# Patient Record
Sex: Male | Born: 1945
Health system: Southern US, Community
[De-identification: ages and names within clinical notes are randomized; demographics above are authoritative.]

## PROBLEM LIST (undated history)

## (undated) DIAGNOSIS — I10 Essential (primary) hypertension: Secondary | ICD-10-CM

## (undated) DIAGNOSIS — F329 Major depressive disorder, single episode, unspecified: Secondary | ICD-10-CM

## (undated) DIAGNOSIS — J449 Chronic obstructive pulmonary disease, unspecified: Secondary | ICD-10-CM

## (undated) DIAGNOSIS — R0600 Dyspnea, unspecified: Secondary | ICD-10-CM

## (undated) DIAGNOSIS — K219 Gastro-esophageal reflux disease without esophagitis: Secondary | ICD-10-CM

## (undated) DIAGNOSIS — F32A Depression, unspecified: Secondary | ICD-10-CM

## (undated) HISTORY — PX: EYE SURGERY: SHX253

## (undated) HISTORY — PX: BACK SURGERY: SHX140

## (undated) HISTORY — PX: SHOULDER ARTHROSCOPY: SHX128

---

## 2007-08-17 ENCOUNTER — Emergency Department (HOSPITAL_COMMUNITY): Admission: EM | Admit: 2007-08-17 | Discharge: 2007-08-18 | Payer: Self-pay | Admitting: Emergency Medicine

## 2007-08-20 ENCOUNTER — Encounter: Admission: RE | Admit: 2007-08-20 | Discharge: 2007-08-20 | Payer: Self-pay | Admitting: Orthopedic Surgery

## 2010-12-09 ENCOUNTER — Other Ambulatory Visit: Payer: Self-pay | Admitting: Internal Medicine

## 2010-12-09 DIAGNOSIS — K219 Gastro-esophageal reflux disease without esophagitis: Secondary | ICD-10-CM

## 2010-12-22 ENCOUNTER — Ambulatory Visit
Admission: RE | Admit: 2010-12-22 | Discharge: 2010-12-22 | Disposition: A | Discharge status: Home / Self Care | Payer: 59 | Source: Ambulatory Visit | Attending: Internal Medicine | Admitting: Internal Medicine

## 2010-12-22 DIAGNOSIS — K219 Gastro-esophageal reflux disease without esophagitis: Secondary | ICD-10-CM

## 2011-02-06 ENCOUNTER — Ambulatory Visit
Admission: RE | Admit: 2011-02-06 | Discharge: 2011-02-06 | Disposition: A | Discharge status: Home / Self Care | Payer: 59 | Source: Ambulatory Visit | Attending: Internal Medicine | Admitting: Internal Medicine

## 2011-02-06 ENCOUNTER — Other Ambulatory Visit: Payer: Self-pay | Admitting: Internal Medicine

## 2011-02-06 DIAGNOSIS — R109 Unspecified abdominal pain: Secondary | ICD-10-CM

## 2012-05-13 ENCOUNTER — Other Ambulatory Visit: Payer: Self-pay | Admitting: Gastroenterology

## 2012-06-06 ENCOUNTER — Encounter (HOSPITAL_COMMUNITY): Payer: Self-pay | Admitting: *Deleted

## 2012-06-07 NOTE — Progress Notes (Signed)
cmet dr Windy Fast polite Deboraha Sprang 05-30-2012 on chart

## 2012-06-11 ENCOUNTER — Encounter (HOSPITAL_COMMUNITY): Payer: Self-pay | Admitting: Pharmacy Technician

## 2012-06-25 ENCOUNTER — Ambulatory Visit (HOSPITAL_COMMUNITY)
Admission: RE | Admit: 2012-06-25 | Discharge: 2012-06-25 | Disposition: A | Discharge status: Home / Self Care | Payer: Medicare HMO | Source: Ambulatory Visit | Attending: Gastroenterology | Admitting: Gastroenterology

## 2012-06-25 ENCOUNTER — Encounter (HOSPITAL_COMMUNITY): Payer: Self-pay | Admitting: Anesthesiology

## 2012-06-25 ENCOUNTER — Ambulatory Visit (HOSPITAL_COMMUNITY): Payer: Medicare HMO | Admitting: Anesthesiology

## 2012-06-25 ENCOUNTER — Encounter (HOSPITAL_COMMUNITY)
Admission: RE | Disposition: A | Discharge status: Home / Self Care | Payer: Self-pay | Source: Ambulatory Visit | Attending: Gastroenterology

## 2012-06-25 ENCOUNTER — Encounter (HOSPITAL_COMMUNITY): Payer: Self-pay | Admitting: *Deleted

## 2012-06-25 DIAGNOSIS — J4489 Other specified chronic obstructive pulmonary disease: Secondary | ICD-10-CM | POA: Insufficient documentation

## 2012-06-25 DIAGNOSIS — K219 Gastro-esophageal reflux disease without esophagitis: Secondary | ICD-10-CM | POA: Insufficient documentation

## 2012-06-25 DIAGNOSIS — F329 Major depressive disorder, single episode, unspecified: Secondary | ICD-10-CM | POA: Insufficient documentation

## 2012-06-25 DIAGNOSIS — D126 Benign neoplasm of colon, unspecified: Secondary | ICD-10-CM | POA: Insufficient documentation

## 2012-06-25 DIAGNOSIS — I10 Essential (primary) hypertension: Secondary | ICD-10-CM | POA: Insufficient documentation

## 2012-06-25 DIAGNOSIS — G8929 Other chronic pain: Secondary | ICD-10-CM | POA: Insufficient documentation

## 2012-06-25 DIAGNOSIS — R109 Unspecified abdominal pain: Secondary | ICD-10-CM | POA: Insufficient documentation

## 2012-06-25 DIAGNOSIS — F3289 Other specified depressive episodes: Secondary | ICD-10-CM | POA: Insufficient documentation

## 2012-06-25 DIAGNOSIS — J449 Chronic obstructive pulmonary disease, unspecified: Secondary | ICD-10-CM | POA: Insufficient documentation

## 2012-06-25 DIAGNOSIS — F17200 Nicotine dependence, unspecified, uncomplicated: Secondary | ICD-10-CM | POA: Insufficient documentation

## 2012-06-25 DIAGNOSIS — R12 Heartburn: Secondary | ICD-10-CM | POA: Insufficient documentation

## 2012-06-25 HISTORY — DX: Gastro-esophageal reflux disease without esophagitis: K21.9

## 2012-06-25 HISTORY — PX: ESOPHAGOGASTRODUODENOSCOPY (EGD) WITH PROPOFOL: SHX5813

## 2012-06-25 HISTORY — DX: Essential (primary) hypertension: I10

## 2012-06-25 HISTORY — PX: COLONOSCOPY WITH PROPOFOL: SHX5780

## 2012-06-25 HISTORY — DX: Chronic obstructive pulmonary disease, unspecified: J44.9

## 2012-06-25 HISTORY — DX: Depression, unspecified: F32.A

## 2012-06-25 HISTORY — DX: Major depressive disorder, single episode, unspecified: F32.9

## 2012-06-25 SURGERY — COLONOSCOPY WITH PROPOFOL
Anesthesia: Monitor Anesthesia Care

## 2012-06-25 MED ORDER — GLYCOPYRROLATE 0.2 MG/ML IJ SOLN
INTRAMUSCULAR | Status: DC | PRN
  Administered 2012-06-25: 0.2 mg via INTRAVENOUS

## 2012-06-25 MED ORDER — BUTAMBEN-TETRACAINE-BENZOCAINE 2-2-14 % EX AERO
INHALATION_SPRAY | CUTANEOUS | Status: DC | PRN
  Administered 2012-06-25: 1 via TOPICAL

## 2012-06-25 MED ORDER — KETAMINE HCL 10 MG/ML IJ SOLN
INTRAMUSCULAR | Status: DC | PRN
  Administered 2012-06-25: 20 mg via INTRAVENOUS

## 2012-06-25 MED ORDER — PROPOFOL INFUSION 10 MG/ML OPTIME
INTRAVENOUS | Status: DC | PRN
  Administered 2012-06-25: 140 ug/kg/min via INTRAVENOUS

## 2012-06-25 MED ORDER — LACTATED RINGERS IV SOLN
INTRAVENOUS | Status: DC | PRN
  Administered 2012-06-25 (×2): via INTRAVENOUS

## 2012-06-25 MED ORDER — SODIUM CHLORIDE 0.9 % IV SOLN: INTRAVENOUS | Status: DC

## 2012-06-25 MED ORDER — LACTATED RINGERS IV SOLN
INTRAVENOUS | Status: DC
  Administered 2012-06-25: 1000 mL via INTRAVENOUS

## 2012-06-25 MED ORDER — PROPOFOL 10 MG/ML IV EMUL
INTRAVENOUS | Status: DC | PRN
  Administered 2012-06-25: 40 mg via INTRAVENOUS

## 2012-06-25 SURGICAL SUPPLY — 24 items

## 2012-06-25 NOTE — Op Note (Signed)
Procedure: Diagnostic esophagogastroduodenoscopy to evaluate chronic abdominal pain and heartburn  Endoscopist: Danise Edge  Premedication: Propofol administered by anesthesia  Procedure: The patient was placed in the left lateral decubitus position. The Pentax gastroscope was passed through the posterior hypopharynx into the proximal esophagus without difficulty. The hypopharynx, larynx, and vocal cords appeared normal.  Esophagoscopy: The proximal, mid, and lower segments of the esophageal mucosa appear normal. The squamocolumnar junction is noted at approximately 43 cm from the incisor teeth. There is no endoscopic evidence for the presence of erosive esophagitis or Barrett's esophagus.  Gastroscopy: Retroflex view of the gastric cardia and fundus was normal. The gastric body, antrum, and pylorus appeared normal. Random gastric biopsies were performed to look for H. pylori gastritis.  Duodenoscopy: The duodenal bulb and descending duodenum appeared normal.  Assessment: Normal esophagogastroduodenoscopy. Gastric biopsies to look for H. pylori gastritis pending.  Procedure: Diagnostic colonoscopy  Endoscopist: Danise Edge  Premedication: Propofol administered by anesthesia  Procedure: Anal inspection and digital rectal exam were normal. The Pentax pediatric colonoscope was introduced into the rectum and advanced to the cecum. A normal-appearing ileocecal valve and appendiceal orifice were identified. Colonic preparation for the exam today was good.  Rectum. Normal. Retroflex view of the distal rectum normal.  Sigmoid colon. From the mid sigmoid colon, a 7 mm sessile polyp was removed with the hot snare and submitted for pathologic interpretation.  Descending colon. Normal.  Splenic flexure. Normal.  Transverse colon. Normal.  Hepatic flexure. Normal.  Ascending colon. From the proximal ascending colon, a 7 mm sessile polyp was removed in piecemeal fashion with the cold  snare and submitted for pathologic interpretation.  Cecum and ileocecal valve. Normal.  Assessment:  #1. From the proximal ascending colon, a 7 mm sessile polyp was removed with the cold snare  #2. From the mid sigmoid colon, a 7 mm sessile polyp was removed with the hot snare   Recommendations: Await pathology report.

## 2012-06-25 NOTE — Transfer of Care (Signed)
Immediate Anesthesia Transfer of Care Note  Patient: Lee Leblanc  Procedure(s) Performed: Procedure(s): COLONOSCOPY WITH PROPOFOL (N/A) ESOPHAGOGASTRODUODENOSCOPY (EGD) WITH PROPOFOL (N/A)  Patient Location: PACU  Anesthesia Type:MAC  Level of Consciousness: awake, alert  and oriented  Airway & Oxygen Therapy: Patient Spontanous Breathing and Patient connected to nasal cannula oxygen  Post-op Assessment: Report given to PACU RN and Post -op Vital signs reviewed and stable  Post vital signs: Reviewed and stable  Complications: No apparent anesthesia complications

## 2012-06-25 NOTE — Preoperative (Signed)
Beta Blockers   Reason not to administer Beta Blockers:Not Applicable 

## 2012-06-25 NOTE — Anesthesia Preprocedure Evaluation (Addendum)
Anesthesia Evaluation  Patient identified by MRN, date of birth, ID band Patient awake    Reviewed: Allergy & Precautions, H&P , NPO status , Patient's Chart, lab work & pertinent test results  Airway Mallampati: II TM Distance: >3 FB Neck ROM: Full    Dental  (+) Edentulous Upper and Missing   Pulmonary COPDCurrent Smoker,  breath sounds clear to auscultation  Pulmonary exam normal       Cardiovascular Exercise Tolerance: Good hypertension, Pt. on medications Rhythm:Regular Rate:Normal     Neuro/Psych PSYCHIATRIC DISORDERS Depression Chronic back pain. Uses ms contin.    GI/Hepatic Neg liver ROS, GERD-  ,Chronic abdominal pain, heartburn, constipation.   Endo/Other  negative endocrine ROS  Renal/GU negative Renal ROS  negative genitourinary   Musculoskeletal negative musculoskeletal ROS (+)   Abdominal   Peds negative pediatric ROS (+)  Hematology negative hematology ROS (+)   Anesthesia Other Findings   Reproductive/Obstetrics negative OB ROS                          Anesthesia Physical Anesthesia Plan  ASA: II  Anesthesia Plan: MAC   Post-op Pain Management:    Induction: Intravenous  Airway Management Planned:   Additional Equipment:   Intra-op Plan:   Post-operative Plan:   Informed Consent: I have reviewed the patients History and Physical, chart, labs and discussed the procedure including the risks, benefits and alternatives for the proposed anesthesia with the patient or authorized representative who has indicated his/her understanding and acceptance.   Dental advisory given  Plan Discussed with: CRNA  Anesthesia Plan Comments:         Anesthesia Quick Evaluation

## 2012-06-25 NOTE — Anesthesia Postprocedure Evaluation (Signed)
  Anesthesia Post-op Note  Patient: Lee Leblanc  Procedure(s) Performed: Procedure(s) (LRB): COLONOSCOPY WITH PROPOFOL (N/A) ESOPHAGOGASTRODUODENOSCOPY (EGD) WITH PROPOFOL (N/A)  Patient Location: PACU  Anesthesia Type: MAC  Level of Consciousness: awake and alert   Airway and Oxygen Therapy: Patient Spontanous Breathing  Post-op Pain: mild  Post-op Assessment: Post-op Vital signs reviewed, Patient's Cardiovascular Status Stable, Respiratory Function Stable, Patent Airway and No signs of Nausea or vomiting  Last Vitals:  Filed Vitals:   06/25/12 1226  BP: 157/80  Pulse:   Temp:   Resp: 17    Post-op Vital Signs: stable   Complications: No apparent anesthesia complications

## 2012-06-25 NOTE — H&P (Signed)
  Problems: Chronic abdominal pain, heartburn, constipation  History: The patient is a 67 year old male born [birth year]. The patient has been experiencing postprandial periumbilical and upper abdominal discomfort associated with heartburn, constipation, and water brash. He denies gastrointestinal bleeding, dysphagia, odynophagia, or vomiting. He denies weight loss.  In October 2003, the patient underwent a normal esophagogastroduodenoscopy and screening colonoscopy. In 2006, he underwent an incomplete colonoscopy; I do not have a colonoscopy report. In 2006, he underwent a normal single contrast barium enema which did show left colonic diverticulosis. In November 2012, the patient underwent a normal barium esophagram. In December 2012, CT scan of the abdomen and pelvis showed bilateral pleural calcifications consistent with asbestos exposure, a right inguinal hernia, and right kidney cyst.  The patient is monitored on a yearly basis by his pulmonary specialist for his pleural effusions.  The patient is scheduled to undergo a diagnostic esophagogastroduodenoscopy with performance of gastric biopsies to rule out H. pyloric gastritis and also undergo a diagnostic/screening colonoscopy.  Chronic medications: Albuterol. Aspirin. Advair. Citalopram. Losartan. Hydrochlorothiazide.  MS Contin.  Past medical and surgical history: Hypertension. Chronic pain. Benign prostatic hypertrophy. Depression. Chronic obstructive pulmonary disease associated with tobacco use. Chronic insomnia. Chronic back pain following multiple back surgeries.  Medication allergies: None  Habits: The patient continues to smoke cigarettes and consumes alcohol in moderation.  Exam: The patient is alert and lying comfortably on the endoscopy stretcher. Lungs are clear to auscultation. Cardiac exam reveals a regular rhythm. Abdomen is soft, flat, and nontender to palpation in all quadrants.  Plan: proceed with diagnostic  esophagogastroduodenoscopy with performance of gastric biopsies and diagnostic colonoscopy to evaluate chronic abdominal pain, heartburn, and constipation.

## 2012-06-26 ENCOUNTER — Encounter (HOSPITAL_COMMUNITY): Payer: Self-pay | Admitting: Gastroenterology

## 2013-06-13 ENCOUNTER — Other Ambulatory Visit (HOSPITAL_COMMUNITY): Payer: Self-pay

## 2013-06-13 DIAGNOSIS — J441 Chronic obstructive pulmonary disease with (acute) exacerbation: Secondary | ICD-10-CM

## 2013-06-20 ENCOUNTER — Ambulatory Visit (HOSPITAL_COMMUNITY)
Admission: RE | Admit: 2013-06-20 | Discharge: 2013-06-20 | Disposition: A | Discharge status: Home / Self Care | Payer: Medicare HMO | Source: Ambulatory Visit | Attending: Internal Medicine | Admitting: Internal Medicine

## 2013-06-20 DIAGNOSIS — J4489 Other specified chronic obstructive pulmonary disease: Secondary | ICD-10-CM | POA: Insufficient documentation

## 2013-06-20 DIAGNOSIS — J449 Chronic obstructive pulmonary disease, unspecified: Secondary | ICD-10-CM | POA: Insufficient documentation

## 2013-06-20 MED ORDER — ALBUTEROL SULFATE (2.5 MG/3ML) 0.083% IN NEBU
2.5000 mg | INHALATION_SOLUTION | Freq: Once | RESPIRATORY_TRACT | Status: AC
  Administered 2013-06-20: 2.5 mg via RESPIRATORY_TRACT

## 2013-06-23 ENCOUNTER — Other Ambulatory Visit: Payer: Self-pay | Admitting: Internal Medicine

## 2013-06-23 ENCOUNTER — Ambulatory Visit
Admission: RE | Admit: 2013-06-23 | Discharge: 2013-06-23 | Disposition: A | Discharge status: Home / Self Care | Payer: Commercial Managed Care - HMO | Source: Ambulatory Visit | Attending: Internal Medicine | Admitting: Internal Medicine

## 2013-06-23 DIAGNOSIS — R0602 Shortness of breath: Secondary | ICD-10-CM

## 2013-06-24 ENCOUNTER — Other Ambulatory Visit: Payer: Self-pay | Admitting: Internal Medicine

## 2013-06-24 DIAGNOSIS — J61 Pneumoconiosis due to asbestos and other mineral fibers: Secondary | ICD-10-CM

## 2013-06-24 DIAGNOSIS — R9389 Abnormal findings on diagnostic imaging of other specified body structures: Secondary | ICD-10-CM

## 2013-06-26 LAB — PULMONARY FUNCTION TEST
DL/VA % pred: 71 %
DL/VA: 3.41 ml/min/mmHg/L
DLCO COR % PRED: 67 %
DLCO COR: 24.67 ml/min/mmHg
DLCO unc % pred: 67 %
DLCO unc: 24.67 ml/min/mmHg
FEF 25-75 POST: 3.13 L/s
FEF 25-75 PRE: 2.41 L/s
FEF2575-%CHANGE-POST: 29 %
FEF2575-%PRED-POST: 109 %
FEF2575-%PRED-PRE: 84 %
FEV1-%Change-Post: 5 %
FEV1-%PRED-PRE: 99 %
FEV1-%Pred-Post: 105 %
FEV1-POST: 3.48 L
FEV1-PRE: 3.29 L
FEV1FVC-%CHANGE-POST: 6 %
FEV1FVC-%PRED-PRE: 96 %
FEV6-%CHANGE-POST: 0 %
FEV6-%PRED-POST: 104 %
FEV6-%Pred-Pre: 105 %
FEV6-PRE: 4.37 L
FEV6-Post: 4.35 L
FEV6FVC-%Change-Post: 1 %
FEV6FVC-%Pred-Post: 103 %
FEV6FVC-%Pred-Pre: 102 %
FVC-%Change-Post: -1 %
FVC-%PRED-POST: 101 %
FVC-%Pred-Pre: 102 %
FVC-POST: 4.38 L
FVC-Pre: 4.43 L
POST FEV1/FVC RATIO: 79 %
POST FEV6/FVC RATIO: 100 %
PRE FEV1/FVC RATIO: 74 %
Pre FEV6/FVC Ratio: 99 %
RV % pred: 112 %
RV: 2.89 L
TLC % PRED: 98 %
TLC: 7.51 L

## 2013-06-27 ENCOUNTER — Ambulatory Visit
Admission: RE | Admit: 2013-06-27 | Discharge: 2013-06-27 | Disposition: A | Discharge status: Home / Self Care | Payer: Commercial Managed Care - HMO | Source: Ambulatory Visit | Attending: Internal Medicine | Admitting: Internal Medicine

## 2013-06-27 ENCOUNTER — Encounter (INDEPENDENT_AMBULATORY_CARE_PROVIDER_SITE_OTHER): Payer: Self-pay

## 2013-06-27 DIAGNOSIS — R9389 Abnormal findings on diagnostic imaging of other specified body structures: Secondary | ICD-10-CM

## 2013-06-27 DIAGNOSIS — J61 Pneumoconiosis due to asbestos and other mineral fibers: Secondary | ICD-10-CM

## 2013-07-02 ENCOUNTER — Other Ambulatory Visit: Payer: Self-pay | Admitting: Internal Medicine

## 2013-07-02 DIAGNOSIS — J61 Pneumoconiosis due to asbestos and other mineral fibers: Secondary | ICD-10-CM

## 2013-07-02 DIAGNOSIS — J438 Other emphysema: Secondary | ICD-10-CM

## 2013-07-02 DIAGNOSIS — R911 Solitary pulmonary nodule: Secondary | ICD-10-CM

## 2014-01-05 ENCOUNTER — Ambulatory Visit
Admission: RE | Admit: 2014-01-05 | Discharge: 2014-01-05 | Disposition: A | Discharge status: Home / Self Care | Payer: Commercial Managed Care - HMO | Source: Ambulatory Visit | Attending: Internal Medicine | Admitting: Internal Medicine

## 2014-01-05 DIAGNOSIS — J61 Pneumoconiosis due to asbestos and other mineral fibers: Secondary | ICD-10-CM

## 2014-01-05 DIAGNOSIS — J438 Other emphysema: Secondary | ICD-10-CM

## 2014-01-05 DIAGNOSIS — R911 Solitary pulmonary nodule: Secondary | ICD-10-CM

## 2014-01-13 ENCOUNTER — Ambulatory Visit (INDEPENDENT_AMBULATORY_CARE_PROVIDER_SITE_OTHER): Payer: Commercial Managed Care - HMO | Admitting: Internal Medicine

## 2014-01-13 ENCOUNTER — Encounter: Payer: Self-pay | Admitting: Internal Medicine

## 2014-01-13 ENCOUNTER — Other Ambulatory Visit (INDEPENDENT_AMBULATORY_CARE_PROVIDER_SITE_OTHER): Payer: Commercial Managed Care - HMO

## 2014-01-13 VITALS — BP 154/90 | HR 64 | Ht 74.0 in | Wt 219.0 lb

## 2014-01-13 DIAGNOSIS — R0600 Dyspnea, unspecified: Secondary | ICD-10-CM

## 2014-01-13 DIAGNOSIS — R911 Solitary pulmonary nodule: Secondary | ICD-10-CM

## 2014-01-13 DIAGNOSIS — J929 Pleural plaque without asbestos: Secondary | ICD-10-CM

## 2014-01-13 LAB — BRAIN NATRIURETIC PEPTIDE: PRO B NATRI PEPTIDE: 53 pg/mL (ref 0.0–100.0)

## 2014-01-13 LAB — TSH: TSH: 1.07 u[IU]/mL (ref 0.35–4.50)

## 2014-01-13 NOTE — Patient Instructions (Signed)
Pace yourself better so you don't get out of breath when you walk  Please remember to go to the lab   department downstairs for your tests - we will call you with the results when they are available.  I will be in touch with Dr Delfina Redwood re further testing but you do not have copd and ok to stop the symbicort as you don't think it helped your breathing

## 2014-01-13 NOTE — Progress Notes (Signed)
Subjective:    Patient ID: Lee Leblanc, male    DOB: 10-09-1945   MRN: [id number]  HPI  54  yobm  Variably "stopped smoking" 2013 @ wt 180  with onset doe worse since quit smoking assoc with 40 lb of wt gain   so referred by Dr Delfina Redwood to pulmonary clinic 01/13/2014  With doucmented nl pfts 06/20/13 but prior asbestos exp with pleural plaques noted on CTs since 02/06/11   01/13/2014 1st [facility] office visit/ Amandeep Hogston   Chief Complaint  Patient presents with  . Pulmonary Consult    Referred by Dr. Delfina Redwood. Pt c/o DOE for the past 2-3 yrs "worse lately".  He states that he gets winded walking approx 15 ft at a fast pace.     not really much better on symbicort- main sob occurs after goes to bathroom nightly then tries to lie back down, does not occur in am if goes to bathroom and stays up and only reproducible again if really gets in a hurry/ assoc with sign wt gain since onset of sob which was indolent and minimally progressive   No obvious other patterns in day to day or daytime variabilty or assoc  cough or cp or chest tightness, subjective wheeze overt sinus or hb symptoms. No unusual exp hx or h/o childhood pna/ asthma or knowledge of premature birth.  Sleeping ok without nocturnal  or early am exacerbation  of respiratory  c/o's or need for noct saba. Also denies any obvious fluctuation of symptoms with weather or environmental changes or other aggravating or alleviating factors except as outlined above   Current Medications, Allergies, Complete Past Medical History, Past Surgical History, Family History, and Social History were reviewed in [employer] record.           Review of Systems  Constitutional: Positive for appetite change. Negative for fever, chills, activity change and unexpected weight change.  HENT: Positive for congestion, dental problem and trouble swallowing. Negative for postnasal drip, rhinorrhea, sneezing, sore throat and voice change.     Eyes: Negative for visual disturbance.  Respiratory: Positive for shortness of breath. Negative for cough and choking.   Cardiovascular: Negative for chest pain and leg swelling.  Gastrointestinal: Negative for nausea, vomiting and abdominal pain.  Genitourinary: Negative for difficulty urinating.       Indigestion  Musculoskeletal: Positive for arthralgias.  Skin: Positive for rash.  Psychiatric/Behavioral: Negative for behavioral problems and confusion.       Objective:   Physical Exam  amb bm nad   Wt Readings from Last 3 Encounters:  01/13/14 219 lb (99.338 kg)  06/25/12 192 lb (87.091 kg)    Vital signs reviewed   HEENT:  top dentures/ nl turbinates, and orophanx. Nl external ear canals without cough reflex   NECK :  without JVD/Nodes/TM/ nl carotid upstrokes bilaterally   LUNGS: no acc muscle use, clear to A and P bilaterally without cough on insp or exp maneuvers   CV:  RRR  no s3 or murmur or increase in P2, no edema   ABD:  soft and nontender with nl excursion in the supine position. No bruits or organomegaly, bowel sounds nl  MS:  warm without deformities, calf tenderness, cyanosis or clubbing  SKIN: warm and dry without lesions    NEURO:  alert, approp, no deficits     ct chest s contrast  01/05/14 There is underlying centrilobular type emphysema. There is bullous disease in both apices. There are  multiple pleural calcifications as well as calcification along each hemidiaphragm consistent with underlying emphysematous change, also noted previously. Plus 6 mm SPN   01/08/14 labs from Dr Polite's office HC03 =32 Hct 44.6 WBC 6.4 with 7% eos    Lab Results  Component Value Date   TSH 1.07 01/13/2014     Lab Results  Component Value Date   PROBNP 53.0 01/13/2014             Assessment & Plan:

## 2014-01-14 ENCOUNTER — Telehealth: Payer: Self-pay | Admitting: Internal Medicine

## 2014-01-14 NOTE — Telephone Encounter (Signed)
Notes Recorded by Tanda Rockers, MD on 01/13/2014 at 5:04 PM Call patient : Study is unremarkable, no change in recs ---------------------------------------------------------  Made pt aware of TSH results.  Nothing further needed at this time.

## 2014-01-14 NOTE — Progress Notes (Signed)
Quick Note:  LMTCB ______ 

## 2014-01-14 NOTE — Progress Notes (Signed)
Quick Note:  Pt aware of results. ______ 

## 2014-01-15 ENCOUNTER — Telehealth: Payer: Self-pay | Admitting: *Deleted

## 2014-01-15 DIAGNOSIS — R911 Solitary pulmonary nodule: Secondary | ICD-10-CM | POA: Insufficient documentation

## 2014-01-15 DIAGNOSIS — J929 Pleural plaque without asbestos: Secondary | ICD-10-CM | POA: Insufficient documentation

## 2014-01-15 NOTE — Telephone Encounter (Signed)
-----   Message from Tanda Rockers, MD sent at 01/15/2014  8:35 AM EST ----- After chart review rec add pepcid trial 20 mg at bedtime No mint/menthol/ chocolate/oil containing vitamins and eat smaller meal at supper earlier in day to see if helps noct problems and return to Dr Delfina Redwood p holidays to regroup  (I sent him additoinal recs to consider if above doesn't help)

## 2014-01-15 NOTE — Assessment & Plan Note (Addendum)
-   PFT's 06/20/13 no airflow obst, no better on symbicort > d/c 01/12/14  -  01/13/2014  Walked RA x 3 laps @ 185 ft each stopped due to min sob/ no desat/ nl to fast pace   Symptoms are markedly disproportionate to objective findings and not clear this is a lung problem but pt does appear to have difficult airway management issues.  DDX of  difficult airways management all start with A and  include Adherence, Ace Inhibitors, Acid Reflux, Active Sinus Disease, Alpha 1 Antitripsin deficiency, Anxiety masquerading as Airways dz,  ABPA,  allergy(esp in young), Aspiration (esp in elderly), Adverse effects of DPI,  Active smokers, plus two Bs  = Bronchiectasis and Beta blocker use..and one C= CHF   ? Anxiety > can't explain why he has nightly "orthopnea" only after walks to [place] and back when pnd does not wake him up and bnp is nl but anxiety could certainly explain it as could effect of obesity on his diaphragm (note the high bicarb on bmet is suggestive of incipient ohs)> consider sleep study to sort out  ? Acid reflux noct > rec try pepcid 20 mg at hs   ? Active smoking > he denies it now but variably reports smoking > reinforced abstinence   No evidence at all of asthma or copd so ok to stop symbicort   Pulmonary f/u is prn

## 2014-01-15 NOTE — Assessment & Plan Note (Signed)
He has no asbestosis but is at risk and note needs f/u CT chest in one year anyway so that's all i would rec for now  Stopping smoking permanently is critical in this setting

## 2014-01-15 NOTE — Telephone Encounter (Signed)
Spoke with the pt and notified of recs per MW  He verbalized understanding and denied any questions

## 2014-01-15 NOTE — Assessment & Plan Note (Signed)
See CT chest Jun 27 2013 vs 01/05/14 = 6 mm sup segment RLL . rec f/u 01/06/15 per Fleischner Society recommendations for incidental pulmonary nodules   Placed in tickle

## 2014-05-25 ENCOUNTER — Emergency Department (HOSPITAL_COMMUNITY): Payer: Commercial Managed Care - HMO

## 2014-05-25 ENCOUNTER — Encounter (HOSPITAL_COMMUNITY): Payer: Self-pay | Admitting: Emergency Medicine

## 2014-05-25 DIAGNOSIS — J439 Emphysema, unspecified: Secondary | ICD-10-CM | POA: Diagnosis not present

## 2014-05-25 DIAGNOSIS — Z87891 Personal history of nicotine dependence: Secondary | ICD-10-CM | POA: Insufficient documentation

## 2014-05-25 DIAGNOSIS — R911 Solitary pulmonary nodule: Secondary | ICD-10-CM | POA: Diagnosis not present

## 2014-05-25 DIAGNOSIS — Z77090 Contact with and (suspected) exposure to asbestos: Secondary | ICD-10-CM | POA: Diagnosis not present

## 2014-05-25 DIAGNOSIS — I10 Essential (primary) hypertension: Secondary | ICD-10-CM | POA: Diagnosis not present

## 2014-05-25 DIAGNOSIS — E876 Hypokalemia: Secondary | ICD-10-CM | POA: Diagnosis not present

## 2014-05-25 DIAGNOSIS — Z806 Family history of leukemia: Secondary | ICD-10-CM | POA: Diagnosis not present

## 2014-05-25 DIAGNOSIS — N289 Disorder of kidney and ureter, unspecified: Secondary | ICD-10-CM | POA: Diagnosis not present

## 2014-05-25 DIAGNOSIS — Z7982 Long term (current) use of aspirin: Secondary | ICD-10-CM | POA: Insufficient documentation

## 2014-05-25 DIAGNOSIS — I309 Acute pericarditis, unspecified: Secondary | ICD-10-CM | POA: Diagnosis not present

## 2014-05-25 DIAGNOSIS — K219 Gastro-esophageal reflux disease without esophagitis: Secondary | ICD-10-CM | POA: Insufficient documentation

## 2014-05-25 DIAGNOSIS — R0602 Shortness of breath: Secondary | ICD-10-CM | POA: Diagnosis not present

## 2014-05-25 DIAGNOSIS — Z8042 Family history of malignant neoplasm of prostate: Secondary | ICD-10-CM | POA: Insufficient documentation

## 2014-05-25 DIAGNOSIS — F329 Major depressive disorder, single episode, unspecified: Secondary | ICD-10-CM | POA: Diagnosis not present

## 2014-05-25 DIAGNOSIS — R079 Chest pain, unspecified: Secondary | ICD-10-CM | POA: Diagnosis not present

## 2014-05-25 DIAGNOSIS — J449 Chronic obstructive pulmonary disease, unspecified: Secondary | ICD-10-CM | POA: Diagnosis not present

## 2014-05-25 DIAGNOSIS — R0789 Other chest pain: Secondary | ICD-10-CM | POA: Diagnosis not present

## 2014-05-25 LAB — BASIC METABOLIC PANEL
Anion gap: 9 (ref 5–15)
BUN: 13 mg/dL (ref 6–23)
CALCIUM: 9.1 mg/dL (ref 8.4–10.5)
CO2: 23 mmol/L (ref 19–32)
Chloride: 107 mmol/L (ref 96–112)
Creatinine, Ser: 1.31 mg/dL (ref 0.50–1.35)
GFR calc Af Amer: 63 mL/min — ABNORMAL LOW (ref >90)
GFR calc non Af Amer: 54 mL/min — ABNORMAL LOW (ref >90)
Glucose, Bld: 87 mg/dL (ref 70–99)
Potassium: 3.4 mmol/L — ABNORMAL LOW (ref 3.5–5.1)
SODIUM: 139 mmol/L (ref 135–145)

## 2014-05-25 LAB — CBC
HCT: 39.6 % (ref 39.0–52.0)
Hemoglobin: 13.8 g/dL (ref 13.0–17.0)
MCH: 30.7 pg (ref 26.0–34.0)
MCHC: 34.8 g/dL (ref 30.0–36.0)
MCV: 88 fL (ref 78.0–100.0)
Platelets: 299 10*3/uL (ref 150–400)
RBC: 4.5 MIL/uL (ref 4.22–5.81)
RDW: 14.4 % (ref 11.5–15.5)
WBC: 6.2 10*3/uL (ref 4.0–10.5)

## 2014-05-25 LAB — BRAIN NATRIURETIC PEPTIDE: B Natriuretic Peptide: 27.1 pg/mL (ref 0.0–100.0)

## 2014-05-25 LAB — I-STAT TROPONIN, ED: TROPONIN I, POC: 0 ng/mL (ref 0.00–0.08)

## 2014-05-25 NOTE — ED Notes (Signed)
Pt. reports intermittent left chest pain with SOB onset this afternoon , received  NTG 1 sl prior to arrival with slight relief. Denies nausea or diaphoresis .

## 2014-05-26 ENCOUNTER — Observation Stay (HOSPITAL_COMMUNITY): Payer: Commercial Managed Care - HMO

## 2014-05-26 ENCOUNTER — Observation Stay (HOSPITAL_COMMUNITY)
Admission: EM | Admit: 2014-05-26 | Discharge: 2014-05-27 | Disposition: A | Discharge status: Home / Self Care | Payer: Commercial Managed Care - HMO | Attending: Internal Medicine | Admitting: Internal Medicine

## 2014-05-26 DIAGNOSIS — R911 Solitary pulmonary nodule: Secondary | ICD-10-CM | POA: Diagnosis not present

## 2014-05-26 DIAGNOSIS — J929 Pleural plaque without asbestos: Secondary | ICD-10-CM | POA: Diagnosis not present

## 2014-05-26 DIAGNOSIS — I10 Essential (primary) hypertension: Secondary | ICD-10-CM | POA: Diagnosis not present

## 2014-05-26 DIAGNOSIS — E876 Hypokalemia: Secondary | ICD-10-CM | POA: Diagnosis not present

## 2014-05-26 DIAGNOSIS — N289 Disorder of kidney and ureter, unspecified: Secondary | ICD-10-CM | POA: Diagnosis not present

## 2014-05-26 DIAGNOSIS — R0609 Other forms of dyspnea: Secondary | ICD-10-CM | POA: Diagnosis not present

## 2014-05-26 DIAGNOSIS — K219 Gastro-esophageal reflux disease without esophagitis: Secondary | ICD-10-CM | POA: Diagnosis not present

## 2014-05-26 DIAGNOSIS — J439 Emphysema, unspecified: Secondary | ICD-10-CM | POA: Diagnosis present

## 2014-05-26 DIAGNOSIS — R079 Chest pain, unspecified: Secondary | ICD-10-CM | POA: Diagnosis present

## 2014-05-26 DIAGNOSIS — R0600 Dyspnea, unspecified: Secondary | ICD-10-CM | POA: Diagnosis present

## 2014-05-26 DIAGNOSIS — I309 Acute pericarditis, unspecified: Secondary | ICD-10-CM | POA: Diagnosis not present

## 2014-05-26 DIAGNOSIS — F329 Major depressive disorder, single episode, unspecified: Secondary | ICD-10-CM | POA: Diagnosis not present

## 2014-05-26 DIAGNOSIS — Z7982 Long term (current) use of aspirin: Secondary | ICD-10-CM | POA: Diagnosis not present

## 2014-05-26 DIAGNOSIS — R071 Chest pain on breathing: Secondary | ICD-10-CM | POA: Diagnosis not present

## 2014-05-26 DIAGNOSIS — J432 Centrilobular emphysema: Secondary | ICD-10-CM | POA: Diagnosis not present

## 2014-05-26 LAB — TROPONIN I

## 2014-05-26 MED ORDER — HYDROCHLOROTHIAZIDE 25 MG PO TABS
25.0000 mg | ORAL_TABLET | Freq: Every day | ORAL | Status: DC
  Administered 2014-05-26 – 2014-05-27 (×2): 25 mg via ORAL
  Filled 2014-05-26 (×2): qty 1

## 2014-05-26 MED ORDER — ONDANSETRON HCL 4 MG/2ML IJ SOLN: 4.0000 mg | Freq: Four times a day (QID) | INTRAMUSCULAR | Status: DC | PRN

## 2014-05-26 MED ORDER — MORPHINE SULFATE ER 30 MG PO TBCR
30.0000 mg | EXTENDED_RELEASE_TABLET | Freq: Two times a day (BID) | ORAL | Status: DC
  Administered 2014-05-26 – 2014-05-27 (×3): 30 mg via ORAL
  Filled 2014-05-26 (×3): qty 1

## 2014-05-26 MED ORDER — HEPARIN SODIUM (PORCINE) 5000 UNIT/ML IJ SOLN
5000.0000 [IU] | Freq: Three times a day (TID) | INTRAMUSCULAR | Status: DC
  Administered 2014-05-26 – 2014-05-27 (×4): 5000 [IU] via SUBCUTANEOUS
  Filled 2014-05-26 (×3): qty 1

## 2014-05-26 MED ORDER — LOSARTAN POTASSIUM-HCTZ 100-25 MG PO TABS: 1.0000 | ORAL_TABLET | Freq: Every day | ORAL | Status: DC

## 2014-05-26 MED ORDER — LORATADINE 10 MG PO TABS
10.0000 mg | ORAL_TABLET | Freq: Every day | ORAL | Status: DC
  Administered 2014-05-26 – 2014-05-27 (×2): 10 mg via ORAL
  Filled 2014-05-26 (×2): qty 1

## 2014-05-26 MED ORDER — ACETAMINOPHEN 325 MG PO TABS: 650.0000 mg | ORAL_TABLET | ORAL | Status: DC | PRN

## 2014-05-26 MED ORDER — ASPIRIN EC 81 MG PO TBEC
81.0000 mg | DELAYED_RELEASE_TABLET | Freq: Every day | ORAL | Status: DC
  Administered 2014-05-26 – 2014-05-27 (×2): 81 mg via ORAL
  Filled 2014-05-26 (×2): qty 1

## 2014-05-26 MED ORDER — ASPIRIN 81 MG PO CHEW
324.0000 mg | CHEWABLE_TABLET | Freq: Once | ORAL | Status: AC
  Administered 2014-05-26: 324 mg via ORAL
  Filled 2014-05-26: qty 4

## 2014-05-26 MED ORDER — DOCUSATE SODIUM 100 MG PO CAPS
100.0000 mg | ORAL_CAPSULE | Freq: Once | ORAL | Status: AC
  Administered 2014-05-26: 100 mg via ORAL
  Filled 2014-05-26: qty 1

## 2014-05-26 MED ORDER — REGADENOSON 0.4 MG/5ML IV SOLN
0.4000 mg | Freq: Once | INTRAVENOUS | Status: DC
  Filled 2014-05-26: qty 5

## 2014-05-26 MED ORDER — BUDESONIDE-FORMOTEROL FUMARATE 160-4.5 MCG/ACT IN AERO
2.0000 | INHALATION_SPRAY | Freq: Two times a day (BID) | RESPIRATORY_TRACT | Status: DC
  Administered 2014-05-26 – 2014-05-27 (×2): 2 via RESPIRATORY_TRACT
  Filled 2014-05-26 (×2): qty 6

## 2014-05-26 MED ORDER — CITALOPRAM HYDROBROMIDE 40 MG PO TABS
40.0000 mg | ORAL_TABLET | Freq: Every day | ORAL | Status: DC
  Administered 2014-05-26 – 2014-05-27 (×2): 40 mg via ORAL
  Filled 2014-05-26 (×2): qty 1

## 2014-05-26 MED ORDER — GADOBENATE DIMEGLUMINE 529 MG/ML IV SOLN
35.0000 mL | Freq: Once | INTRAVENOUS | Status: AC | PRN
  Administered 2014-05-26: 35 mL via INTRAVENOUS

## 2014-05-26 MED ORDER — LOSARTAN POTASSIUM 50 MG PO TABS
100.0000 mg | ORAL_TABLET | Freq: Every day | ORAL | Status: DC
  Administered 2014-05-26 – 2014-05-27 (×2): 100 mg via ORAL
  Filled 2014-05-26 (×2): qty 2

## 2014-05-26 NOTE — H&P (Signed)
[facility] History and Physical  Lee Leblanc IOX:[id number] DOB: 17-Jan-1946 DOA: 05/26/2014  Referring physician: EDP PCP: Lee Hams, MD   Chief Complaint: Chest pain   HPI: Lee Leblanc is a 70 y.o. male who presents to the ED with c/o sudden onset, intermittent, and ongoing L sided chest pain that onset 2pm yesterday afternoon.  Pain is sharp, episodes last a few min at a time.  Episodes seem to be triggered by taking a deep breath but not with all deep breaths.  Nothing makes symptoms better.  Seen by PCP today, given 1 NTG, and sent to ED.  No h/o chest pain.  Does have history of pulmonary nodule in November of last year as well as longstanding pleural plaques from asbestos exposure in the past.  Patient has a 50 py history of smoking as well.  Review of Systems: Systems reviewed.  As above, otherwise negative  Past Medical History  Diagnosis Date  . Hypertension   . Depression   . GERD (gastroesophageal reflux disease)   . COPD (chronic obstructive pulmonary disease)     has not used albuterol inhaler for last 2 months   Past Surgical History  Procedure Laterality Date  . Back surgery    lasdt 1990's    lower x 7  . Colonoscopy with propofol N/A 06/25/2012    Procedure: COLONOSCOPY WITH PROPOFOL;  Surgeon: Lee Fair, MD;  Location: [facility];  Service: Endoscopy;  Laterality: N/A;  . Esophagogastroduodenoscopy (egd) with propofol N/A 06/25/2012    Procedure: ESOPHAGOGASTRODUODENOSCOPY (EGD) WITH PROPOFOL;  Surgeon: Lee Fair, MD;  Location: [facility];  Service: Endoscopy;  Laterality: N/A;   Social History:  reports that he quit smoking about 3 years ago. His smoking use included Cigarettes. He has a 25 pack-year smoking history. He has never used smokeless tobacco. He reports that he drinks alcohol. He reports that he does not use illicit drugs.  No Known Allergies  Family History  Problem Relation Age of Onset  . Allergies Daughter   .  Allergies Son   . Leukemia Sister   . Prostate cancer Father      Prior to Admission medications   Medication Sig Start Date End Date Taking? Authorizing Provider  aspirin 81 MG tablet Take 81 mg by mouth daily.   Yes Historical Provider, MD  budesonide-formoterol (SYMBICORT) 160-4.5 MCG/ACT inhaler Inhale 2 puffs into the lungs 2 (two) times daily.   Yes Historical Provider, MD  cetirizine (ZYRTEC) 10 MG tablet Take 10 mg by mouth daily.   Yes Historical Provider, MD  citalopram (CELEXA) 40 MG tablet Take 40 mg by mouth daily.   Yes Historical Provider, MD  losartan-hydrochlorothiazide (HYZAAR) 100-25 MG per tablet Take 1 tablet by mouth daily.   Yes Historical Provider, MD  morphine (MS CONTIN) 30 MG 12 hr tablet Take 30 mg by mouth 2 (two) times daily.   Yes Historical Provider, MD   Physical Exam: Filed Vitals:   05/26/14 0145  BP: 143/78  Pulse: 57  Temp:   Resp: 8    BP 143/78 mmHg  Pulse 57  Temp(Src) 98.1 F (36.7 C) (Oral)  Resp 8  Ht '6\' 1"'$  (1.854 m)  Wt 97.07 kg (214 lb)  BMI 28.24 kg/m2  SpO2 99%  General Appearance:    Alert, oriented, no distress, appears stated age  Head:    Normocephalic, atraumatic  Eyes:    PERRL, EOMI, sclera non-icteric        Nose:  Nares without drainage or epistaxis. Mucosa, turbinates normal  Throat:   Moist mucous membranes. Oropharynx without erythema or exudate.  Neck:   Supple. No carotid bruits.  No thyromegaly.  No lymphadenopathy.   Back:     No CVA tenderness, no spinal tenderness  Lungs:     Clear to auscultation bilaterally, without wheezes, rhonchi or rales  Chest wall:    No tenderness to palpitation  Heart:    Regular rate and rhythm without murmurs, gallops, rubs  Abdomen:     Soft, non-tender, nondistended, normal bowel sounds, no organomegaly  Genitalia:    deferred  Rectal:    deferred  Extremities:   No clubbing, cyanosis or edema.  Pulses:   2+ and symmetric all extremities  Skin:   Skin color, texture,  turgor normal, no rashes or lesions  Lymph nodes:   Cervical, supraclavicular, and axillary nodes normal  Neurologic:   CNII-XII intact. Normal strength, sensation and reflexes      throughout    Labs on Admission:  Basic Metabolic Panel:  Recent Labs Lab 05/25/14 2214  NA 139  K 3.4*  CL 107  CO2 23  GLUCOSE 87  BUN 13  CREATININE 1.31  CALCIUM 9.1   Liver Function Tests: No results for input(s): AST, ALT, ALKPHOS, BILITOT, PROT, ALBUMIN in the last 168 hours. No results for input(s): LIPASE, AMYLASE in the last 168 hours. No results for input(s): AMMONIA in the last 168 hours. CBC:  Recent Labs Lab 05/25/14 2214  WBC 6.2  HGB 13.8  HCT 39.6  MCV 88.0  PLT 299   Cardiac Enzymes: No results for input(s): CKTOTAL, CKMB, CKMBINDEX, TROPONINI in the last 168 hours.  BNP (last 3 results)  Recent Labs  01/13/14 1226  PROBNP 53.0   CBG: No results for input(s): GLUCAP in the last 168 hours.  Radiological Exams on Admission: Dg Chest 2 View  05/25/2014   CLINICAL DATA:  Hypertension, recurring left-sided chest pain. Abnormal EKG.  EXAM: CHEST  2 VIEW  COMPARISON:  01/05/2014 CT, 06/23/2013 radiograph  FINDINGS: Mild aortic tortuosity. Normal heart size. Mediastinal contours within normal range. Calcifications along the hemidiaphragms, in keeping with sequelae of prior asbestos exposure. Apical bulla. No confluent airspace opacity, pleural effusion, or pneumothorax. Mild multilevel degenerative change.  IMPRESSION: COPD and sequelae of prior asbestos exposure. No radiographic evidence of active cardiopulmonary disease.   Electronically Signed   By: Lee Leblanc M.D.   On: 05/25/2014 23:52    EKG: Independently reviewed.  Assessment/Plan Principal Problem:   Chest pain Active Problems:   Solitary pulmonary nodule   Pleural plaque without asbestosis   1. Chest pain - 1. Given pleuritic nature of chest pain, and patients very elevated risk of lung cancer due  to combined smoking and asbestos exposure, will order CT chest non-contrast. 2. Chest pain obs protocol 3. Tele monitor 4. HEART score = 4 5. Serial trops 6. NPO after midnight.    Code Status: Full Code  Family Communication: Wife at bedside Disposition Plan: Admit to obs   Time spent: 70 min  Elga Santy M. [facility] Pager [phone]  If 7AM-7PM, please contact the day team taking care of the patient [email or web address] Password [email or web address] 05/26/2014, 2:06 AM

## 2014-05-26 NOTE — Progress Notes (Signed)
Patient seen and evaluated earlier this a.m. by my associate. Cardiology currently on board and plans are for further evaluation with stress MRI.  Physical exam Gen.: Patient in no acute distress alert and awake CV: S1 and S2 present, no rubs Musculoskeletal: No reproducible chest discomfort on palpation  Will plan on reassessing patient next am.  Velvet Bathe

## 2014-05-26 NOTE — Care Management Note (Unsigned)
    Page 1 of 1   05/26/2014     2:03:21 PM CARE MANAGEMENT NOTE 05/26/2014  Patient:  Lee Leblanc, Lee Leblanc   Account Number:  [id number]  Date Initiated:  05/26/2014  Documentation initiated by:  Zygmunt Mcglinn  Subjective/Objective Assessment:   Pt adm on 05/25/14 with chest pain.  PTA, pt independent, lives with spouse.     Action/Plan:   Will follow for dc needs as pt progresses.   Anticipated DC Date:  05/27/2014   Anticipated DC Plan:  [place]  CM consult      Choice offered to / List presented to:             Status of service:  In process, will continue to follow Medicare Important Message given?   (If response is "NO", the following Medicare IM given date fields will be blank) Date Medicare IM given:   Medicare IM given by:   Date Additional Medicare IM given:   Additional Medicare IM given by:    Discharge Disposition:    Per UR Regulation:  Reviewed for med. necessity/level of care/duration of stay  If discussed at [place] of Stay Meetings, dates discussed:    Comments:

## 2014-05-26 NOTE — Progress Notes (Signed)
Pt has not had BM since 4/15, pt states he wants a stool softener, does not want laxative. Dr. Hal Hope paged, new order for Colace '100mg'$  once.

## 2014-05-26 NOTE — Progress Notes (Signed)
UR completed 

## 2014-05-26 NOTE — Progress Notes (Signed)
I cosign all documentation and medication administration by Tina Griffiths, Student RN. Ronnette Hila, RN

## 2014-05-26 NOTE — Consult Note (Signed)
CARDIOLOGY CONSULT NOTE   Patient ID: Daaiel Starlin MRN: [id number], DOB/AGE: 1945/04/21   Admit date: 05/26/2014 Date of Consult: 05/26/2014  Primary Physician: Kandice Hams, MD Primary Cardiologist: none  Reason for consult:  Chest pain  Problem List  Past Medical History  Diagnosis Date  . Hypertension   . Depression   . GERD (gastroesophageal reflux disease)   . COPD (chronic obstructive pulmonary disease)     has not used albuterol inhaler for last 2 months    Past Surgical History  Procedure Laterality Date  . Back surgery    lasdt 1990's    lower x 7  . Colonoscopy with propofol N/A 06/25/2012    Procedure: COLONOSCOPY WITH PROPOFOL;  Surgeon: Garlan Fair, MD;  Location: [facility];  Service: Endoscopy;  Laterality: N/A;  . Esophagogastroduodenoscopy (egd) with propofol N/A 06/25/2012    Procedure: ESOPHAGOGASTRODUODENOSCOPY (EGD) WITH PROPOFOL;  Surgeon: Garlan Fair, MD;  Location: [facility];  Service: Endoscopy;  Laterality: N/A;     Allergies  No Known Allergies  HPI   Naveed Humphres is a 69 y.o. male who presents to the ED with c/o sudden onset, intermittent, and ongoing left sided chest pain that onset 2pm yesterday afternoon. Pain is sharp, episodes last a few minutes at a time. Episodes seem to be triggered by taking a deep breath but not with all deep breaths. Nothing makes symptoms better. Seen by PCP today, given 1 NTG, and sent to ED. No h/o chest pain. Does have history of pulmonary nodule in November of last year as well as longstanding pleural plaques from asbestos exposure in the past. The patient states that he has been experiencing dyspnea on minimal exertion, he has a 50 py history of smoking as well. He was seen by Dr Melvyn Novas in 01/2014 that stated that he has no evidence of COPD or asthma.  His father had heart disease, but he doesn't know any more details, his brother has a pacemaker.   Inpatient Medications  . aspirin EC   81 mg Oral Daily  . budesonide-formoterol  2 puff Inhalation BID  . citalopram  40 mg Oral Daily  . heparin  5,000 Units Subcutaneous 3 times per day  . losartan  100 mg Oral Daily   And  . hydrochlorothiazide  25 mg Oral Daily  . loratadine  10 mg Oral Daily  . morphine  30 mg Oral BID    Family History Family History  Problem Relation Age of Onset  . Allergies Daughter   . Allergies Son   . Leukemia Sister   . Prostate cancer Father      Social History History   Social History  . Marital Status: Married    Spouse Name: N/A  . Number of Children: N/A  . Years of Education: N/A   Occupational History  . Retired    Social History Main Topics  . Smoking status: Former Smoker -- 0.50 packs/day for 50 years    Types: Cigarettes    Quit date: 02/07/2011  . Smokeless tobacco: Never Used  . Alcohol Use: 0.0 oz/week    0 Standard drinks or equivalent per week     Comment: occasional  . Drug Use: No  . Sexual Activity: Not on file   Other Topics Concern  . Not on file   Social History Narrative     Review of Systems  General:  No chills, fever, night sweats or weight changes.  Cardiovascular:  No chest pain,  dyspnea on exertion, edema, orthopnea, palpitations, paroxysmal nocturnal dyspnea. Dermatological: No rash, lesions/masses Respiratory: No cough, dyspnea Urologic: No hematuria, dysuria Abdominal:   No nausea, vomiting, diarrhea, bright red blood per rectum, melena, or hematemesis Neurologic:  No visual changes, wkns, changes in mental status. All other systems reviewed and are otherwise negative except as noted above.  Physical Exam  Blood pressure 131/73, pulse 58, temperature 97.6 F (36.4 C), temperature source Oral, resp. rate 18, height '6\' 1"'$  (1.854 m), weight 209 lb 6.4 oz (94.983 kg), SpO2 97 %.  General: Pleasant, NAD Psych: Normal affect. Neuro: Alert and oriented X 3. Moves all extremities spontaneously. HEENT: Normal  Neck: Supple without  bruits or JVD. Lungs:  Resp regular and unlabored, CTA. Heart: RRR no s3, s4, or murmurs. Abdomen: Soft, non-tender, non-distended, BS + x 4.  Extremities: No clubbing, cyanosis or edema. DP/PT/Radials 2+ and equal bilaterally.  Labs   Recent Labs  05/26/14 0422 05/26/14 0627  TROPONINI <0.03 <0.03   Lab Results  Component Value Date   WBC 6.2 05/25/2014   HGB 13.8 05/25/2014   HCT 39.6 05/25/2014   MCV 88.0 05/25/2014   PLT 299 05/25/2014    Recent Labs Lab 05/25/14 2214  NA 139  K 3.4*  CL 107  CO2 23  BUN 13  CREATININE 1.31  CALCIUM 9.1  GLUCOSE 87   No results found for: CHOL, HDL, LDLCALC, TRIG No results found for: DDIMER Invalid input(s): POCBNP  Radiology/Studies  Dg Chest 2 View  05/25/2014   CLINICAL DATA:  Hypertension, recurring left-sided chest pain. Abnormal EKG.   IMPRESSION: COPD and sequelae of prior asbestos exposure. No radiographic evidence of active cardiopulmonary disease.     Ct Chest Wo Contrast  05/26/2014   CLINICAL DATA:  Left-sided chest pain.  Smoker.  IMPRESSION: No acute intrathoracic process. Centrilobular emphysema and sequelae of prior asbestos exposure.  7 mm nodule within the right lower lobe is similar to prior. 4 mm lingular opacity, not appreciated on prior. Recommend 3- 6 month chest CT follow-up.   Electronically Signed   By: Carlos Levering M.D.   On: 05/26/2014 06:40   Echocardiogram - none  ECG: Sinus bradycardia, otherwise normal      ASSESSMENT AND PLAN  69 year old male   1. Atypical chest pain - negative troponin and normal ECG, symptoms more consistent with pericarditis, but also has DOE, normal PFTs 6 months ago. I would proceed with a stress MRI that would be able to evaluate for ischemia, prior scar, heart chamber quantification and also evaluate for pericarditis.  There is no rub of physical exam.   2. HTN - controlled on current regimen  3. Lipids - unknown, we will check   Signed, Dorothy Spark, MD, [facility] 05/26/2014, 9:56 AM

## 2014-05-26 NOTE — ED Notes (Signed)
Attempted report 

## 2014-05-26 NOTE — ED Provider Notes (Signed)
CSN: [id number]     Arrival date & time 05/25/14  2123 History   None    This chart was scribed for Lee Rice, MD by Forrestine Him, ED Scribe. This patient was seen in room A01C/A01C and the patient's care was started 1:24 AM.   Chief Complaint  Patient presents with  . Chest Pain   The history is provided by the patient. No language interpreter was used.    HPI Comments: Lee Leblanc is a 69 y.o. male with a PMHx of HTN, GERD, and COPD who presents to the Emergency Department complaining of sudden onset, intermittent, ongoing L sided chest pain onset 2:00 PM yesterday afternoon 4/18. Pain described as sharp. Episodes last for a few minutes at a time. Discomfort is exacerbated with deep breathing without any alleviating factors. Pt was seen by his PCP at [facility] earlier today for this complain. He was given 1 dose of Nitro with resolution of pain and was sent over to department. [name] also reports ongoing shortness and breath which has been intermittent prior to episode of chest pain this evening. No previous history of chest pain. No history of heart issues. Pt admits to cardiac stress testing several years ago. PSHx includes back surgery in 1990's. He is a former smoker of 50 years smoking 0.5 pack a day and recently quit 2 years ago. No known allergies to medications.  Pt is followed by Kandice Hams, MD- PCP  Past Medical History  Diagnosis Date  . Hypertension   . Depression   . GERD (gastroesophageal reflux disease)   . COPD (chronic obstructive pulmonary disease)     has not used albuterol inhaler for last 2 months   Past Surgical History  Procedure Laterality Date  . Back surgery    lasdt 1990's    lower x 7  . Colonoscopy with propofol N/A 06/25/2012    Procedure: COLONOSCOPY WITH PROPOFOL;  Surgeon: Garlan Fair, MD;  Location: [facility];  Service: Endoscopy;  Laterality: N/A;  . Esophagogastroduodenoscopy (egd) with propofol N/A 06/25/2012     Procedure: ESOPHAGOGASTRODUODENOSCOPY (EGD) WITH PROPOFOL;  Surgeon: Garlan Fair, MD;  Location: [facility];  Service: Endoscopy;  Laterality: N/A;   Family History  Problem Relation Age of Onset  . Allergies Daughter   . Allergies Son   . Leukemia Sister   . Prostate cancer Father    History  Substance Use Topics  . Smoking status: Former Smoker -- 0.50 packs/day for 50 years    Types: Cigarettes    Quit date: 02/07/2011  . Smokeless tobacco: Never Used  . Alcohol Use: 0.0 oz/week    0 Standard drinks or equivalent per week     Comment: occasional    Review of Systems  Constitutional: Negative for fever and chills.  Respiratory: Positive for shortness of breath. Negative for cough and wheezing.   Cardiovascular: Positive for chest pain. Negative for leg swelling.  Gastrointestinal: Negative for vomiting, abdominal pain and diarrhea.  Musculoskeletal: Negative for myalgias, back pain, neck pain and neck stiffness.  Skin: Negative for rash and wound.  Neurological: Negative for dizziness, syncope, weakness, light-headedness and numbness.  Psychiatric/Behavioral: Negative for confusion.  All other systems reviewed and are negative.     Allergies  Review of patient's allergies indicates no known allergies.  Home Medications   Prior to Admission medications   Medication Sig Start Date End Date Taking? Authorizing Provider  aspirin 81 MG tablet Take 81 mg by mouth daily.  Yes Historical Provider, MD  budesonide-formoterol (SYMBICORT) 160-4.5 MCG/ACT inhaler Inhale 2 puffs into the lungs 2 (two) times daily.   Yes Historical Provider, MD  cetirizine (ZYRTEC) 10 MG tablet Take 10 mg by mouth daily.   Yes Historical Provider, MD  citalopram (CELEXA) 40 MG tablet Take 40 mg by mouth daily.   Yes Historical Provider, MD  losartan-hydrochlorothiazide (HYZAAR) 100-25 MG per tablet Take 1 tablet by mouth daily.   Yes Historical Provider, MD  morphine (MS CONTIN) 30 MG 12 hr  tablet Take 30 mg by mouth 2 (two) times daily.   Yes Historical Provider, MD   Triage Vitals: BP 127/91 mmHg  Pulse 61  Temp(Src) 98.1 F (36.7 C) (Oral)  Resp 14  Ht '6\' 1"'$  (1.854 m)  Wt 214 lb (97.07 kg)  BMI 28.24 kg/m2  SpO2 99%   Physical Exam  Constitutional: He is oriented to person, place, and time. He appears well-developed and well-nourished. No distress.  HENT:  Head: Normocephalic and atraumatic.  Mouth/Throat: Oropharynx is clear and moist.  Eyes: EOM are normal. Pupils are equal, round, and reactive to light.  Neck: Normal range of motion. Neck supple.  Cardiovascular: Normal rate and regular rhythm.   Pulmonary/Chest: Effort normal and breath sounds normal. No respiratory distress. He has no wheezes. He has no rales. He exhibits no tenderness.  Abdominal: Soft. Bowel sounds are normal. He exhibits no distension and no mass. There is no tenderness. There is no rebound and no guarding.  Musculoskeletal: Normal range of motion. He exhibits no edema or tenderness.  No calf swelling or tenderness. Distal pulses intact  Neurological: He is alert and oriented to person, place, and time.  Skin: Skin is warm and dry. No rash noted. No erythema.  Psychiatric: He has a normal mood and affect. His behavior is normal.  Nursing note and vitals reviewed.   ED Course  Procedures (including critical care time)  DIAGNOSTIC STUDIES: Oxygen Saturation is 99% on RA, Normal by my interpretation.    COORDINATION OF CARE: 12:51 AM-Discussed treatment plan with pt at bedside and pt agreed to plan.     Labs Review Labs Reviewed  BASIC METABOLIC PANEL - Abnormal; Notable for the following:    Potassium 3.4 (*)    GFR calc non Af Amer 54 (*)    GFR calc Af Amer 63 (*)    All other components within normal limits  CBC  BRAIN NATRIURETIC PEPTIDE  I-STAT TROPOININ, ED    Imaging Review Dg Chest 2 View  05/25/2014   CLINICAL DATA:  Hypertension, recurring left-sided chest pain.  Abnormal EKG.  EXAM: CHEST  2 VIEW  COMPARISON:  01/05/2014 CT, 06/23/2013 radiograph  FINDINGS: Mild aortic tortuosity. Normal heart size. Mediastinal contours within normal range. Calcifications along the hemidiaphragms, in keeping with sequelae of prior asbestos exposure. Apical bulla. No confluent airspace opacity, pleural effusion, or pneumothorax. Mild multilevel degenerative change.  IMPRESSION: COPD and sequelae of prior asbestos exposure. No radiographic evidence of active cardiopulmonary disease.   Electronically Signed   By: Carlos Levering M.D.   On: 05/25/2014 23:52     EKG Interpretation None     ED ECG REPORT   Date: 05/26/2014  Rate: 62  Rhythm: normal sinus rhythm  QRS Axis: normal  Intervals: normal  ST/T Wave abnormalities: nonspecific T wave changes  Conduction Disutrbances:none  Narrative Interpretation:   Old EKG Reviewed: none available  I have personally reviewed the EKG tracing and agree with the computerized  printout as noted.  MDM   Final diagnoses:  Chest pain      I personally performed the services described in this documentation, which was scribed in my presence. The recorded information has been reviewed and is accurate.  Patient presents with left-sided chest pain starting this afternoon while doing yardwork. Currently resolved. Given 325 aspirin in the emergency department. Discuss with [facility] hospitalists which will admit  Lee Rice, MD 05/26/14 [phone]

## 2014-05-26 NOTE — Progress Notes (Signed)
Pt arrived to floor in NAD, no pain at this time. VSS. A/Ox4 up ad lib in room. Pt oriented to room and floor.

## 2014-05-27 ENCOUNTER — Other Ambulatory Visit: Payer: Self-pay | Admitting: Cardiology

## 2014-05-27 DIAGNOSIS — I10 Essential (primary) hypertension: Secondary | ICD-10-CM | POA: Diagnosis not present

## 2014-05-27 DIAGNOSIS — I309 Acute pericarditis, unspecified: Secondary | ICD-10-CM | POA: Diagnosis not present

## 2014-05-27 DIAGNOSIS — J439 Emphysema, unspecified: Secondary | ICD-10-CM | POA: Diagnosis present

## 2014-05-27 DIAGNOSIS — N289 Disorder of kidney and ureter, unspecified: Secondary | ICD-10-CM

## 2014-05-27 DIAGNOSIS — R0789 Other chest pain: Secondary | ICD-10-CM

## 2014-05-27 DIAGNOSIS — K219 Gastro-esophageal reflux disease without esophagitis: Secondary | ICD-10-CM | POA: Diagnosis not present

## 2014-05-27 DIAGNOSIS — R911 Solitary pulmonary nodule: Secondary | ICD-10-CM | POA: Diagnosis not present

## 2014-05-27 DIAGNOSIS — Z7982 Long term (current) use of aspirin: Secondary | ICD-10-CM | POA: Diagnosis not present

## 2014-05-27 DIAGNOSIS — E876 Hypokalemia: Secondary | ICD-10-CM | POA: Diagnosis not present

## 2014-05-27 DIAGNOSIS — F329 Major depressive disorder, single episode, unspecified: Secondary | ICD-10-CM | POA: Diagnosis not present

## 2014-05-27 MED ORDER — PANTOPRAZOLE SODIUM 40 MG PO TBEC
40.0000 mg | DELAYED_RELEASE_TABLET | Freq: Once | ORAL | Status: AC
  Administered 2014-05-27: 40 mg via ORAL

## 2014-05-27 MED ORDER — CITALOPRAM HYDROBROMIDE 40 MG PO TABS: 20.0000 mg | ORAL_TABLET | Freq: Every day | ORAL | Status: DC

## 2014-05-27 MED ORDER — IBUPROFEN 600 MG PO TABS: 600.0000 mg | ORAL_TABLET | Freq: Three times a day (TID) | ORAL | Status: DC

## 2014-05-27 MED ORDER — IBUPROFEN 800 MG PO TABS
800.0000 mg | ORAL_TABLET | Freq: Once | ORAL | Status: DC
  Filled 2014-05-27: qty 1

## 2014-05-27 MED ORDER — PANTOPRAZOLE SODIUM 40 MG PO TBEC: 40.0000 mg | DELAYED_RELEASE_TABLET | Freq: Every day | ORAL | Status: DC

## 2014-05-27 MED ORDER — COLCHICINE 0.6 MG PO TABS
0.6000 mg | ORAL_TABLET | Freq: Once | ORAL | Status: DC
Start: 2014-05-27 — End: 2014-05-27
  Filled 2014-05-27: qty 1

## 2014-05-27 NOTE — Progress Notes (Signed)
I cosign all documentation and medication administration by Tina Griffiths, student RN.

## 2014-05-27 NOTE — Progress Notes (Signed)
Subjective:  He denies chest pain this am  Objective:  Vital Signs in the last 24 hours: Temp:  [97 F (36.1 C)-98.3 F (36.8 C)] 97 F (36.1 C) (04/20 0505) Pulse Rate:  [58-63] 60 (04/20 0505) Resp:  [17-18] 17 (04/20 0505) BP: (111-151)/(67-88) 137/87 mmHg (04/20 0505) SpO2:  [97 %-100 %] 97 % (04/20 0505) Weight:  [208 lb 6.4 oz (94.53 kg)] 208 lb 6.4 oz (94.53 kg) (04/20 0505)  Intake/Output from previous day:  Intake/Output Summary (Last 24 hours) at 05/27/14 0823 Last data filed at 05/27/14 0509  Gross per 24 hour  Intake    600 ml  Output   1175 ml  Net   -575 ml    Physical Exam: General appearance: alert, cooperative and no distress Neck: no carotid bruit and no JVD Lungs: clear to auscultation bilaterally Heart: regular rate and rhythm and no rub heard Abdomen: soft, non-tender; bowel sounds normal; no masses,  no organomegaly Extremities: extremities normal, atraumatic, no cyanosis or edema   Rate: 60  Rhythm: normal sinus rhythm  Lab Results:  Recent Labs  05/25/14 2214  WBC 6.2  HGB 13.8  PLT 299    Recent Labs  05/25/14 2214  NA 139  K 3.4*  CL 107  CO2 23  GLUCOSE 87  BUN 13  CREATININE 1.31    Recent Labs  05/26/14 0627 05/26/14 0916  TROPONINI <0.03 <0.03   No results for input(s): INR in the last 72 hours.   Meds . aspirin EC  81 mg Oral Daily  . budesonide-formoterol  2 puff Inhalation BID  . citalopram  40 mg Oral Daily  . heparin  5,000 Units Subcutaneous 3 times per day  . losartan  100 mg Oral Daily   And  . hydrochlorothiazide  25 mg Oral Daily  . loratadine  10 mg Oral Daily  . morphine  30 mg Oral BID  . regadenoson  0.4 mg Intravenous Once     Imaging: Dg Chest 2 View  05/25/2014   CLINICAL DATA:  Hypertension, recurring left-sided chest pain. Abnormal EKG.  EXAM: CHEST  2 VIEW  COMPARISON:  01/05/2014 CT, 06/23/2013 radiograph  FINDINGS: Mild aortic tortuosity. Normal heart size. Mediastinal contours  within normal range. Calcifications along the hemidiaphragms, in keeping with sequelae of prior asbestos exposure. Apical bulla. No confluent airspace opacity, pleural effusion, or pneumothorax. Mild multilevel degenerative change.  IMPRESSION: COPD and sequelae of prior asbestos exposure. No radiographic evidence of active cardiopulmonary disease.   Electronically Signed   By: Carlos Levering M.D.   On: 05/25/2014 23:52   Ct Chest Wo Contrast  05/26/2014   CLINICAL DATA:  Left-sided chest pain.  Smoker.  EXAM: CT CHEST WITHOUT CONTRAST  TECHNIQUE: Multidetector CT imaging of the chest was performed following the standard protocol without IV contrast.  COMPARISON:  01/05/2014  FINDINGS: Dilatation of the ascending aorta up to 4.2 cm, tapering along the arch to a normal caliber. Scattered atherosclerotic disease of the aorta and branch vessels.  Normal heart size. Trace pericardial fluid. No pleural effusion. Bilateral calcified pleural plaques. No intrathoracic lymphadenopathy.  Upper abdominal images show an upper pole simple cyst within the right kidney.  Central airways are patent. No pneumothorax. Centrilobular emphysema. Biapical bullae. Bibasilar dependent atelectasis. A subpleural 4 mm ground-glass opacity within the lingula image 31 series 3.  Subpleural right lower lobe nodule measuring 7 mm, similar to prior (series 3, image 26).  No acute osseous finding.  IMPRESSION:  No acute intrathoracic process. Centrilobular emphysema and sequelae of prior asbestos exposure.  7 mm nodule within the right lower lobe is similar to prior. 4 mm lingular opacity, not appreciated on prior. Recommend 3- 6 month chest CT follow-up.   Electronically Signed   By: Carlos Levering M.D.   On: 05/26/2014 06:40   Mr Cardiac Stress Test  05/26/2014   CLINICAL DATA:  69 year old male with chest pain. Evaluate for ischemia vs pericarditis.  EXAM: CARDIAC MRI  TECHNIQUE: The patient was scanned on a 1.5 Tesla GE magnet. A  dedicated cardiac coil was used. Functional imaging was done using Fiesta sequences. 2,3, and 4 chamber views were done to assess for RWMA's. Modified Simpson's rule using a short axis stack was used to calculate an ejection fraction on a dedicated work Conservation officer, nature. The patient received 35 cc of Multihance. After 10 minutes inversion recovery sequences were used to assess for infiltration and scar tissue.  Stress test was performed using 0.4 mg of Regadenoson injection. The patient experienced mild shortness of breath consistent.  CONTRAST:  35 cc of Multihance  FINDINGS: 1. Normal left ventricular size, thickness and function (LVEF = 64%). There are no regional wall motion abnormalities.  There is no late gadolinium enhancement present in the myocardium of the left ventricle.  2. Normal right ventricular size, thickness and function (RVEF = 56%). No regional wall motion abnormalities.  3. Trivial mitral regurgitation. Mild tricuspid regurgitation.  4. Mildly dilated right atrium.  5. There is mild almost circumferential late gadolinium enhancement of the pericardium. No pericardial thickening.  6.  No resting or pharmacologically induced stress perfusion defect.  7. Normal size of the aortic root and ascending and descending thoracic aorta. Normal size of the pulmonary artery.  IMPRESSION: 1. Normal left ventricular size, thickness and function (LVEF = 64%) with no regional wall motion abnormalities.  There is no late gadolinium enhancement present in the myocardium of the left ventricle.  2. Normal right ventricular size, thickness and function (RVEF = 56%) with no regional wall motion abnormalities.  3. Trivial mitral regurgitation. Mild tricuspid regurgitation.  4. Mildly dilated right atrium.  5. Almost circumferential late gadolinium enhancement of the pericardium without thickening consistent with mild acute pericarditis.  6. No resting or pharmacologically induced stress perfusion defect  consistent with no prior myocardial infarction and no ischemia.  Lee Leblanc   Electronically Signed   By: Lee Leblanc   On: 05/26/2014 19:48      Assessment/Plan:  69 y.o. AA male with a history of HTN,  presented to the ED 05/26/14 with c/o sudden onset, intermittent, and ongoing left sided chest pain. Troponin negative, EKG without acute changes.  Principal Problem:   Chest pain Active Problems:   Acute pericarditis by MRI   Dyspnea   Pleural plaque without asbestosis   Renal insufficiency, mild (GFR 63)   Essential hypertension   Solitary pulmonary nodule   PLAN: Stress MRI negative for ischemia but remarkable for for acute mild pericarditis. Will give one dose of Ibuprofen and colchicine this am with a PPI and discuss further OP Rx with MD. He should probably have an OP echo in a few weeks.   Kerin Ransom PA-C Beeper [phone] 05/27/2014, 8:23 AM   Patient seen and examined. Agree with assessment and plan. Results of stress MRI noted. Feels better; no pleuritc pain. Today. No rub on exam. Agree with NSAID and colchicine with subsequent echo as outpatient.   Marcello Moores  A. Claiborne Billings, MD, [facility] 05/27/2014 10:19 AM

## 2014-05-27 NOTE — Discharge Summary (Signed)
Physician Discharge Summary  Lee Leblanc DXI:[id number] DOB: 14-Nov-1945 DOA: 05/26/2014  PCP: Kandice Hams, MD  Admit date: 05/26/2014 Discharge date: 05/27/2014  Time spent: 30 minutes  Recommendations for Outpatient Follow-up:  1. D/c home with pCP follow up in 1 week. Pt discharged on ibuprofen 600 mg tid for 7 days for possible acute pericarditis see on MRI. Please check renal fn in 1 week. Please follow CRP checked n 4/20, if elevated needs repeat level in 1 week and taper NSAIDs over 1 week. 2. Follow up with cardiology with plan on echo in 4-6 weeks 3. Patient also has a 4 mm lingular opacity on admission chest CT and recommend follow-up CT in 3-6 months.   Discharge Diagnoses:  Principal Problem:   Chest pain  Active Problems:   Acute pericarditis by MRI   Solitary pulmonary nodule   Pleural plaque without asbestosis   Renal insufficiency, mild (GFR 63)   Essential hypertension   COPD with emphysema  depression   Hypokalemia ( mild)    Discharge Condition: fair  Diet recommendation: regular  Filed Weights   05/25/14 2234 05/26/14 0317 05/27/14 0505  Weight: 97.07 kg (214 lb) 94.983 kg (209 lb 6.4 oz) 94.53 kg (208 lb 6.4 oz)    History of present illness:  Please refer to admission H&P for details, in brief, 69 year old male with history of hypertension, depression, COPD who presented with sudden onset left-sided chest pain which was sharp, intermittent lasting for a few minutes without any aggravating or relieving factors. Patient was seen by PCP on the day of admission was given a nitroglycerin tablet and sent to the ED. Initial evaluation in the ED showed negative troponin and normal EKG. Patient admitted for further workup.   Hospital Course:  Mild acute pericarditis. He was seen by cardiology who recommended symptoms were more consistent with acute pericarditis. A stress MRI was done which showed an EF of 64%, was negative for ischemia but showed  enhancement of the pericardium without thickening consistent with mild acute pericarditis. Patient does not have further chest pain symptoms. Serial troponins have been negative and stable on telemetry. Patient given a dose of ibuprofen and colchicine this morning. I will discharge him on a seven-day course of ibuprofen 600 mg 3 times a day. I have ordered for a CRP level this morning and should be followed up as outpatient. If elevated it should be repeated in one week . If CRP normal and no further symptoms NSAIDs can be discontinued after 1 week course. If CRP is elevated the NSAIDs can be tapered over 2 weeks duration. -Appreciate cardiology recommendations. Patient will need 2-D echo in 4-6 weeks as outpatient.  Depression Given his age will reduce his Celexa to 20 mg daily.  COPD Continue home inhaler  Essential hypertension Resume home medications  Lingular opacity on chest CT CT chest with contrast done on admission show centrilobular emphysema with sequel he of prior asbestos exposure. A 7 mm nodule within the right lower lobe appears unchanged from prior imaging. Also shows a 4 mm lingular opacity which was not seen on prior imaging and recommend 3-6 months chest CT follow-up.  Stable for discharge home with outpatient PCP follow.   Procedures:  MRI stress test  Consultations:  cardiology  Discharge Exam: Filed Vitals:   05/27/14 0505  BP: 137/87  Pulse: 60  Temp: 97 F (36.1 C)  Resp: 17    General: Elderly male in no acute distress HEENT: No pallor, moist oral mucosa  CVS: Normal S1 and S2, no murmurs rub or gallop, no precordial rub Chest: Clear to auscultation bilaterally, no added sounds GI: Soft, nondistended, nontender, bowel sounds present Musculoskeletal: Warm, no edema CNS: Alert and oriented  Discharge Instructions    Current Discharge Medication List    START taking these medications   Details  ibuprofen (ADVIL,MOTRIN) 600 MG tablet Take 1  tablet (600 mg total) by mouth every 8 (eight) hours. Qty: 21 tablet, Refills: 0           CONTINUE these medications which have CHANGED   Details  citalopram (CELEXA) 40 MG tablet Take 0.5 tablets (20 mg total) by mouth daily. Qty: 30 tablet, Refills: 0      CONTINUE these medications which have NOT CHANGED   Details  aspirin 81 MG tablet Take 81 mg by mouth daily.    budesonide-formoterol (SYMBICORT) 160-4.5 MCG/ACT inhaler Inhale 2 puffs into the lungs 2 (two) times daily.    cetirizine (ZYRTEC) 10 MG tablet Take 10 mg by mouth daily.    losartan-hydrochlorothiazide (HYZAAR) 100-25 MG per tablet Take 1 tablet by mouth daily.    morphine (MS CONTIN) 30 MG 12 hr tablet Take 30 mg by mouth 2 (two) times daily.       No Known Allergies Follow-up Information    Follow up with POLITE,RONALD D, MD. Schedule an appointment as soon as possible for a visit in 1 week.   Specialty:  Internal Medicine   Contact information:   [address] [place] [address] [phone]       Follow up with Shelva Majestic A, MD. Call in 4 weeks.   Specialty:  Cardiology   Contact information:   [address] [place] [address] [phone]        The results of significant diagnostics from this hospitalization (including imaging, microbiology, ancillary and laboratory) are listed below for reference.    Significant Diagnostic Studies: Dg Chest 2 View  05/25/2014   CLINICAL DATA:  Hypertension, recurring left-sided chest pain. Abnormal EKG.  EXAM: CHEST  2 VIEW  COMPARISON:  01/05/2014 CT, 06/23/2013 radiograph  FINDINGS: Mild aortic tortuosity. Normal heart size. Mediastinal contours within normal range. Calcifications along the hemidiaphragms, in keeping with sequelae of prior asbestos exposure. Apical bulla. No confluent airspace opacity, pleural effusion, or pneumothorax. Mild multilevel degenerative change.  IMPRESSION: COPD and sequelae of prior asbestos  exposure. No radiographic evidence of active cardiopulmonary disease.   Electronically Signed   By: Carlos Levering M.D.   On: 05/25/2014 23:52   Ct Chest Wo Contrast  05/26/2014   CLINICAL DATA:  Left-sided chest pain.  Smoker.  EXAM: CT CHEST WITHOUT CONTRAST  TECHNIQUE: Multidetector CT imaging of the chest was performed following the standard protocol without IV contrast.  COMPARISON:  01/05/2014  FINDINGS: Dilatation of the ascending aorta up to 4.2 cm, tapering along the arch to a normal caliber. Scattered atherosclerotic disease of the aorta and branch vessels.  Normal heart size. Trace pericardial fluid. No pleural effusion. Bilateral calcified pleural plaques. No intrathoracic lymphadenopathy.  Upper abdominal images show an upper pole simple cyst within the right kidney.  Central airways are patent. No pneumothorax. Centrilobular emphysema. Biapical bullae. Bibasilar dependent atelectasis. A subpleural 4 mm ground-glass opacity within the lingula image 31 series 3.  Subpleural right lower lobe nodule measuring 7 mm, similar to prior (series 3, image 26).  No acute osseous finding.  IMPRESSION: No acute intrathoracic process. Centrilobular emphysema and sequelae  of prior asbestos exposure.  7 mm nodule within the right lower lobe is similar to prior. 4 mm lingular opacity, not appreciated on prior. Recommend 3- 6 month chest CT follow-up.   Electronically Signed   By: Carlos Levering M.D.   On: 05/26/2014 06:40   Mr Cardiac Stress Test  05/26/2014   CLINICAL DATA:  69 year old male with chest pain. Evaluate for ischemia vs pericarditis.  EXAM: CARDIAC MRI  TECHNIQUE: The patient was scanned on a 1.5 Tesla GE magnet. A dedicated cardiac coil was used. Functional imaging was done using Fiesta sequences. 2,3, and 4 chamber views were done to assess for RWMA's. Modified Simpson's rule using a short axis stack was used to calculate an ejection fraction on a dedicated work Conservation officer, nature.  The patient received 35 cc of Multihance. After 10 minutes inversion recovery sequences were used to assess for infiltration and scar tissue.  Stress test was performed using 0.4 mg of Regadenoson injection. The patient experienced mild shortness of breath consistent.  CONTRAST:  35 cc of Multihance  FINDINGS: 1. Normal left ventricular size, thickness and function (LVEF = 64%). There are no regional wall motion abnormalities.  There is no late gadolinium enhancement present in the myocardium of the left ventricle.  2. Normal right ventricular size, thickness and function (RVEF = 56%). No regional wall motion abnormalities.  3. Trivial mitral regurgitation. Mild tricuspid regurgitation.  4. Mildly dilated right atrium.  5. There is mild almost circumferential late gadolinium enhancement of the pericardium. No pericardial thickening.  6.  No resting or pharmacologically induced stress perfusion defect.  7. Normal size of the aortic root and ascending and descending thoracic aorta. Normal size of the pulmonary artery.  IMPRESSION: 1. Normal left ventricular size, thickness and function (LVEF = 64%) with no regional wall motion abnormalities.  There is no late gadolinium enhancement present in the myocardium of the left ventricle.  2. Normal right ventricular size, thickness and function (RVEF = 56%) with no regional wall motion abnormalities.  3. Trivial mitral regurgitation. Mild tricuspid regurgitation.  4. Mildly dilated right atrium.  5. Almost circumferential late gadolinium enhancement of the pericardium without thickening consistent with mild acute pericarditis.  6. No resting or pharmacologically induced stress perfusion defect consistent with no prior myocardial infarction and no ischemia.  Ena Dawley   Electronically Signed   By: Ena Dawley   On: 05/26/2014 19:48    Microbiology: No results found for this or any previous visit (from the past 240 hour(s)).   Labs: Basic Metabolic  Panel:  Recent Labs Lab 05/25/14 2214  NA 139  K 3.4*  CL 107  CO2 23  GLUCOSE 87  BUN 13  CREATININE 1.31  CALCIUM 9.1   Liver Function Tests: No results for input(s): AST, ALT, ALKPHOS, BILITOT, PROT, ALBUMIN in the last 168 hours. No results for input(s): LIPASE, AMYLASE in the last 168 hours. No results for input(s): AMMONIA in the last 168 hours. CBC:  Recent Labs Lab 05/25/14 2214  WBC 6.2  HGB 13.8  HCT 39.6  MCV 88.0  PLT 299   Cardiac Enzymes:  Recent Labs Lab 05/26/14 0422 05/26/14 0627 05/26/14 0916  TROPONINI <0.03 <0.03 <0.03   BNP: BNP (last 3 results)  Recent Labs  05/25/14 2214  BNP 27.1    ProBNP (last 3 results)  Recent Labs  01/13/14 1226  PROBNP 53.0    CBG: No results for input(s): GLUCAP in the last 168 hours.  SignedLouellen Molder  [facility] 05/27/2014, 10:27 AM

## 2014-05-27 NOTE — Discharge Instructions (Signed)
Pericarditis Pericarditis is swelling (inflammation) of the pericardium. The pericardium is a thin, double-layered, fluid-filled tissue sac that surrounds the heart. The purpose of the pericardium is to contain the heart in the chest cavity and keep the heart from overexpanding. Different types of pericarditis can occur, such as:  Acute pericarditis. Inflammation can develop suddenly in acute pericarditis.  Chronic pericarditis. Inflammation develops gradually and is long-lasting in chronic pericarditis.  Constrictive pericarditis. In this type of pericarditis, the layers of the pericardium stiffen and develop scar tissue. The scar tissue thickens and sticks together. This makes it difficult for the heart to pump and work as it normally does. CAUSES  Pericarditis can be caused from different conditions, such as:  A bacterial, fungal or viral infection.  After a heart attack (myocardial infarction).  After open-heart surgery (coronary bypass graft surgery).  Auto-immune conditions such as lupus, rheumatoid arthritis or scleroderma.  Kidney failure.  Low thyroid condition (hypothyroidism).  Cancer from another part of the body that has spread (metastasized) to the pericardium.  Chest injury or trauma.  After radiation treatment.  Certain medicines. SYMPTOMS  Symptoms of pericarditis can include:  Chest pain. Chest pain symptoms may increase when laying down and may be relieved when sitting up and leaning forward.  A chronic, dry cough.  Heart palpitations. These may feel like rapid, fluttering or pounding heart beats.  Chest pain may be worse when swallowing.  Dizziness or fainting.  Tiredness, fatigue or lethargy.  Fever. DIAGNOSIS  Pericarditis is diagnosed by the following:  A physical exam. A heart sound called a pericardial friction rub may be heard when your caregiver listens to your heart.  Blood work. Blood may be drawn to check for an infection and to look at  your blood chemistry.  Electrocardiography. During electrocardiography your heart's electrical activity is monitored and recorded with a tracing on paper (electrocardiogram [ECG]).  Echocardiography.  Computed tomography (CT).  Magnetic resonance image (MRI). TREATMENT  To treat pericarditis, it is important to know the cause of it. The cause of pericarditis determines the treatment.   If the cause of pericarditis is due to an infection, treatment is based on the type of infection. If an infection is suspected in the pericardial fluid, a procedure called a pericardial fluid culture and biopsy may be done. This takes a sample of the pericardial fluid. The sample is sent to a lab which runs tests on the pericardial fluid to check for an infection.  If the autoimmune disease is the cause, treatment of the autoimmune condition will help improve the pericarditis.  If the cause of pericarditis is not known, anti-inflammatory medicines may be used to help decrease the inflammation.  Surgery may be needed. The following are types of surgeries or procedures that may be done to treat pericarditis:  Pericardial window. A pericardial window makes a cut (incision) into the pericardial sac. This allows excess fluid in the pericardium to drain.  Pericardiocentesis. A pericardiocentesis is also known as a pericardial tap. This procedure uses a needle that is guided by X-ray to drain (aspirate) excess fluid from the pericardium.  Pericardiectomy. A pericardiectomy removes part or all of the pericardium. HOME CARE INSTRUCTIONS   Do not smoke. If you smoke, quit. Your caregiver can help you quit smoking.  Maintain a healthy weight.  Follow an exercise program as told by your caregiver.  If you drink alcohol, do so in moderation.  Eat a heart healthy diet. A registered dietician can help you learn about  healthy food choices.  Keep a list of all your medicines with you at all times. Include the name,  dose, how often it is taken and how it is taken. SEEK IMMEDIATE MEDICAL CARE IF:   You have chest pain or feelings of chest pressure.  You have sweating (diaphoresis) when at rest.  You have irregular heartbeats (palpitations).  You have rapid, racing heart beats.  You have unexplained fainting episodes.  You feel sick to your stomach (nausea) or vomiting without cause.  You have unexplained weakness. If you develop any of the symptoms which originally made you seek care, call for local emergency medical help. Do not drive yourself to the hospital. Document Released: 07/19/2000 Document Revised: 04/17/2011 Document Reviewed: 01/25/2011 [facility] Patient Information 2015 [place], [place]. This information is not intended to replace advice given to you by your health care provider. Make sure you discuss any questions you have with your health care provider.

## 2014-05-28 LAB — C-REACTIVE PROTEIN: CRP: <0.5 mg/dL — ABNORMAL LOW (ref <0.60)

## 2014-06-04 DIAGNOSIS — I319 Disease of pericardium, unspecified: Secondary | ICD-10-CM | POA: Diagnosis not present

## 2014-06-04 DIAGNOSIS — R079 Chest pain, unspecified: Secondary | ICD-10-CM | POA: Diagnosis not present

## 2014-06-24 ENCOUNTER — Ambulatory Visit (HOSPITAL_COMMUNITY): Payer: Commercial Managed Care - HMO | Attending: Cardiology

## 2014-06-26 DIAGNOSIS — I10 Essential (primary) hypertension: Secondary | ICD-10-CM | POA: Diagnosis not present

## 2014-06-26 DIAGNOSIS — F329 Major depressive disorder, single episode, unspecified: Secondary | ICD-10-CM | POA: Diagnosis not present

## 2014-06-26 DIAGNOSIS — I319 Disease of pericardium, unspecified: Secondary | ICD-10-CM | POA: Diagnosis not present

## 2014-06-26 DIAGNOSIS — Z1389 Encounter for screening for other disorder: Secondary | ICD-10-CM | POA: Diagnosis not present

## 2014-06-26 DIAGNOSIS — M545 Low back pain: Secondary | ICD-10-CM | POA: Diagnosis not present

## 2014-06-26 DIAGNOSIS — Z77090 Contact with and (suspected) exposure to asbestos: Secondary | ICD-10-CM | POA: Diagnosis not present

## 2014-06-26 DIAGNOSIS — Z0000 Encounter for general adult medical examination without abnormal findings: Secondary | ICD-10-CM | POA: Diagnosis not present

## 2014-06-26 DIAGNOSIS — M6530 Trigger finger, unspecified finger: Secondary | ICD-10-CM | POA: Diagnosis not present

## 2014-07-10 ENCOUNTER — Ambulatory Visit: Payer: Commercial Managed Care - HMO | Admitting: Cardiology

## 2014-07-27 ENCOUNTER — Encounter: Payer: Self-pay | Admitting: Cardiology

## 2014-09-07 DIAGNOSIS — G8929 Other chronic pain: Secondary | ICD-10-CM | POA: Diagnosis not present

## 2014-09-07 DIAGNOSIS — K921 Melena: Secondary | ICD-10-CM | POA: Diagnosis not present

## 2014-09-07 DIAGNOSIS — K5900 Constipation, unspecified: Secondary | ICD-10-CM | POA: Diagnosis not present

## 2014-11-13 ENCOUNTER — Other Ambulatory Visit: Payer: Self-pay | Admitting: Internal Medicine

## 2014-11-13 DIAGNOSIS — R911 Solitary pulmonary nodule: Secondary | ICD-10-CM

## 2014-11-27 DIAGNOSIS — R109 Unspecified abdominal pain: Secondary | ICD-10-CM | POA: Diagnosis not present

## 2014-11-27 DIAGNOSIS — Z23 Encounter for immunization: Secondary | ICD-10-CM | POA: Diagnosis not present

## 2014-12-29 DIAGNOSIS — R1084 Generalized abdominal pain: Secondary | ICD-10-CM | POA: Diagnosis not present

## 2014-12-29 DIAGNOSIS — I10 Essential (primary) hypertension: Secondary | ICD-10-CM | POA: Diagnosis not present

## 2014-12-29 DIAGNOSIS — G8929 Other chronic pain: Secondary | ICD-10-CM | POA: Diagnosis not present

## 2014-12-29 DIAGNOSIS — F329 Major depressive disorder, single episode, unspecified: Secondary | ICD-10-CM | POA: Diagnosis not present

## 2015-01-06 ENCOUNTER — Ambulatory Visit (INDEPENDENT_AMBULATORY_CARE_PROVIDER_SITE_OTHER)
Admission: RE | Admit: 2015-01-06 | Discharge: 2015-01-06 | Disposition: A | Discharge status: Home / Self Care | Payer: Commercial Managed Care - HMO | Source: Ambulatory Visit | Attending: Internal Medicine | Admitting: Internal Medicine

## 2015-01-06 DIAGNOSIS — R911 Solitary pulmonary nodule: Secondary | ICD-10-CM

## 2015-01-07 NOTE — Progress Notes (Signed)
Quick Note:  Called and spoke with patient. Informed him of results and recs. Patient voiced understanding and had no further questions. ______

## 2015-04-30 DIAGNOSIS — R319 Hematuria, unspecified: Secondary | ICD-10-CM | POA: Diagnosis not present

## 2015-04-30 DIAGNOSIS — M545 Low back pain: Secondary | ICD-10-CM | POA: Diagnosis not present

## 2015-04-30 DIAGNOSIS — R350 Frequency of micturition: Secondary | ICD-10-CM | POA: Diagnosis not present

## 2015-05-07 DIAGNOSIS — M545 Low back pain: Secondary | ICD-10-CM | POA: Diagnosis not present

## 2015-05-10 DIAGNOSIS — R319 Hematuria, unspecified: Secondary | ICD-10-CM | POA: Diagnosis not present

## 2015-05-11 DIAGNOSIS — M545 Low back pain: Secondary | ICD-10-CM | POA: Diagnosis not present

## 2015-05-17 DIAGNOSIS — M545 Low back pain: Secondary | ICD-10-CM | POA: Diagnosis not present

## 2015-05-19 DIAGNOSIS — M545 Low back pain: Secondary | ICD-10-CM | POA: Diagnosis not present

## 2015-05-24 DIAGNOSIS — M5116 Intervertebral disc disorders with radiculopathy, lumbar region: Secondary | ICD-10-CM | POA: Diagnosis not present

## 2015-05-26 DIAGNOSIS — M5136 Other intervertebral disc degeneration, lumbar region: Secondary | ICD-10-CM | POA: Diagnosis not present

## 2015-05-26 DIAGNOSIS — M4726 Other spondylosis with radiculopathy, lumbar region: Secondary | ICD-10-CM | POA: Diagnosis not present

## 2015-05-26 DIAGNOSIS — M549 Dorsalgia, unspecified: Secondary | ICD-10-CM | POA: Diagnosis not present

## 2015-05-26 DIAGNOSIS — M5126 Other intervertebral disc displacement, lumbar region: Secondary | ICD-10-CM | POA: Diagnosis not present

## 2015-05-26 DIAGNOSIS — M546 Pain in thoracic spine: Secondary | ICD-10-CM | POA: Diagnosis not present

## 2015-05-26 DIAGNOSIS — M4806 Spinal stenosis, lumbar region: Secondary | ICD-10-CM | POA: Diagnosis not present

## 2015-06-03 DIAGNOSIS — M5126 Other intervertebral disc displacement, lumbar region: Secondary | ICD-10-CM | POA: Diagnosis not present

## 2015-06-03 DIAGNOSIS — M5136 Other intervertebral disc degeneration, lumbar region: Secondary | ICD-10-CM | POA: Diagnosis not present

## 2015-06-03 DIAGNOSIS — M5116 Intervertebral disc disorders with radiculopathy, lumbar region: Secondary | ICD-10-CM | POA: Diagnosis not present

## 2015-06-03 DIAGNOSIS — M4726 Other spondylosis with radiculopathy, lumbar region: Secondary | ICD-10-CM | POA: Diagnosis not present

## 2015-06-16 ENCOUNTER — Telehealth: Payer: Self-pay | Admitting: Internal Medicine

## 2015-06-16 ENCOUNTER — Other Ambulatory Visit: Payer: Self-pay | Admitting: Internal Medicine

## 2015-06-16 DIAGNOSIS — R911 Solitary pulmonary nodule: Secondary | ICD-10-CM

## 2015-06-16 NOTE — Telephone Encounter (Signed)
OK, will forward to MW to make him aware

## 2015-06-16 NOTE — Telephone Encounter (Signed)
Ok placed in tickle file for recall by end of year

## 2015-06-22 ENCOUNTER — Other Ambulatory Visit: Payer: Commercial Managed Care - HMO

## 2015-06-28 DIAGNOSIS — Z1389 Encounter for screening for other disorder: Secondary | ICD-10-CM | POA: Diagnosis not present

## 2015-06-28 DIAGNOSIS — I10 Essential (primary) hypertension: Secondary | ICD-10-CM | POA: Diagnosis not present

## 2015-06-28 DIAGNOSIS — G8929 Other chronic pain: Secondary | ICD-10-CM | POA: Diagnosis not present

## 2015-06-28 DIAGNOSIS — F339 Major depressive disorder, recurrent, unspecified: Secondary | ICD-10-CM | POA: Diagnosis not present

## 2015-06-28 DIAGNOSIS — Z77090 Contact with and (suspected) exposure to asbestos: Secondary | ICD-10-CM | POA: Diagnosis not present

## 2015-06-28 DIAGNOSIS — Z0000 Encounter for general adult medical examination without abnormal findings: Secondary | ICD-10-CM | POA: Diagnosis not present

## 2015-06-28 DIAGNOSIS — Z125 Encounter for screening for malignant neoplasm of prostate: Secondary | ICD-10-CM | POA: Diagnosis not present

## 2015-08-27 DIAGNOSIS — R42 Dizziness and giddiness: Secondary | ICD-10-CM | POA: Diagnosis not present

## 2015-09-16 DIAGNOSIS — N401 Enlarged prostate with lower urinary tract symptoms: Secondary | ICD-10-CM | POA: Diagnosis not present

## 2015-09-16 DIAGNOSIS — K5900 Constipation, unspecified: Secondary | ICD-10-CM | POA: Diagnosis not present

## 2015-09-16 DIAGNOSIS — R42 Dizziness and giddiness: Secondary | ICD-10-CM | POA: Diagnosis not present

## 2015-10-07 DIAGNOSIS — H5210 Myopia, unspecified eye: Secondary | ICD-10-CM | POA: Diagnosis not present

## 2015-11-12 ENCOUNTER — Other Ambulatory Visit: Payer: Self-pay | Admitting: Internal Medicine

## 2015-11-12 DIAGNOSIS — R911 Solitary pulmonary nodule: Secondary | ICD-10-CM

## 2015-12-09 ENCOUNTER — Other Ambulatory Visit: Payer: Self-pay | Admitting: Internal Medicine

## 2015-12-09 ENCOUNTER — Ambulatory Visit
Admission: RE | Admit: 2015-12-09 | Discharge: 2015-12-09 | Disposition: A | Discharge status: Home / Self Care | Payer: Commercial Managed Care - HMO | Source: Ambulatory Visit | Attending: Internal Medicine | Admitting: Internal Medicine

## 2015-12-09 DIAGNOSIS — R109 Unspecified abdominal pain: Secondary | ICD-10-CM

## 2015-12-09 DIAGNOSIS — Z23 Encounter for immunization: Secondary | ICD-10-CM | POA: Diagnosis not present

## 2015-12-17 DIAGNOSIS — R109 Unspecified abdominal pain: Secondary | ICD-10-CM | POA: Diagnosis not present

## 2015-12-23 ENCOUNTER — Ambulatory Visit (INDEPENDENT_AMBULATORY_CARE_PROVIDER_SITE_OTHER)
Admission: RE | Admit: 2015-12-23 | Discharge: 2015-12-23 | Disposition: A | Discharge status: Home / Self Care | Payer: Commercial Managed Care - HMO | Source: Ambulatory Visit | Attending: Internal Medicine | Admitting: Internal Medicine

## 2015-12-23 DIAGNOSIS — R911 Solitary pulmonary nodule: Secondary | ICD-10-CM

## 2015-12-24 NOTE — Progress Notes (Signed)
LMTCB

## 2015-12-29 ENCOUNTER — Encounter: Payer: Self-pay | Admitting: *Deleted

## 2015-12-29 NOTE — Progress Notes (Signed)
LMTCB

## 2015-12-29 NOTE — Progress Notes (Signed)
Letter mailed

## 2015-12-31 ENCOUNTER — Telehealth: Payer: Self-pay | Admitting: Internal Medicine

## 2015-12-31 NOTE — Telephone Encounter (Signed)
Spoke with pt. And gave him his CT results per MW. He requested that this be faxed to Dr. Delfina Redwood. Fax was sent. He will follow up with Dr. Delfina Redwood about the referral. Nothing further needed at this time.

## 2016-01-07 DIAGNOSIS — I7789 Other specified disorders of arteries and arterioles: Secondary | ICD-10-CM | POA: Diagnosis not present

## 2016-01-26 ENCOUNTER — Encounter: Payer: Commercial Managed Care - HMO | Admitting: Cardiothoracic Surgery

## 2016-01-26 NOTE — Progress Notes (Signed)
erro  neous encounter

## 2016-02-11 DIAGNOSIS — M25511 Pain in right shoulder: Secondary | ICD-10-CM | POA: Diagnosis not present

## 2016-02-11 DIAGNOSIS — R42 Dizziness and giddiness: Secondary | ICD-10-CM | POA: Diagnosis not present

## 2016-02-14 DIAGNOSIS — M7581 Other shoulder lesions, right shoulder: Secondary | ICD-10-CM | POA: Diagnosis not present

## 2016-02-16 ENCOUNTER — Encounter: Payer: Self-pay | Admitting: Cardiothoracic Surgery

## 2016-02-16 ENCOUNTER — Institutional Professional Consult (permissible substitution) (INDEPENDENT_AMBULATORY_CARE_PROVIDER_SITE_OTHER): Payer: Commercial Managed Care - HMO | Admitting: Cardiothoracic Surgery

## 2016-02-16 VITALS — BP 141/85 | HR 75 | Resp 16 | Ht 73.5 in | Wt 215.0 lb

## 2016-02-16 DIAGNOSIS — I712 Thoracic aortic aneurysm, without rupture: Secondary | ICD-10-CM | POA: Diagnosis not present

## 2016-02-16 DIAGNOSIS — I7121 Aneurysm of the ascending aorta, without rupture: Secondary | ICD-10-CM

## 2016-02-16 DIAGNOSIS — D381 Neoplasm of uncertain behavior of trachea, bronchus and lung: Secondary | ICD-10-CM | POA: Diagnosis not present

## 2016-02-16 NOTE — Progress Notes (Signed)
PCP is Kandice Hams, MD Referring Provider is Seward Carol, MD  Chief Complaint  Patient presents with  . Thoracic Aortic Aneurysm    Surgical eval, Chest CT 12/23/15   Patient examined, CT angiograms of the chest from 2016 in 2017 personally reviewed and counseled with patient  HPI: 71 year old AA hypertensive male reformed smoker presents with recent diagnosis of 4.4 cm fusiform ascending thoracic aortic aneurysm. This is asymptomatic. The patient has been followed with CT scans of the chest for small right pulmonary nodules and asbestos  diaphragm plaque. On CT scans from 2016 and November 2017 his thoracic aorta was noted being 4.4 cm. There is no intramural hematoma or ulceration. The arch and descending thoracic aorta appear normal. The patient is asymptomatic of chest pain. There is no family history of aortic dissection. An uncle of the patient's had a AAA repaired. The patient has been taking antihypertensive medication-losartan and Norvasc, 4 years. The patient stopped smoking 6 months ago. He is retired Administrator.    Past Medical History:  Diagnosis Date  . COPD (chronic obstructive pulmonary disease) (HCC)    has not used albuterol inhaler for last 2 months  . Depression   . GERD (gastroesophageal reflux disease)   . Hypertension     Past Surgical History:  Procedure Laterality Date  . BACK SURGERY    lasdt 1990's   lower x 7  . COLONOSCOPY WITH PROPOFOL N/A 06/25/2012   Procedure: COLONOSCOPY WITH PROPOFOL;  Surgeon: Garlan Fair, MD;  Location: [facility];  Service: Endoscopy;  Laterality: N/A;  . ESOPHAGOGASTRODUODENOSCOPY (EGD) WITH PROPOFOL N/A 06/25/2012   Procedure: ESOPHAGOGASTRODUODENOSCOPY (EGD) WITH PROPOFOL;  Surgeon: Garlan Fair, MD;  Location: [facility];  Service: Endoscopy;  Laterality: N/A;    Family History  Problem Relation Age of Onset  . Allergies Daughter   . Allergies Son   . Leukemia Sister   . Prostate cancer Father      Social History Social History  Substance Use Topics  . Smoking status: Former Smoker    Packs/day: 0.50    Years: 50.00    Types: Cigarettes    Quit date: 02/07/2011  . Smokeless tobacco: Never Used  . Alcohol use 0.0 oz/week     Comment: occasional    Current Outpatient Prescriptions  Medication Sig Dispense Refill  . amLODipine (NORVASC) 5 MG tablet Take 5 mg by mouth daily.    Marland Kitchen aspirin 81 MG tablet Take 81 mg by mouth daily.    . budesonide-formoterol (SYMBICORT) 160-4.5 MCG/ACT inhaler Inhale 2 puffs into the lungs 2 (two) times daily.    . cetirizine (ZYRTEC) 10 MG tablet Take 10 mg by mouth daily.    . citalopram (CELEXA) 40 MG tablet Take 0.5 tablets (20 mg total) by mouth daily. 30 tablet 0  . losartan-hydrochlorothiazide (HYZAAR) 100-25 MG per tablet Take 1 tablet by mouth daily.    Marland Kitchen morphine (MS CONTIN) 30 MG 12 hr tablet Take 30 mg by mouth 2 (two) times daily.    . polyethylene glycol (MIRALAX / GLYCOLAX) packet Take 17 g by mouth daily as needed.     No current facility-administered medications for this visit.     No Known Allergies  Review of Systems         Review of Systems :  [ y ] = yes, [  ] = no        General :  Weight gain [   ]    Weight  loss  [   ]  Fatigue [  ]  Fever [  ]  Chills  [  ]                                Weakness  [  ]           HEENT    Headache [  ]  Dizziness [  ]  Blurred vision [  ] Glaucoma  [  ]                          Nosebleeds [  ] Painful or loose teeth [  ]        Cardiac :  Chest pain/ pressure [  ]  Resting SOB [  ] exertional SOB [  ]                        Orthopnea [  ]  Pedal edema  [  ]  Palpitations [  ] Syncope/presyncope '[ ]'$                         Paroxysmal nocturnal dyspnea [  ]         Pulmonary : cough [  ]  wheezing [  ]  Hemoptysis [  ] Sputum [  ] Snoring [  ] mild COPD on PFTs 4-5 millimeter stable nodules in the right lower lobe and right upper lobe                              Pneumothorax [  ]   Sleep apnea [  ]        GI : Vomiting [  ]  Dysphagia [  ]  Melena  [  ]  Abdominal pain [  ] BRBPR [  ]              Heart burn [  ]  Constipation [  ] Diarrhea  [  ] Colonoscopy [   ]        GU : Hematuria [  ]  Dysuria [  ]  Nocturia [  ] UTI's [  ]        Vascular : Claudication [  ]  Rest pain [  ]  DVT [  ] Vein stripping [  ] leg ulcers [  ]                          TIA [  ] Stroke [  ]  Varicose veins [  ]        NEURO :  Headaches  [  ] Seizures [  ] Vision changes [  ] Paresthesias [  ]                                       Seizures [  ]        Musculoskeletal :  Arthritis [  ] Gout  [  ]  Back pain [yes several low back operations  ]  Joint pain [  ]patient in chronic pain        Skin :  Rash [  ]  Melanoma [  ] Sores [  ]        Heme : Bleeding problems [  ]Clotting Disorders [  ] Anemia [  ]Blood Transfusion '[ ]'$         Endocrine : Diabetes [  ] Heat or Cold intolerance [  ] Polyuria [  ]excessive thirst '[ ]'$         Psych : Depression [  ]  Anxiety [  ]  Psych hospitalizations [  ] Memory change [  ]                                               BP (!) 141/85 (BP Location: Left Arm, Patient Position: Sitting, Cuff Size: Large)   Pulse 75   Resp 16   Ht 6' 1.5" (1.867 m)   Wt 215 lb (97.5 kg)   SpO2 95% Comment: ON RA  BMI 27.98 kg/m  Physical Exam     Physical Exam  General: Well-developed tall male no acute distress HEENT: Normocephalic pupils equal , dentition adequate Neck: Supple without JVD, adenopathy, or bruit Chest: Clear to auscultation, symmetrical breath sounds, no rhonchi, no tenderness             or deformity Cardiovascular: Regular rate and rhythm, no murmur, no gallop, peripheral pulses             palpable in all extremities Abdomen:  Soft, nontender, no palpable mass or organomegaly Extremities: Warm, well-perfused, no clubbing cyanosis edema or tenderness,              no venous stasis changes of the legs Rectal/GU: Deferred Neuro:  Grossly non--focal and symmetrical throughout Skin: Clean and dry without rash or ulceration   Diagnostic Tests: CT scan shows a smooth fusiform 4.4 cm ascending aneurysm. There is been no significant change since the scan in 2016.   Impression: 4.4 cm fusiform aneurysm is at low risk for dissection, less than 2% per year. Best therapy is blood pressure control and complete smoking cessation. This was discussed in detail with the patient. Surgical repair is not indicated until diameter is 5.5 cm at which point the risk for aortic dissection increases to 6-7 percent.  Plan:Serial CT scans of the chest will be planned. The patient return in one year with a CTA of the thoracic aorta. He understands the importance of smoking cessation and compliance with his antihypertensive medication.   Len Childs, MD [facility] [phone]

## 2016-05-18 DIAGNOSIS — M5441 Lumbago with sciatica, right side: Secondary | ICD-10-CM | POA: Diagnosis not present

## 2016-05-18 DIAGNOSIS — G8929 Other chronic pain: Secondary | ICD-10-CM | POA: Diagnosis not present

## 2016-05-18 DIAGNOSIS — R8290 Unspecified abnormal findings in urine: Secondary | ICD-10-CM | POA: Diagnosis not present

## 2016-07-26 DIAGNOSIS — I10 Essential (primary) hypertension: Secondary | ICD-10-CM | POA: Diagnosis not present

## 2016-07-26 DIAGNOSIS — G8929 Other chronic pain: Secondary | ICD-10-CM | POA: Diagnosis not present

## 2016-07-26 DIAGNOSIS — K5900 Constipation, unspecified: Secondary | ICD-10-CM | POA: Diagnosis not present

## 2016-07-26 DIAGNOSIS — Z125 Encounter for screening for malignant neoplasm of prostate: Secondary | ICD-10-CM | POA: Diagnosis not present

## 2016-07-26 DIAGNOSIS — I712 Thoracic aortic aneurysm, without rupture: Secondary | ICD-10-CM | POA: Diagnosis not present

## 2016-07-26 DIAGNOSIS — F3341 Major depressive disorder, recurrent, in partial remission: Secondary | ICD-10-CM | POA: Diagnosis not present

## 2016-07-26 DIAGNOSIS — M25511 Pain in right shoulder: Secondary | ICD-10-CM | POA: Diagnosis not present

## 2016-07-26 DIAGNOSIS — Z0000 Encounter for general adult medical examination without abnormal findings: Secondary | ICD-10-CM | POA: Diagnosis not present

## 2016-07-26 DIAGNOSIS — Z1389 Encounter for screening for other disorder: Secondary | ICD-10-CM | POA: Diagnosis not present

## 2016-07-26 DIAGNOSIS — M5441 Lumbago with sciatica, right side: Secondary | ICD-10-CM | POA: Diagnosis not present

## 2016-07-26 DIAGNOSIS — N401 Enlarged prostate with lower urinary tract symptoms: Secondary | ICD-10-CM | POA: Diagnosis not present

## 2016-11-02 DIAGNOSIS — G894 Chronic pain syndrome: Secondary | ICD-10-CM | POA: Diagnosis not present

## 2016-11-02 DIAGNOSIS — M545 Low back pain: Secondary | ICD-10-CM | POA: Diagnosis not present

## 2016-11-27 DIAGNOSIS — Z5181 Encounter for therapeutic drug level monitoring: Secondary | ICD-10-CM | POA: Diagnosis not present

## 2016-11-27 DIAGNOSIS — M542 Cervicalgia: Secondary | ICD-10-CM | POA: Diagnosis not present

## 2016-11-27 DIAGNOSIS — M5126 Other intervertebral disc displacement, lumbar region: Secondary | ICD-10-CM | POA: Diagnosis not present

## 2016-11-27 DIAGNOSIS — F1120 Opioid dependence, uncomplicated: Secondary | ICD-10-CM | POA: Diagnosis not present

## 2016-11-27 DIAGNOSIS — M9983 Other biomechanical lesions of lumbar region: Secondary | ICD-10-CM | POA: Diagnosis not present

## 2016-11-27 DIAGNOSIS — Z79891 Long term (current) use of opiate analgesic: Secondary | ICD-10-CM | POA: Diagnosis not present

## 2016-11-27 DIAGNOSIS — Z79899 Other long term (current) drug therapy: Secondary | ICD-10-CM | POA: Diagnosis not present

## 2016-11-27 DIAGNOSIS — M47816 Spondylosis without myelopathy or radiculopathy, lumbar region: Secondary | ICD-10-CM | POA: Diagnosis not present

## 2017-01-04 ENCOUNTER — Other Ambulatory Visit: Payer: Self-pay | Admitting: Cardiothoracic Surgery

## 2017-01-05 ENCOUNTER — Other Ambulatory Visit: Payer: Self-pay | Admitting: Cardiothoracic Surgery

## 2017-01-05 DIAGNOSIS — G8929 Other chronic pain: Secondary | ICD-10-CM | POA: Diagnosis not present

## 2017-01-05 DIAGNOSIS — Z23 Encounter for immunization: Secondary | ICD-10-CM | POA: Diagnosis not present

## 2017-01-05 DIAGNOSIS — I712 Thoracic aortic aneurysm, without rupture, unspecified: Secondary | ICD-10-CM

## 2017-01-05 DIAGNOSIS — F1120 Opioid dependence, uncomplicated: Secondary | ICD-10-CM | POA: Diagnosis not present

## 2017-01-19 DIAGNOSIS — J069 Acute upper respiratory infection, unspecified: Secondary | ICD-10-CM | POA: Diagnosis not present

## 2017-01-19 DIAGNOSIS — I712 Thoracic aortic aneurysm, without rupture: Secondary | ICD-10-CM | POA: Diagnosis not present

## 2017-01-19 DIAGNOSIS — N401 Enlarged prostate with lower urinary tract symptoms: Secondary | ICD-10-CM | POA: Diagnosis not present

## 2017-01-19 DIAGNOSIS — F3341 Major depressive disorder, recurrent, in partial remission: Secondary | ICD-10-CM | POA: Diagnosis not present

## 2017-01-31 ENCOUNTER — Ambulatory Visit: Payer: Commercial Managed Care - HMO | Admitting: Cardiothoracic Surgery

## 2017-01-31 ENCOUNTER — Ambulatory Visit
Admission: RE | Admit: 2017-01-31 | Discharge: 2017-01-31 | Disposition: A | Discharge status: Home / Self Care | Payer: Commercial Managed Care - HMO | Source: Ambulatory Visit | Attending: Cardiothoracic Surgery | Admitting: Cardiothoracic Surgery

## 2017-01-31 DIAGNOSIS — I712 Thoracic aortic aneurysm, without rupture, unspecified: Secondary | ICD-10-CM

## 2017-01-31 DIAGNOSIS — J439 Emphysema, unspecified: Secondary | ICD-10-CM | POA: Diagnosis not present

## 2017-02-14 ENCOUNTER — Ambulatory Visit: Payer: Medicare HMO | Admitting: Cardiothoracic Surgery

## 2017-02-14 ENCOUNTER — Encounter: Payer: Self-pay | Admitting: Cardiothoracic Surgery

## 2017-02-14 VITALS — BP 150/95 | HR 78 | Resp 20 | Ht 73.0 in | Wt 215.0 lb

## 2017-02-14 DIAGNOSIS — D381 Neoplasm of uncertain behavior of trachea, bronchus and lung: Secondary | ICD-10-CM

## 2017-02-14 DIAGNOSIS — I712 Thoracic aortic aneurysm, without rupture: Secondary | ICD-10-CM | POA: Diagnosis not present

## 2017-02-14 DIAGNOSIS — I7121 Aneurysm of the ascending aorta, without rupture: Secondary | ICD-10-CM

## 2017-02-14 NOTE — Progress Notes (Signed)
PCP is Seward Carol, MD Referring Provider is Seward Carol, MD  Chief Complaint  Patient presents with  . Thoracic Aortic Aneurysm    1 year f/u with Chest CT 01/31/17    HPI: Scheduled 1 year follow-up with CT scan without contrast for a 4.4 cm fusiform ascending thoracic aneurysm, asymptomatic. Initial consultation was last year. He remains asymptomatic. On CT scan last week the ascending aorta measures 4.4 cm without thickening hematoma or ulceration. The patient has underlying COPD. He has history of asbestos exposure. He has some stable plaques in the right upper lobe proximal a 1.3 cm which have not changed. The patient stopped smoking over a year ago. No other suspicious findings of the lungs or mediastinum on his current CT scan..  The patient has some dyspnea on exertion. He denies angina. He has hypertension and his systolic blood pressure has been 150-160 past several months. He is recommended to increase his Norvasc dose to 10 mg a day. He is currently taking losartan/HCTZ 100/25 milligrams daily as well.  The patient has no cardiac murmur and has not had a echocardiogram.  Past Medical History:  Diagnosis Date  . COPD (chronic obstructive pulmonary disease) (HCC)    has not used albuterol inhaler for last 2 months  . Depression   . GERD (gastroesophageal reflux disease)   . Hypertension     Past Surgical History:  Procedure Laterality Date  . BACK SURGERY    lasdt 1990's   lower x 7  . COLONOSCOPY WITH PROPOFOL N/A 06/25/2012   Procedure: COLONOSCOPY WITH PROPOFOL;  Surgeon: Garlan Fair, MD;  Location: [facility];  Service: Endoscopy;  Laterality: N/A;  . ESOPHAGOGASTRODUODENOSCOPY (EGD) WITH PROPOFOL N/A 06/25/2012   Procedure: ESOPHAGOGASTRODUODENOSCOPY (EGD) WITH PROPOFOL;  Surgeon: Garlan Fair, MD;  Location: [facility];  Service: Endoscopy;  Laterality: N/A;    Family History  Problem Relation Age of Onset  . Allergies Daughter   . Allergies  Son   . Leukemia Sister   . Prostate cancer Father     Social History Social History   Tobacco Use  . Smoking status: Former Smoker    Packs/day: 0.50    Years: 50.00    Pack years: 25.00    Types: Cigarettes    Last attempt to quit: 02/07/2011    Years since quitting: 6.0  . Smokeless tobacco: Never Used  Substance Use Topics  . Alcohol use: Yes    Alcohol/week: 0.0 oz    Comment: occasional  . Drug use: No    Current Outpatient Medications  Medication Sig Dispense Refill  . amLODipine (NORVASC) 5 MG tablet Take 5 mg by mouth daily.    Marland Kitchen aspirin 81 MG tablet Take 81 mg by mouth daily.    . cetirizine (ZYRTEC) 10 MG tablet Take 10 mg by mouth daily.    . citalopram (CELEXA) 40 MG tablet Take 0.5 tablets (20 mg total) by mouth daily. 30 tablet 0  . losartan-hydrochlorothiazide (HYZAAR) 100-25 MG per tablet Take 1 tablet by mouth daily.      No current facility-administered medications for this visit.     No Known Allergies  Review of Systems  He has gained approximately 10 pounds over the past year He has shortness of breath with exertion but no chest pain He denies ankle edema He takes his blood pressure intermittently at home but will increase his blood pressure recordings to help manage his hypertension He has not been hospitalized since his last visit  He has chronic back pain for which he takes MS Contin He has had previous lumbar back surgery. Patient is currently retired.  BP (!) 150/95 (BP Location: Left Arm, Patient Position: Sitting, Cuff Size: Large)   Pulse 78   Resp 20   Ht 6\' 1"  (1.854 m)   Wt 215 lb (97.5 kg)   SpO2 98% Comment: RA  BMI 28.37 kg/m  Physical Exam      Exam    General- alert and comfortable   Lungs- clear without rales, wheezes   Cor- regular rate and rhythm, no murmur , gallop   Abdomen- soft, non-tender   Extremities - warm, non-tender, minimal edema   Neuro- oriented, appropriate, no focal weakness   Diagnostic Tests: CT  scan images personally reviewed. There is a mild small fusiform ascending aneurysm stable at 4.4 cm There is COPD changes with small right upper lobe pleural plaque stable since last exam.  Impression: Hypertension-recommend increasing Norvasc to 10 mg daily Small fusiform ascending thoracic aneurysm, asymptomatic-stable at 4.4 cm. Continue blood pressure control and smoke cessation. Patient recommended to walk 30 - 40 minutes 3 times a week. Plan: Return with CT scan of chest in one year.   Len Childs, MD [facility] [phone]

## 2017-03-02 DIAGNOSIS — I10 Essential (primary) hypertension: Secondary | ICD-10-CM | POA: Diagnosis not present

## 2017-03-02 DIAGNOSIS — R0981 Nasal congestion: Secondary | ICD-10-CM | POA: Diagnosis not present

## 2017-03-02 DIAGNOSIS — F324 Major depressive disorder, single episode, in partial remission: Secondary | ICD-10-CM | POA: Diagnosis not present

## 2017-05-07 DIAGNOSIS — M25511 Pain in right shoulder: Secondary | ICD-10-CM | POA: Diagnosis not present

## 2017-05-07 DIAGNOSIS — M67911 Unspecified disorder of synovium and tendon, right shoulder: Secondary | ICD-10-CM | POA: Diagnosis not present

## 2017-05-14 DIAGNOSIS — M25511 Pain in right shoulder: Secondary | ICD-10-CM | POA: Diagnosis not present

## 2017-05-16 DIAGNOSIS — M67911 Unspecified disorder of synovium and tendon, right shoulder: Secondary | ICD-10-CM | POA: Diagnosis not present

## 2017-06-05 DIAGNOSIS — G8918 Other acute postprocedural pain: Secondary | ICD-10-CM | POA: Diagnosis not present

## 2017-06-05 DIAGNOSIS — M7551 Bursitis of right shoulder: Secondary | ICD-10-CM | POA: Diagnosis not present

## 2017-06-05 DIAGNOSIS — M7521 Bicipital tendinitis, right shoulder: Secondary | ICD-10-CM | POA: Diagnosis not present

## 2017-06-05 DIAGNOSIS — M19011 Primary osteoarthritis, right shoulder: Secondary | ICD-10-CM | POA: Diagnosis not present

## 2017-06-05 DIAGNOSIS — M7541 Impingement syndrome of right shoulder: Secondary | ICD-10-CM | POA: Diagnosis not present

## 2017-06-05 DIAGNOSIS — M659 Synovitis and tenosynovitis, unspecified: Secondary | ICD-10-CM | POA: Diagnosis not present

## 2017-06-13 DIAGNOSIS — M19011 Primary osteoarthritis, right shoulder: Secondary | ICD-10-CM | POA: Diagnosis not present

## 2017-06-25 DIAGNOSIS — J309 Allergic rhinitis, unspecified: Secondary | ICD-10-CM | POA: Diagnosis not present

## 2017-06-25 DIAGNOSIS — R05 Cough: Secondary | ICD-10-CM | POA: Diagnosis not present

## 2017-07-27 DIAGNOSIS — J309 Allergic rhinitis, unspecified: Secondary | ICD-10-CM | POA: Diagnosis not present

## 2017-08-23 DIAGNOSIS — F3341 Major depressive disorder, recurrent, in partial remission: Secondary | ICD-10-CM | POA: Diagnosis not present

## 2017-08-23 DIAGNOSIS — J309 Allergic rhinitis, unspecified: Secondary | ICD-10-CM | POA: Diagnosis not present

## 2017-08-23 DIAGNOSIS — Z0000 Encounter for general adult medical examination without abnormal findings: Secondary | ICD-10-CM | POA: Diagnosis not present

## 2017-08-23 DIAGNOSIS — I10 Essential (primary) hypertension: Secondary | ICD-10-CM | POA: Diagnosis not present

## 2017-08-23 DIAGNOSIS — Z23 Encounter for immunization: Secondary | ICD-10-CM | POA: Diagnosis not present

## 2017-08-23 DIAGNOSIS — Z125 Encounter for screening for malignant neoplasm of prostate: Secondary | ICD-10-CM | POA: Diagnosis not present

## 2017-08-23 DIAGNOSIS — Z6830 Body mass index (BMI) 30.0-30.9, adult: Secondary | ICD-10-CM | POA: Diagnosis not present

## 2017-08-23 DIAGNOSIS — I712 Thoracic aortic aneurysm, without rupture: Secondary | ICD-10-CM | POA: Diagnosis not present

## 2017-08-23 DIAGNOSIS — Z1389 Encounter for screening for other disorder: Secondary | ICD-10-CM | POA: Diagnosis not present

## 2017-09-25 DIAGNOSIS — H25813 Combined forms of age-related cataract, bilateral: Secondary | ICD-10-CM | POA: Diagnosis not present

## 2017-09-28 DIAGNOSIS — M7989 Other specified soft tissue disorders: Secondary | ICD-10-CM | POA: Diagnosis not present

## 2017-09-28 DIAGNOSIS — R202 Paresthesia of skin: Secondary | ICD-10-CM | POA: Diagnosis not present

## 2017-10-05 DIAGNOSIS — K219 Gastro-esophageal reflux disease without esophagitis: Secondary | ICD-10-CM | POA: Diagnosis not present

## 2017-10-05 DIAGNOSIS — Z86010 Personal history of colonic polyps: Secondary | ICD-10-CM | POA: Diagnosis not present

## 2017-10-31 DIAGNOSIS — H2511 Age-related nuclear cataract, right eye: Secondary | ICD-10-CM | POA: Diagnosis not present

## 2017-10-31 DIAGNOSIS — Z01818 Encounter for other preprocedural examination: Secondary | ICD-10-CM | POA: Diagnosis not present

## 2017-10-31 DIAGNOSIS — H25811 Combined forms of age-related cataract, right eye: Secondary | ICD-10-CM | POA: Diagnosis not present

## 2017-11-09 DIAGNOSIS — K635 Polyp of colon: Secondary | ICD-10-CM | POA: Diagnosis not present

## 2017-11-09 DIAGNOSIS — D122 Benign neoplasm of ascending colon: Secondary | ICD-10-CM | POA: Diagnosis not present

## 2017-11-09 DIAGNOSIS — K640 First degree hemorrhoids: Secondary | ICD-10-CM | POA: Diagnosis not present

## 2017-11-09 DIAGNOSIS — Z86010 Personal history of colonic polyps: Secondary | ICD-10-CM | POA: Diagnosis not present

## 2017-11-13 DIAGNOSIS — D122 Benign neoplasm of ascending colon: Secondary | ICD-10-CM | POA: Diagnosis not present

## 2017-11-13 DIAGNOSIS — K635 Polyp of colon: Secondary | ICD-10-CM | POA: Diagnosis not present

## 2017-12-18 DIAGNOSIS — H33311 Horseshoe tear of retina without detachment, right eye: Secondary | ICD-10-CM | POA: Diagnosis not present

## 2017-12-18 DIAGNOSIS — H43811 Vitreous degeneration, right eye: Secondary | ICD-10-CM | POA: Diagnosis not present

## 2017-12-28 DIAGNOSIS — H33311 Horseshoe tear of retina without detachment, right eye: Secondary | ICD-10-CM | POA: Diagnosis not present

## 2017-12-28 DIAGNOSIS — H26491 Other secondary cataract, right eye: Secondary | ICD-10-CM | POA: Diagnosis not present

## 2018-01-15 DIAGNOSIS — M65311 Trigger thumb, right thumb: Secondary | ICD-10-CM | POA: Diagnosis not present

## 2018-01-15 DIAGNOSIS — Z23 Encounter for immunization: Secondary | ICD-10-CM | POA: Diagnosis not present

## 2018-01-17 ENCOUNTER — Other Ambulatory Visit: Payer: Self-pay | Admitting: Cardiothoracic Surgery

## 2018-01-17 DIAGNOSIS — I712 Thoracic aortic aneurysm, without rupture, unspecified: Secondary | ICD-10-CM

## 2018-01-22 DIAGNOSIS — M65312 Trigger thumb, left thumb: Secondary | ICD-10-CM | POA: Diagnosis not present

## 2018-01-22 DIAGNOSIS — M65331 Trigger finger, right middle finger: Secondary | ICD-10-CM | POA: Diagnosis not present

## 2018-01-22 DIAGNOSIS — M65311 Trigger thumb, right thumb: Secondary | ICD-10-CM | POA: Diagnosis not present

## 2018-01-22 DIAGNOSIS — M65332 Trigger finger, left middle finger: Secondary | ICD-10-CM | POA: Diagnosis not present

## 2018-02-11 DIAGNOSIS — M65321 Trigger finger, right index finger: Secondary | ICD-10-CM | POA: Diagnosis not present

## 2018-02-11 DIAGNOSIS — M65331 Trigger finger, right middle finger: Secondary | ICD-10-CM | POA: Diagnosis not present

## 2018-02-27 ENCOUNTER — Ambulatory Visit: Payer: Medicare HMO | Admitting: Cardiothoracic Surgery

## 2018-02-27 ENCOUNTER — Encounter: Payer: Self-pay | Admitting: Cardiothoracic Surgery

## 2018-02-27 ENCOUNTER — Other Ambulatory Visit: Payer: Self-pay

## 2018-02-27 ENCOUNTER — Ambulatory Visit
Admission: RE | Admit: 2018-02-27 | Discharge: 2018-02-27 | Disposition: A | Discharge status: Home / Self Care | Payer: Medicare HMO | Source: Ambulatory Visit | Attending: Cardiothoracic Surgery | Admitting: Cardiothoracic Surgery

## 2018-02-27 VITALS — BP 123/84 | HR 63 | Resp 16 | Ht 73.0 in | Wt 223.0 lb

## 2018-02-27 DIAGNOSIS — I712 Thoracic aortic aneurysm, without rupture, unspecified: Secondary | ICD-10-CM

## 2018-02-27 DIAGNOSIS — J432 Centrilobular emphysema: Secondary | ICD-10-CM | POA: Diagnosis not present

## 2018-02-27 NOTE — Progress Notes (Addendum)
PCP is Seward Carol, MD Referring Provider is Seward Carol, MD  Chief Complaint  Patient presents with  . TAA    1 yr f/u with CT CHEST  . Lung Lesion    RLLOBE    HPI: 1 year follow-up with CT scanning of chest for stable asymptomatic fusiform ascending aneurysm measuring 4.3 cm in diameter.  I was first consulted on this patient 2 years ago 2018 and the aortic diameter is not increased.  He has hypertension but is compliant with his medications including Norvasc and Hyzaar.  He stopped smoking 2 years ago.  His CT scan shows the aorta to be mildly dilated without mural thickening and with mild calcification.  No changes in the past year.  Lungs show evidence of COPD but no at risk pulmonary nodules or adenopathy.  Past Medical History:  Diagnosis Date  . COPD (chronic obstructive pulmonary disease) (HCC)    has not used albuterol inhaler for last 2 months  . Depression   . GERD (gastroesophageal reflux disease)   . Hypertension     Past Surgical History:  Procedure Laterality Date  . BACK SURGERY    lasdt 1990's   lower x 7  . COLONOSCOPY WITH PROPOFOL N/A 06/25/2012   Procedure: COLONOSCOPY WITH PROPOFOL;  Surgeon: Garlan Fair, MD;  Location: [facility];  Service: Endoscopy;  Laterality: N/A;  . ESOPHAGOGASTRODUODENOSCOPY (EGD) WITH PROPOFOL N/A 06/25/2012   Procedure: ESOPHAGOGASTRODUODENOSCOPY (EGD) WITH PROPOFOL;  Surgeon: Garlan Fair, MD;  Location: [facility];  Service: Endoscopy;  Laterality: N/A;    Family History  Problem Relation Age of Onset  . Allergies Daughter   . Allergies Son   . Leukemia Sister   . Prostate cancer Father     Social History Social History   Tobacco Use  . Smoking status: Former Smoker    Packs/day: 0.50    Years: 50.00    Pack years: 25.00    Types: Cigarettes    Last attempt to quit: 02/07/2011    Years since quitting: 7.0  . Smokeless tobacco: Never Used  Substance Use Topics  . Alcohol use: Yes    Alcohol/week:  0.0 standard drinks    Comment: occasional  . Drug use: No    Current Outpatient Medications  Medication Sig Dispense Refill  . amLODipine (NORVASC) 5 MG tablet Take 5 mg by mouth daily.    Marland Kitchen aspirin 81 MG tablet Take 81 mg by mouth daily.    . cetirizine (ZYRTEC) 10 MG tablet Take 10 mg by mouth daily.    . citalopram (CELEXA) 40 MG tablet Take 0.5 tablets (20 mg total) by mouth daily. 30 tablet 0  . fluticasone (FLONASE) 50 MCG/ACT nasal spray Place 1 spray into both nostrils daily.    Marland Kitchen losartan-hydrochlorothiazide (HYZAAR) 100-25 MG per tablet Take 1 tablet by mouth daily.     . montelukast (SINGULAIR) 10 MG tablet Take 10 mg by mouth at bedtime.    . pantoprazole (PROTONIX) 40 MG tablet Take 40 mg by mouth daily.     No current facility-administered medications for this visit.     No Known Allergies  Review of Systems   He has had office treatment of a left hand trigger finger Complains of numbness and burning of his feet but not related to exertion No chest pain No difficulty swallowing No weight loss No abdominal pain No dizziness or syncope  BP 123/84 (BP Location: Right Arm, Patient Position: Sitting, Cuff Size: Large)  Pulse 63   Resp 16   Ht 6\' 1"  (1.854 m)   Wt 223 lb (101.2 kg)   SpO2 97% Comment: ON RA  BMI 29.42 kg/m  Physical Exam      Exam    General- alert and comfortable    Neck- no JVD, no cervical adenopathy palpable, no carotid bruit   Lungs- clear without rales, wheezes   Cor- regular rate and rhythm, no murmur , gallop   Abdomen- soft, non-tender   Extremities - warm, non-tender, minimal edema   Neuro- oriented, appropriate, no focal weakness   Diagnostic Tests: CT scan images personally reviewed showing a stable mild-moderate fusiform ascending aneurysm 4.3 cm diameter.  Stable for the past 2 years.  There is a small round smooth nodule in the right lower lobe which has been stable for the past 2 years, probable subpleural lymph  node.  Impression patient understands importance of blood pressure control to prevent further dilatation of the aorta and to prevent risk of aortic wall tear/dissection.  Best therapy is blood pressure control, smoking cessation, and surveillance scans annually.   Plan: Patient return in 1 year with CT scan of the chest without contrast.   Len Childs, MD [facility] [phone]

## 2018-04-13 IMAGING — CR DG ABDOMEN 2V
2 series · 2 of 2 positions shown · non-contrast
Comparison: 06/03/2015.  CT 02/06/2011.

CLINICAL DATA: Abdominal pain.

EXAM:
ABDOMEN - 2 VIEW

[w abdomen upright *]
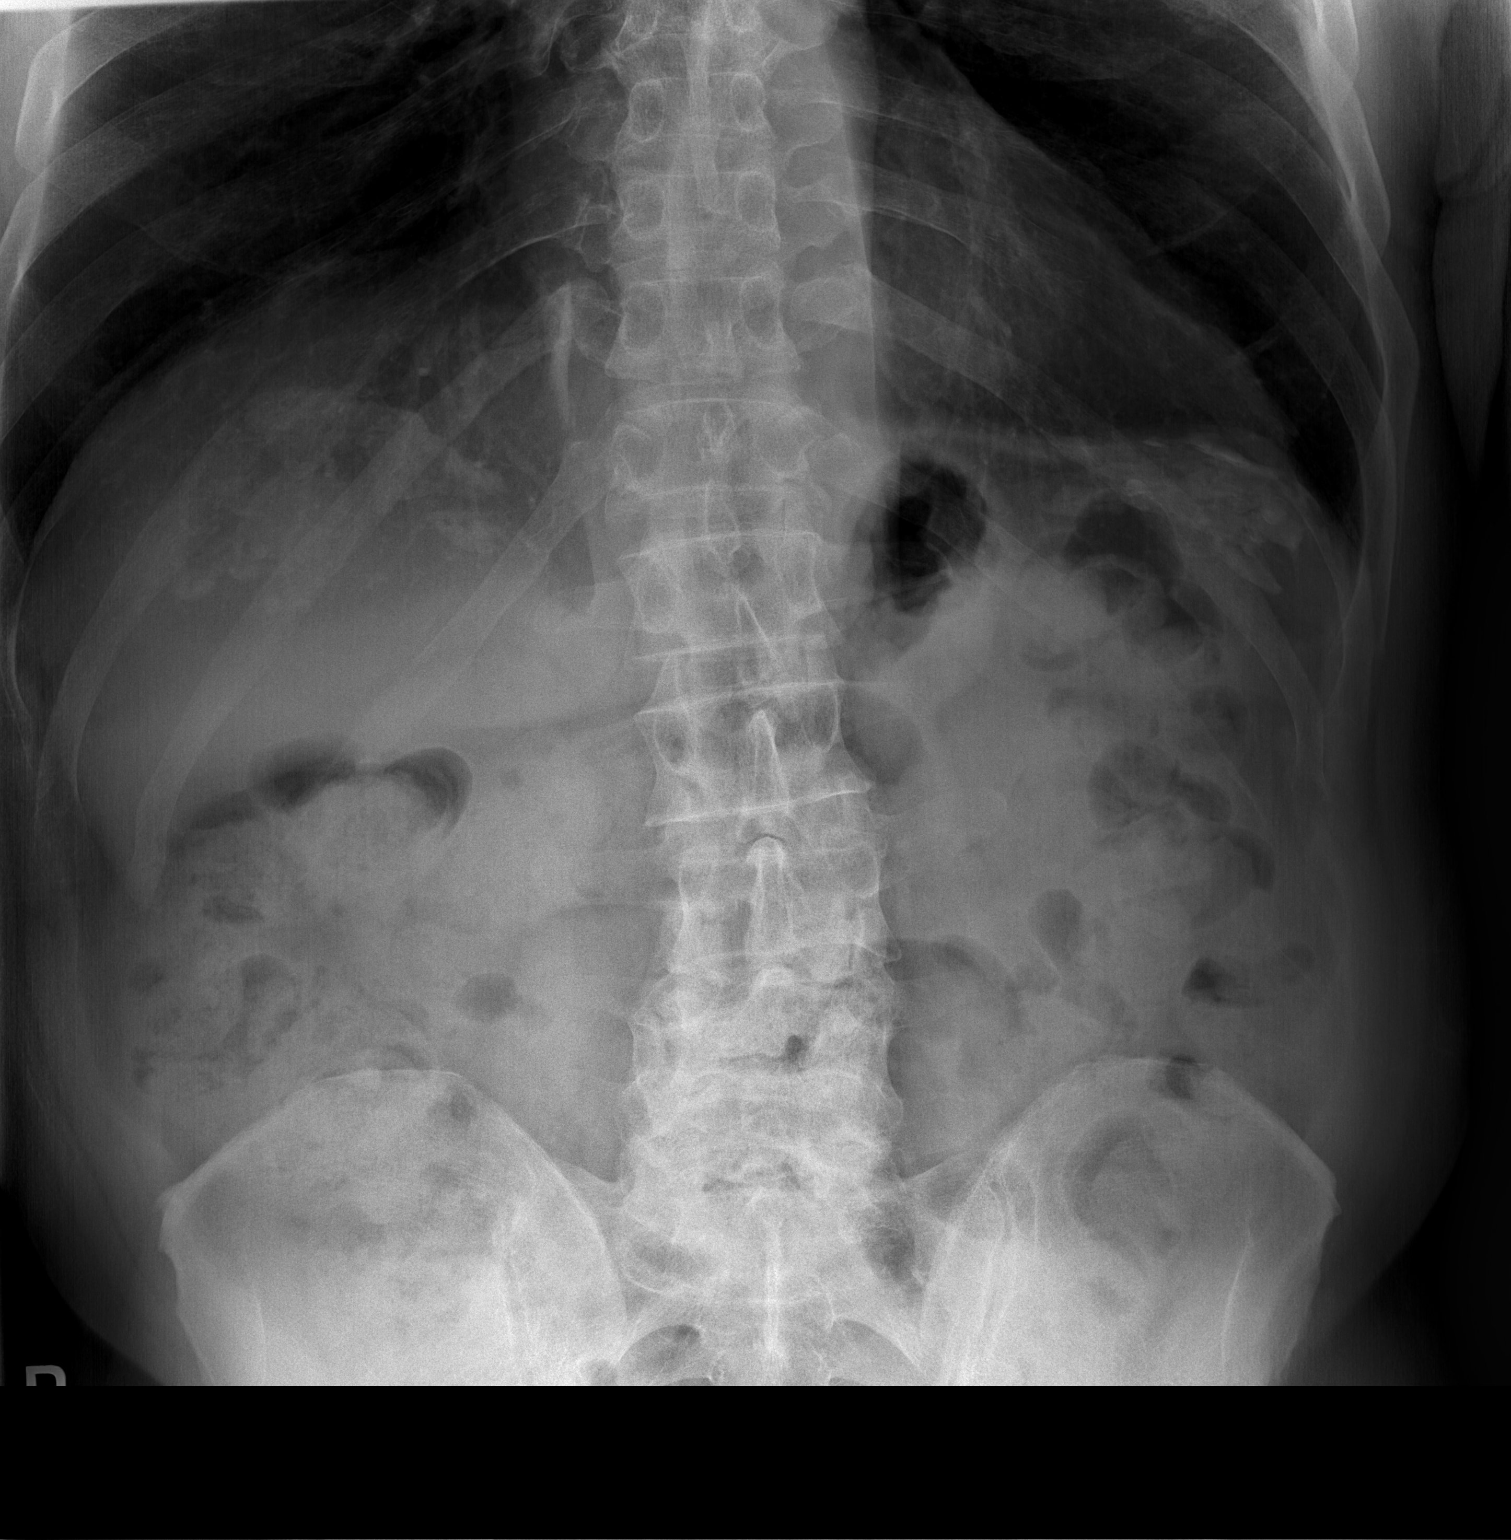

[t abdomen supine]
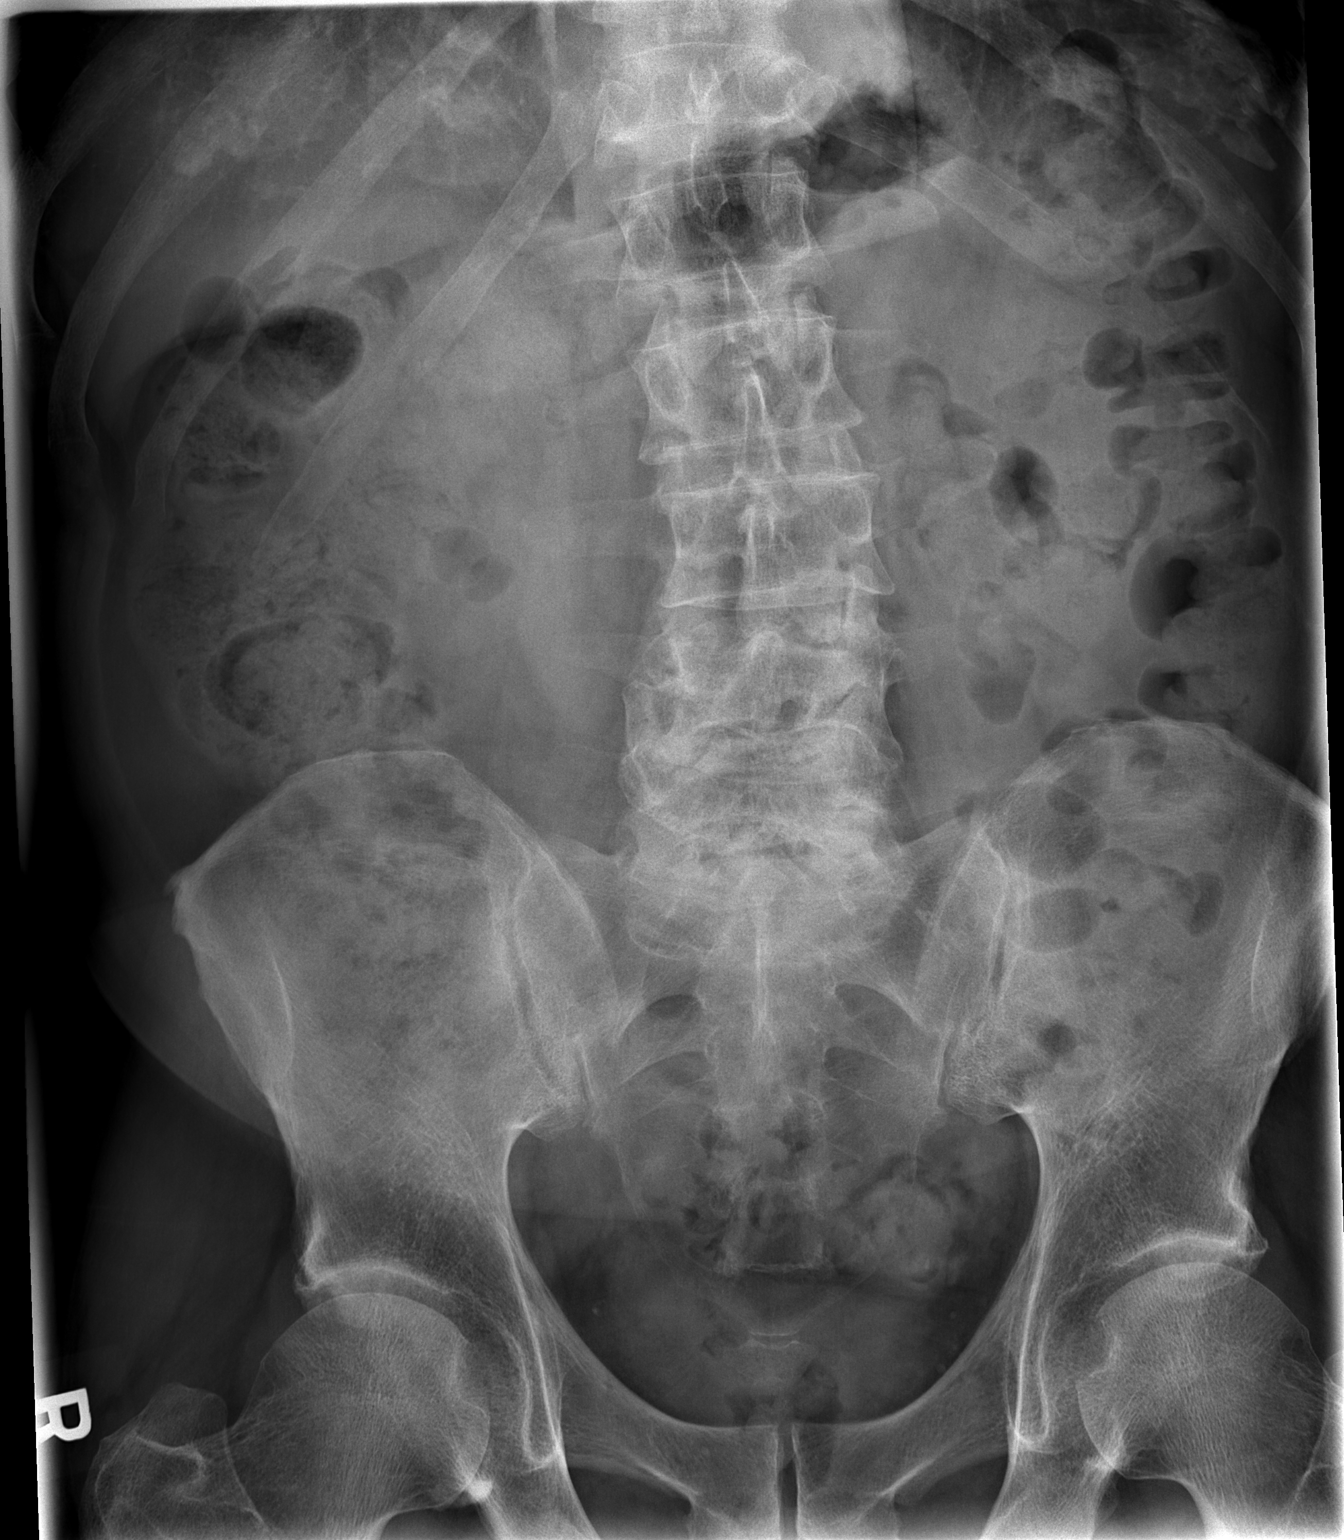

[2 of 2 positions shown; findings below may reference images not displayed]

FINDINGS: Calcified diaphragmatic plaques again noted. This may be from prior
asbestos exposure . Prominent amount stool noted throughout the
colon consistent with constipation. Pelvic calcifications most
likely secondary to phleboliths. Diffuse degenerative change lumbar
spine with mild scoliosis concave right. Degenerative changes both
hips .
IMPRESSION: Prominent stool noted throughout the colon consistent constipation.
No bowel distention.

## 2018-09-18 DIAGNOSIS — I7789 Other specified disorders of arteries and arterioles: Secondary | ICD-10-CM | POA: Diagnosis not present

## 2018-09-18 DIAGNOSIS — Z1389 Encounter for screening for other disorder: Secondary | ICD-10-CM | POA: Diagnosis not present

## 2018-09-18 DIAGNOSIS — K219 Gastro-esophageal reflux disease without esophagitis: Secondary | ICD-10-CM | POA: Diagnosis not present

## 2018-09-18 DIAGNOSIS — Z0000 Encounter for general adult medical examination without abnormal findings: Secondary | ICD-10-CM | POA: Diagnosis not present

## 2018-09-18 DIAGNOSIS — F339 Major depressive disorder, recurrent, unspecified: Secondary | ICD-10-CM | POA: Diagnosis not present

## 2018-09-18 DIAGNOSIS — I10 Essential (primary) hypertension: Secondary | ICD-10-CM | POA: Diagnosis not present

## 2018-09-18 DIAGNOSIS — J439 Emphysema, unspecified: Secondary | ICD-10-CM | POA: Diagnosis not present

## 2018-09-18 DIAGNOSIS — M545 Low back pain: Secondary | ICD-10-CM | POA: Diagnosis not present

## 2018-09-18 DIAGNOSIS — Z125 Encounter for screening for malignant neoplasm of prostate: Secondary | ICD-10-CM | POA: Diagnosis not present

## 2018-10-08 DIAGNOSIS — M4126 Other idiopathic scoliosis, lumbar region: Secondary | ICD-10-CM | POA: Diagnosis not present

## 2018-10-08 DIAGNOSIS — M9983 Other biomechanical lesions of lumbar region: Secondary | ICD-10-CM | POA: Diagnosis not present

## 2018-10-08 DIAGNOSIS — M47816 Spondylosis without myelopathy or radiculopathy, lumbar region: Secondary | ICD-10-CM | POA: Diagnosis not present

## 2018-10-08 DIAGNOSIS — M546 Pain in thoracic spine: Secondary | ICD-10-CM | POA: Diagnosis not present

## 2018-10-08 DIAGNOSIS — M4156 Other secondary scoliosis, lumbar region: Secondary | ICD-10-CM | POA: Diagnosis not present

## 2018-10-08 DIAGNOSIS — Z9889 Other specified postprocedural states: Secondary | ICD-10-CM | POA: Diagnosis not present

## 2018-10-08 DIAGNOSIS — M542 Cervicalgia: Secondary | ICD-10-CM | POA: Diagnosis not present

## 2018-10-08 DIAGNOSIS — M5136 Other intervertebral disc degeneration, lumbar region: Secondary | ICD-10-CM | POA: Diagnosis not present

## 2018-11-22 DIAGNOSIS — M5416 Radiculopathy, lumbar region: Secondary | ICD-10-CM | POA: Diagnosis not present

## 2018-11-22 DIAGNOSIS — Z9889 Other specified postprocedural states: Secondary | ICD-10-CM | POA: Diagnosis not present

## 2018-11-22 DIAGNOSIS — M5136 Other intervertebral disc degeneration, lumbar region: Secondary | ICD-10-CM | POA: Diagnosis not present

## 2018-11-22 DIAGNOSIS — M47816 Spondylosis without myelopathy or radiculopathy, lumbar region: Secondary | ICD-10-CM | POA: Diagnosis not present

## 2018-11-27 ENCOUNTER — Other Ambulatory Visit: Payer: Self-pay | Admitting: Neurosurgery

## 2018-11-27 DIAGNOSIS — Z23 Encounter for immunization: Secondary | ICD-10-CM | POA: Diagnosis not present

## 2018-11-27 DIAGNOSIS — M5416 Radiculopathy, lumbar region: Secondary | ICD-10-CM

## 2018-12-20 ENCOUNTER — Ambulatory Visit
Admission: RE | Admit: 2018-12-20 | Discharge: 2018-12-20 | Disposition: A | Discharge status: Home / Self Care | Payer: Medicare HMO | Source: Ambulatory Visit | Attending: Neurosurgery | Admitting: Neurosurgery

## 2018-12-20 ENCOUNTER — Other Ambulatory Visit: Payer: Self-pay

## 2018-12-20 DIAGNOSIS — M48061 Spinal stenosis, lumbar region without neurogenic claudication: Secondary | ICD-10-CM | POA: Diagnosis not present

## 2018-12-20 DIAGNOSIS — M5416 Radiculopathy, lumbar region: Secondary | ICD-10-CM

## 2018-12-20 MED ORDER — GADOBENATE DIMEGLUMINE 529 MG/ML IV SOLN
20.0000 mL | Freq: Once | INTRAVENOUS | Status: AC | PRN
  Administered 2018-12-20: 20 mL via INTRAVENOUS

## 2019-01-10 DIAGNOSIS — M5416 Radiculopathy, lumbar region: Secondary | ICD-10-CM | POA: Diagnosis not present

## 2019-01-10 DIAGNOSIS — M48061 Spinal stenosis, lumbar region without neurogenic claudication: Secondary | ICD-10-CM | POA: Diagnosis not present

## 2019-01-10 DIAGNOSIS — M47816 Spondylosis without myelopathy or radiculopathy, lumbar region: Secondary | ICD-10-CM | POA: Diagnosis not present

## 2019-01-10 DIAGNOSIS — M48062 Spinal stenosis, lumbar region with neurogenic claudication: Secondary | ICD-10-CM | POA: Diagnosis not present

## 2019-01-16 DIAGNOSIS — M4726 Other spondylosis with radiculopathy, lumbar region: Secondary | ICD-10-CM | POA: Diagnosis not present

## 2019-01-16 DIAGNOSIS — M5136 Other intervertebral disc degeneration, lumbar region: Secondary | ICD-10-CM | POA: Diagnosis not present

## 2019-01-16 DIAGNOSIS — M48061 Spinal stenosis, lumbar region without neurogenic claudication: Secondary | ICD-10-CM | POA: Diagnosis not present

## 2019-01-21 ENCOUNTER — Other Ambulatory Visit: Payer: Self-pay | Admitting: Cardiothoracic Surgery

## 2019-01-21 DIAGNOSIS — I712 Thoracic aortic aneurysm, without rupture, unspecified: Secondary | ICD-10-CM

## 2019-03-05 ENCOUNTER — Ambulatory Visit
Admission: RE | Admit: 2019-03-05 | Discharge: 2019-03-05 | Disposition: A | Discharge status: Home / Self Care | Payer: Medicare HMO | Source: Ambulatory Visit | Attending: Cardiothoracic Surgery | Admitting: Cardiothoracic Surgery

## 2019-03-05 ENCOUNTER — Other Ambulatory Visit: Payer: Self-pay

## 2019-03-05 ENCOUNTER — Encounter: Payer: Self-pay | Admitting: Cardiothoracic Surgery

## 2019-03-05 ENCOUNTER — Ambulatory Visit: Payer: Medicare HMO | Admitting: Cardiothoracic Surgery

## 2019-03-05 VITALS — BP 128/84 | HR 67 | Temp 97.0°F | Resp 16 | Ht 73.0 in | Wt 225.0 lb

## 2019-03-05 DIAGNOSIS — I712 Thoracic aortic aneurysm, without rupture, unspecified: Secondary | ICD-10-CM | POA: Insufficient documentation

## 2019-03-05 NOTE — Progress Notes (Signed)
PCP is Seward Carol, MD Referring Provider is Seward Carol, MD  Chief Complaint  Patient presents with  . Thoracic Aortic Aneurysm    1 year with Chest CT    HPI: 1 year follow-up with CT scan of the chest to follow a 4.2 cm asymptomatic fusiform aneurysm first noted 3 years ago.  Patient has blood pressure but well controlled with losartan and Norvasc.  Review of his blood pressures in the medical record are satisfactory.  He denies any chest pain or dyspnea.  I reviewed the images of his chest CT scan without contrast performed today [last creatinine 1.3] This shows no change in the 4.2 cm smooth ascending fusiform aneurysm.  There is no mural thickening ulceration or hematoma.  Lung windows show no change in some small calcifications/plaque of the right lower lobe visceral pleura stable for over 2 years.  No other suspicious pulmonary lesions.  Past Medical History:  Diagnosis Date  . COPD (chronic obstructive pulmonary disease) (HCC)    has not used albuterol inhaler for last 2 months  . Depression   . GERD (gastroesophageal reflux disease)   . Hypertension     Past Surgical History:  Procedure Laterality Date  . BACK SURGERY    lasdt 1990's   lower x 7  . COLONOSCOPY WITH PROPOFOL N/A 06/25/2012   Procedure: COLONOSCOPY WITH PROPOFOL;  Surgeon: Garlan Fair, MD;  Location: [facility];  Service: Endoscopy;  Laterality: N/A;  . ESOPHAGOGASTRODUODENOSCOPY (EGD) WITH PROPOFOL N/A 06/25/2012   Procedure: ESOPHAGOGASTRODUODENOSCOPY (EGD) WITH PROPOFOL;  Surgeon: Garlan Fair, MD;  Location: [facility];  Service: Endoscopy;  Laterality: N/A;    Family History  Problem Relation Age of Onset  . Allergies Daughter   . Allergies Son   . Leukemia Sister   . Prostate cancer Father     Social History Social History   Tobacco Use  . Smoking status: Former Smoker    Packs/day: 0.50    Years: 50.00    Pack years: 25.00    Types: Cigarettes    Quit date: 02/07/2011    Years since quitting: 8.0  . Smokeless tobacco: Never Used  Substance Use Topics  . Alcohol use: Yes    Alcohol/week: 0.0 standard drinks    Comment: occasional  . Drug use: No    Current Outpatient Medications  Medication Sig Dispense Refill  . amLODipine (NORVASC) 5 MG tablet Take 5 mg by mouth daily.    Marland Kitchen aspirin 81 MG tablet Take 81 mg by mouth daily.    . cetirizine (ZYRTEC) 10 MG tablet Take 10 mg by mouth daily.    . citalopram (CELEXA) 40 MG tablet Take 0.5 tablets (20 mg total) by mouth daily. 30 tablet 0  . fluticasone (FLONASE) 50 MCG/ACT nasal spray Place 1 spray into both nostrils daily.    Marland Kitchen losartan-hydrochlorothiazide (HYZAAR) 100-25 MG per tablet Take 1 tablet by mouth daily.     . montelukast (SINGULAIR) 10 MG tablet Take 10 mg by mouth at bedtime.    . pantoprazole (PROTONIX) 40 MG tablet Take 40 mg by mouth daily.     No current facility-administered medications for this visit.    No Known Allergies  Review of Systems  No fever Weight stable No dyspnea or chest pain No dizziness or presyncope No abdominal pain or blood per rectum No ankle edema claudication or leg swelling  BP 128/84 (BP Location: Right Arm)   Pulse 67   Temp (!) 97 F (36.1  C) (Skin)   Resp 16   Ht 6\' 1"  (1.854 m)   Wt 225 lb (102.1 kg)   SpO2 98% Comment: RA  BMI 29.69 kg/m  Physical Exam      Exam    General- alert and comfortable    Neck- no JVD, no cervical adenopathy palpable, no carotid bruit   Lungs- clear without rales, wheezes   Cor- regular rate and rhythm, no murmur , gallop   Abdomen- soft, non-tender   Extremities - warm, non-tender, minimal edema   Neuro- oriented, appropriate, no focal weakness   Diagnostic Tests: CT chest images personally reviewed as noted above.  Stable 4.2 cm asymptomatic fusiform aneurysm  Impression: The ascending thoracic aneurysm is stable and mild.  The risk for aortic dissection remains less than 1%.  Best long-term therapy is  blood pressure control and monitoring.  Plan: Return for CT scan of chest without contrast in 18 months to follow with the 4.2 cm fusiform aneurysm.  Continue with good blood pressure management to keep systolic blood pressure less than 140   Tharon Aquas Adelene Idler, MD [facility] [phone]

## 2019-03-19 DIAGNOSIS — G4700 Insomnia, unspecified: Secondary | ICD-10-CM | POA: Diagnosis not present

## 2019-03-19 DIAGNOSIS — F324 Major depressive disorder, single episode, in partial remission: Secondary | ICD-10-CM | POA: Diagnosis not present

## 2019-03-19 DIAGNOSIS — J439 Emphysema, unspecified: Secondary | ICD-10-CM | POA: Diagnosis not present

## 2019-03-19 DIAGNOSIS — I712 Thoracic aortic aneurysm, without rupture: Secondary | ICD-10-CM | POA: Diagnosis not present

## 2019-03-19 DIAGNOSIS — I10 Essential (primary) hypertension: Secondary | ICD-10-CM | POA: Diagnosis not present

## 2019-05-21 ENCOUNTER — Other Ambulatory Visit (HOSPITAL_COMMUNITY): Payer: Self-pay | Admitting: Otolaryngology

## 2019-05-21 ENCOUNTER — Other Ambulatory Visit: Payer: Self-pay | Admitting: Otolaryngology

## 2019-05-21 DIAGNOSIS — R1312 Dysphagia, oropharyngeal phase: Secondary | ICD-10-CM

## 2019-05-21 DIAGNOSIS — H903 Sensorineural hearing loss, bilateral: Secondary | ICD-10-CM | POA: Diagnosis not present

## 2019-05-21 DIAGNOSIS — H9201 Otalgia, right ear: Secondary | ICD-10-CM | POA: Diagnosis not present

## 2019-05-26 ENCOUNTER — Other Ambulatory Visit: Payer: Self-pay

## 2019-05-26 ENCOUNTER — Ambulatory Visit (HOSPITAL_COMMUNITY): Payer: Medicare HMO

## 2019-05-26 ENCOUNTER — Ambulatory Visit (HOSPITAL_COMMUNITY)
Admission: RE | Admit: 2019-05-26 | Discharge: 2019-05-26 | Disposition: A | Discharge status: Home / Self Care | Payer: Medicare HMO | Source: Ambulatory Visit | Attending: Otolaryngology | Admitting: Otolaryngology

## 2019-05-26 DIAGNOSIS — R1312 Dysphagia, oropharyngeal phase: Secondary | ICD-10-CM | POA: Diagnosis not present

## 2019-05-26 DIAGNOSIS — K449 Diaphragmatic hernia without obstruction or gangrene: Secondary | ICD-10-CM | POA: Diagnosis not present

## 2019-05-26 DIAGNOSIS — K224 Dyskinesia of esophagus: Secondary | ICD-10-CM | POA: Diagnosis not present

## 2019-05-29 ENCOUNTER — Telehealth: Payer: Self-pay

## 2019-05-29 NOTE — Telephone Encounter (Signed)
Received a referral from Dr Benjamine Mola, MD. Patient has dysphagia. Barium swallow shows esophagus is mildly dilated, dysmotility noted, and Schatzki ring distal esophagus.   Called the patient's home. Phone answered and disconnected. Called patient's mobile phone. No answer. Left a message to return call, offering an appointment 06/04/19.

## 2019-06-02 NOTE — Telephone Encounter (Signed)
Patient declines appointment. Records shredded. Dr Kasandra Knudsen Jonathon Bellows Teoh notifed.

## 2019-06-04 ENCOUNTER — Ambulatory Visit: Payer: Medicare HMO | Admitting: Nurse Practitioner

## 2019-08-28 DIAGNOSIS — I10 Essential (primary) hypertension: Secondary | ICD-10-CM | POA: Diagnosis not present

## 2019-08-28 DIAGNOSIS — M4726 Other spondylosis with radiculopathy, lumbar region: Secondary | ICD-10-CM | POA: Diagnosis not present

## 2019-08-28 DIAGNOSIS — Z6829 Body mass index (BMI) 29.0-29.9, adult: Secondary | ICD-10-CM | POA: Diagnosis not present

## 2019-09-04 DIAGNOSIS — M4726 Other spondylosis with radiculopathy, lumbar region: Secondary | ICD-10-CM | POA: Diagnosis not present

## 2019-09-04 DIAGNOSIS — M545 Low back pain: Secondary | ICD-10-CM | POA: Diagnosis not present

## 2019-09-11 ENCOUNTER — Other Ambulatory Visit: Payer: Self-pay | Admitting: Neurosurgery

## 2019-09-11 DIAGNOSIS — M4726 Other spondylosis with radiculopathy, lumbar region: Secondary | ICD-10-CM | POA: Diagnosis not present

## 2019-09-11 DIAGNOSIS — M4316 Spondylolisthesis, lumbar region: Secondary | ICD-10-CM | POA: Diagnosis not present

## 2019-09-16 NOTE — Progress Notes (Signed)
Procedure orders need 2nd sign. Called and left VM for Jessica Hanks, Dr. Lacy Duverney scheduler.

## 2019-09-16 NOTE — Pre-Procedure Instructions (Signed)
[id number] [place], [place] - [address] [place] [address] Phone: [phone] Fax: [phone]     Your procedure is scheduled on Thursday, August 12, from 12:30 PM- 6:44 PM.  Report to [facility] "A" at 10:30 A.M., and check in at the Admitting office.  Call this number if you have problems the morning of surgery:  [phone]  Call [phone] if you have any questions prior to your surgery date Monday-Friday 8am-4pm.    Remember:  Do not eat or drink after midnight the night before your surgery.     Take these medicines the morning of surgery with A SIP OF WATER: amLODipine (NORVASC) cetirizine (ZYRTEC)  citalopram (CELEXA) fluticasone (FLONASE) nasal spray   *Follow your surgeon's instructions on when to stop Aspirin.  If no instructions were given by your surgeon then you will need to call the office to get those instructions.     As of today, STOP taking any Aleve, Naproxen, Ibuprofen, Motrin, Advil, Goody's, BC's, all herbal medications, fish oil, and all vitamins.          The Morning of Surgery:            Do not wear jewelry.            Do not wear lotions, powders, colognes, or deodorant.            Men may shave face and neck.            Do not bring valuables to the hospital.            [facility] is not responsible for any belongings or valuables.  Do NOT Smoke (Tobacco/Vaping) or drink Alcohol 24 hours prior to your procedure.  If you use a CPAP at night, you may bring all equipment for your overnight stay.   Contacts, glasses, dentures or bridgework may not be worn into surgery.      For patients admitted to the hospital, discharge time will be determined by your treatment team.   Patients discharged the day of surgery will not be allowed to drive home, and someone needs to stay with them for 24 hours.    Special instructions:   [facility]- Preparing For  Surgery  Before surgery, you can play an important role. Because skin is not sterile, your skin needs to be as free of germs as possible. You can reduce the number of germs on your skin by washing with CHG (chlorahexidine gluconate) Soap before surgery.  CHG is an antiseptic cleaner which kills germs and bonds with the skin to continue killing germs even after washing.    Oral Hygiene is also important to reduce your risk of infection.  Remember - BRUSH YOUR TEETH THE MORNING OF SURGERY WITH YOUR REGULAR TOOTHPASTE  Please do not use if you have an allergy to CHG or antibacterial soaps. If your skin becomes reddened/irritated stop using the CHG.  Do not shave (including legs and underarms) for at least 48 hours prior to first CHG shower. It is OK to shave your face.  Please follow these instructions carefully.   1. Shower the NIGHT BEFORE SURGERY and the MORNING OF SURGERY with CHG Soap.   2. If you chose to wash your hair, wash your hair first as usual with your normal shampoo.  3. After you shampoo, rinse your hair and body thoroughly to remove the shampoo.  4. Use CHG as  you would any other liquid soap. You can apply CHG directly to the skin and wash gently with a scrungie or a clean washcloth.   5. Apply the CHG Soap to your body ONLY FROM THE NECK DOWN.  Do not use on open wounds or open sores. Avoid contact with your eyes, ears, mouth and genitals (private parts). Wash Face and genitals (private parts)  with your normal soap.   6. Wash thoroughly, paying special attention to the area where your surgery will be performed.  7. Thoroughly rinse your body with warm water from the neck down.  8. DO NOT shower/wash with your normal soap after using and rinsing off the CHG Soap.  9. Pat yourself dry with a CLEAN TOWEL.  10. Wear CLEAN PAJAMAS to bed the night before surgery  11. Place CLEAN SHEETS on your bed the night of your first shower and DO NOT SLEEP WITH PETS.   Day of  Surgery: Wear Clean/Comfortable clothing the morning of surgery. Do not apply any deodorants/lotions.   Remember to brush your teeth WITH YOUR REGULAR TOOTHPASTE.   Please read over the following fact sheets that you were given.

## 2019-09-17 ENCOUNTER — Encounter (HOSPITAL_COMMUNITY)
Admission: RE | Admit: 2019-09-17 | Discharge: 2019-09-17 | Disposition: A | Discharge status: Home / Self Care | Payer: Medicare HMO | Source: Ambulatory Visit | Attending: Neurosurgery | Admitting: Neurosurgery

## 2019-09-17 ENCOUNTER — Encounter (HOSPITAL_COMMUNITY): Payer: Self-pay

## 2019-09-17 ENCOUNTER — Other Ambulatory Visit (HOSPITAL_COMMUNITY)
Admission: RE | Admit: 2019-09-17 | Discharge: 2019-09-17 | Disposition: A | Discharge status: Home / Self Care | Payer: Medicare HMO | Source: Ambulatory Visit | Attending: Neurosurgery | Admitting: Neurosurgery

## 2019-09-17 ENCOUNTER — Other Ambulatory Visit: Payer: Self-pay

## 2019-09-17 ENCOUNTER — Other Ambulatory Visit: Payer: Self-pay | Admitting: Neurosurgery

## 2019-09-17 DIAGNOSIS — I10 Essential (primary) hypertension: Secondary | ICD-10-CM | POA: Insufficient documentation

## 2019-09-17 DIAGNOSIS — Z01818 Encounter for other preprocedural examination: Secondary | ICD-10-CM | POA: Insufficient documentation

## 2019-09-17 DIAGNOSIS — Z20822 Contact with and (suspected) exposure to covid-19: Secondary | ICD-10-CM | POA: Insufficient documentation

## 2019-09-17 DIAGNOSIS — Z01812 Encounter for preprocedural laboratory examination: Secondary | ICD-10-CM | POA: Insufficient documentation

## 2019-09-17 LAB — SURGICAL PCR SCREEN
MRSA, PCR: NEGATIVE
Staphylococcus aureus: NEGATIVE

## 2019-09-17 LAB — BASIC METABOLIC PANEL
Anion gap: 13 (ref 5–15)
BUN: 11 mg/dL (ref 8–23)
CO2: 23 mmol/L (ref 22–32)
Calcium: 9.6 mg/dL (ref 8.9–10.3)
Chloride: 102 mmol/L (ref 98–111)
Creatinine, Ser: 1.24 mg/dL (ref 0.61–1.24)
GFR calc Af Amer: >60 mL/min (ref >60)
GFR calc non Af Amer: 57 mL/min — ABNORMAL LOW (ref >60)
Glucose, Bld: 121 mg/dL — ABNORMAL HIGH (ref 70–99)
Potassium: 3 mmol/L — ABNORMAL LOW (ref 3.5–5.1)
Sodium: 138 mmol/L (ref 135–145)

## 2019-09-17 LAB — CBC
HCT: 46.7 % (ref 39.0–52.0)
Hemoglobin: 16 g/dL (ref 13.0–17.0)
MCH: 30.5 pg (ref 26.0–34.0)
MCHC: 34.3 g/dL (ref 30.0–36.0)
MCV: 89 fL (ref 80.0–100.0)
Platelets: 330 10*3/uL (ref 150–400)
RBC: 5.25 MIL/uL (ref 4.22–5.81)
RDW: 14 % (ref 11.5–15.5)
WBC: 6.1 10*3/uL (ref 4.0–10.5)
nRBC: 0 % (ref 0.0–0.2)

## 2019-09-17 LAB — TYPE AND SCREEN
ABO/RH(D): O NEG
Antibody Screen: NEGATIVE

## 2019-09-17 LAB — SARS CORONAVIRUS 2 (TAT 6-24 HRS): SARS Coronavirus 2: NEGATIVE

## 2019-09-17 NOTE — Progress Notes (Signed)
PCP - Seward Carol Cardiologist - denies; states has seen someone with [facility] in the past  Chest x-ray - N/A EKG - 09/17/19 Stress Test - 2016 ECHO - denies Cardiac Cath - denies  Sleep Study - denies  Aspirin Instructions: pt states has been holding ASA x 1 week. Patient instructed to hold all  NSAID's, herbal medications, fish oil and vitamins 7 days prior to surgery.  Covid testing: 09/17/19 at at [place]. Pt instructed to remain in their car. Educated on Transport planner until [employer].   Anesthesia review:   Patient denies shortness of breath, fever, cough and chest pain at PAT appointment   Patient verbalized understanding of instructions that were given to them at the PAT appointment. Patient was also instructed that they will need to review over the PAT instructions again at home before surgery.

## 2019-09-18 ENCOUNTER — Inpatient Hospital Stay (HOSPITAL_COMMUNITY): Admission: RE | Admit: 2019-09-18 | Payer: Medicare HMO | Source: Ambulatory Visit | Admitting: Neurosurgery

## 2019-09-18 ENCOUNTER — Encounter (HOSPITAL_COMMUNITY): Admission: RE | Payer: Self-pay | Source: Ambulatory Visit

## 2019-09-18 SURGERY — POSTERIOR LUMBAR FUSION 2 LEVEL
Anesthesia: General

## 2019-09-23 DIAGNOSIS — H9190 Unspecified hearing loss, unspecified ear: Secondary | ICD-10-CM | POA: Diagnosis not present

## 2019-09-23 DIAGNOSIS — I712 Thoracic aortic aneurysm, without rupture: Secondary | ICD-10-CM | POA: Diagnosis not present

## 2019-09-23 DIAGNOSIS — Z0000 Encounter for general adult medical examination without abnormal findings: Secondary | ICD-10-CM | POA: Diagnosis not present

## 2019-09-23 DIAGNOSIS — J309 Allergic rhinitis, unspecified: Secondary | ICD-10-CM | POA: Diagnosis not present

## 2019-09-23 DIAGNOSIS — M6530 Trigger finger, unspecified finger: Secondary | ICD-10-CM | POA: Diagnosis not present

## 2019-09-23 DIAGNOSIS — J439 Emphysema, unspecified: Secondary | ICD-10-CM | POA: Diagnosis not present

## 2019-09-23 DIAGNOSIS — I10 Essential (primary) hypertension: Secondary | ICD-10-CM | POA: Diagnosis not present

## 2019-09-23 DIAGNOSIS — F324 Major depressive disorder, single episode, in partial remission: Secondary | ICD-10-CM | POA: Diagnosis not present

## 2019-09-25 ENCOUNTER — Inpatient Hospital Stay (HOSPITAL_COMMUNITY): Payer: Medicare HMO

## 2019-09-25 ENCOUNTER — Inpatient Hospital Stay (HOSPITAL_COMMUNITY): Payer: Medicare HMO | Admitting: Certified Registered Nurse Anesthetist

## 2019-09-25 ENCOUNTER — Encounter (HOSPITAL_COMMUNITY): Payer: Self-pay | Admitting: Neurosurgery

## 2019-09-25 ENCOUNTER — Encounter (HOSPITAL_COMMUNITY)
Admission: RE | Disposition: A | Discharge status: Home / Self Care | Payer: Self-pay | Source: Home / Self Care | Attending: Neurosurgery

## 2019-09-25 ENCOUNTER — Inpatient Hospital Stay (HOSPITAL_COMMUNITY)
Admission: RE | Admit: 2019-09-25 | Discharge: 2019-09-27 | DRG: 460 | Disposition: A | Discharge status: Home / Self Care | Payer: Medicare HMO | Attending: Neurological Surgery | Admitting: Neurological Surgery

## 2019-09-25 ENCOUNTER — Other Ambulatory Visit: Payer: Self-pay

## 2019-09-25 DIAGNOSIS — I10 Essential (primary) hypertension: Secondary | ICD-10-CM | POA: Diagnosis present

## 2019-09-25 DIAGNOSIS — F329 Major depressive disorder, single episode, unspecified: Secondary | ICD-10-CM | POA: Diagnosis present

## 2019-09-25 DIAGNOSIS — M79606 Pain in leg, unspecified: Secondary | ICD-10-CM | POA: Diagnosis present

## 2019-09-25 DIAGNOSIS — Z7982 Long term (current) use of aspirin: Secondary | ICD-10-CM

## 2019-09-25 DIAGNOSIS — J439 Emphysema, unspecified: Secondary | ICD-10-CM | POA: Diagnosis not present

## 2019-09-25 DIAGNOSIS — M48061 Spinal stenosis, lumbar region without neurogenic claudication: Secondary | ICD-10-CM | POA: Diagnosis not present

## 2019-09-25 DIAGNOSIS — M48062 Spinal stenosis, lumbar region with neurogenic claudication: Secondary | ICD-10-CM | POA: Diagnosis not present

## 2019-09-25 DIAGNOSIS — M5136 Other intervertebral disc degeneration, lumbar region: Secondary | ICD-10-CM | POA: Diagnosis not present

## 2019-09-25 DIAGNOSIS — K219 Gastro-esophageal reflux disease without esophagitis: Secondary | ICD-10-CM | POA: Diagnosis not present

## 2019-09-25 DIAGNOSIS — Z981 Arthrodesis status: Secondary | ICD-10-CM | POA: Diagnosis not present

## 2019-09-25 DIAGNOSIS — Z20822 Contact with and (suspected) exposure to covid-19: Secondary | ICD-10-CM | POA: Diagnosis not present

## 2019-09-25 DIAGNOSIS — M4316 Spondylolisthesis, lumbar region: Secondary | ICD-10-CM | POA: Diagnosis not present

## 2019-09-25 DIAGNOSIS — Z419 Encounter for procedure for purposes other than remedying health state, unspecified: Secondary | ICD-10-CM

## 2019-09-25 DIAGNOSIS — Z79899 Other long term (current) drug therapy: Secondary | ICD-10-CM | POA: Diagnosis not present

## 2019-09-25 DIAGNOSIS — J449 Chronic obstructive pulmonary disease, unspecified: Secondary | ICD-10-CM | POA: Diagnosis present

## 2019-09-25 DIAGNOSIS — M4805 Spinal stenosis, thoracolumbar region: Secondary | ICD-10-CM | POA: Diagnosis not present

## 2019-09-25 DIAGNOSIS — Z87891 Personal history of nicotine dependence: Secondary | ICD-10-CM

## 2019-09-25 LAB — SARS CORONAVIRUS 2 BY RT PCR (HOSPITAL ORDER, PERFORMED IN ~~LOC~~ HOSPITAL LAB): SARS Coronavirus 2: NEGATIVE

## 2019-09-25 LAB — POCT I-STAT, CHEM 8
BUN: 12 mg/dL (ref 8–23)
Calcium, Ion: 1.19 mmol/L (ref 1.15–1.40)
Chloride: 103 mmol/L (ref 98–111)
Creatinine, Ser: 1.3 mg/dL — ABNORMAL HIGH (ref 0.61–1.24)
Glucose, Bld: 94 mg/dL (ref 70–99)
HCT: 42 % (ref 39.0–52.0)
Hemoglobin: 14.3 g/dL (ref 13.0–17.0)
Potassium: 2.8 mmol/L — ABNORMAL LOW (ref 3.5–5.1)
Sodium: 142 mmol/L (ref 135–145)
TCO2: 26 mmol/L (ref 22–32)

## 2019-09-25 LAB — ABO/RH: ABO/RH(D): O NEG

## 2019-09-25 SURGERY — POSTERIOR LUMBAR FUSION 2 LEVEL
Anesthesia: General | Site: Spine Lumbar

## 2019-09-25 MED ORDER — SUGAMMADEX SODIUM 200 MG/2ML IV SOLN
INTRAVENOUS | Status: DC | PRN
  Administered 2019-09-25: 400 mg via INTRAVENOUS

## 2019-09-25 MED ORDER — DIAZEPAM 5 MG PO TABS
5.0000 mg | ORAL_TABLET | Freq: Four times a day (QID) | ORAL | Status: DC | PRN
  Administered 2019-09-25 – 2019-09-27 (×5): 5 mg via ORAL
  Filled 2019-09-25 (×4): qty 1

## 2019-09-25 MED ORDER — CHLORHEXIDINE GLUCONATE 0.12 % MT SOLN
15.0000 mL | Freq: Once | OROMUCOSAL | Status: AC
  Administered 2019-09-25: 15 mL via OROMUCOSAL

## 2019-09-25 MED ORDER — TRAZODONE HCL 50 MG PO TABS
50.0000 mg | ORAL_TABLET | Freq: Every day | ORAL | Status: DC
  Administered 2019-09-25 – 2019-09-26 (×2): 50 mg via ORAL
  Filled 2019-09-25 (×3): qty 1

## 2019-09-25 MED ORDER — LACTATED RINGERS IV SOLN: INTRAVENOUS | Status: DC

## 2019-09-25 MED ORDER — ONDANSETRON HCL 4 MG/2ML IJ SOLN: 4.0000 mg | Freq: Once | INTRAMUSCULAR | Status: DC | PRN

## 2019-09-25 MED ORDER — OXYCODONE HCL 5 MG PO TABS
5.0000 mg | ORAL_TABLET | ORAL | Status: DC | PRN
  Administered 2019-09-25 – 2019-09-27 (×2): 5 mg via ORAL
  Filled 2019-09-25 (×2): qty 1

## 2019-09-25 MED ORDER — CITALOPRAM HYDROBROMIDE 20 MG PO TABS
20.0000 mg | ORAL_TABLET | Freq: Every day | ORAL | Status: DC
  Administered 2019-09-26: 20 mg via ORAL
  Filled 2019-09-25: qty 1

## 2019-09-25 MED ORDER — LOSARTAN POTASSIUM-HCTZ 100-25 MG PO TABS: 1.0000 | ORAL_TABLET | Freq: Every day | ORAL | Status: DC

## 2019-09-25 MED ORDER — FENTANYL CITRATE (PF) 250 MCG/5ML IJ SOLN
INTRAMUSCULAR | Status: AC
  Filled 2019-09-25: qty 5

## 2019-09-25 MED ORDER — GABAPENTIN 300 MG PO CAPS
300.0000 mg | ORAL_CAPSULE | Freq: Three times a day (TID) | ORAL | Status: DC
  Administered 2019-09-25 – 2019-09-26 (×4): 300 mg via ORAL
  Filled 2019-09-25 (×4): qty 1

## 2019-09-25 MED ORDER — ZOLPIDEM TARTRATE 5 MG PO TABS: 5.0000 mg | ORAL_TABLET | Freq: Every evening | ORAL | Status: DC | PRN

## 2019-09-25 MED ORDER — ACETAMINOPHEN 325 MG PO TABS
650.0000 mg | ORAL_TABLET | ORAL | Status: DC | PRN
  Administered 2019-09-25 – 2019-09-26 (×2): 650 mg via ORAL
  Filled 2019-09-25 (×2): qty 2

## 2019-09-25 MED ORDER — SODIUM CHLORIDE 0.9% FLUSH: 3.0000 mL | INTRAVENOUS | Status: DC | PRN

## 2019-09-25 MED ORDER — FENTANYL CITRATE (PF) 100 MCG/2ML IJ SOLN
INTRAMUSCULAR | Status: AC
  Filled 2019-09-25: qty 2

## 2019-09-25 MED ORDER — ROCURONIUM BROMIDE 100 MG/10ML IV SOLN
INTRAVENOUS | Status: DC | PRN
  Administered 2019-09-25: 30 mg via INTRAVENOUS
  Administered 2019-09-25: 20 mg via INTRAVENOUS
  Administered 2019-09-25: 70 mg via INTRAVENOUS
  Administered 2019-09-25: 50 mg via INTRAVENOUS
  Administered 2019-09-25: 30 mg via INTRAVENOUS

## 2019-09-25 MED ORDER — BUPIVACAINE HCL (PF) 0.5 % IJ SOLN
INTRAMUSCULAR | Status: DC | PRN
  Administered 2019-09-25: 30 mL

## 2019-09-25 MED ORDER — BISACODYL 5 MG PO TBEC: 5.0000 mg | DELAYED_RELEASE_TABLET | Freq: Every day | ORAL | Status: DC | PRN

## 2019-09-25 MED ORDER — ASPIRIN EC 81 MG PO TBEC
81.0000 mg | DELAYED_RELEASE_TABLET | Freq: Every day | ORAL | Status: DC
  Administered 2019-09-26: 81 mg via ORAL
  Filled 2019-09-25: qty 1

## 2019-09-25 MED ORDER — MIDAZOLAM HCL 5 MG/5ML IJ SOLN
INTRAMUSCULAR | Status: DC | PRN
  Administered 2019-09-25: 2 mg via INTRAVENOUS

## 2019-09-25 MED ORDER — FENTANYL CITRATE (PF) 100 MCG/2ML IJ SOLN
INTRAMUSCULAR | Status: DC | PRN
Start: 2019-09-25 — End: 2019-09-25
  Administered 2019-09-25: 50 ug via INTRAVENOUS
  Administered 2019-09-25: 100 ug via INTRAVENOUS
  Administered 2019-09-25 (×3): 50 ug via INTRAVENOUS
  Administered 2019-09-25: 100 ug via INTRAVENOUS
  Administered 2019-09-25 (×2): 50 ug via INTRAVENOUS
  Administered 2019-09-25: 100 ug via INTRAVENOUS
  Administered 2019-09-25 (×3): 50 ug via INTRAVENOUS

## 2019-09-25 MED ORDER — AMLODIPINE BESYLATE 5 MG PO TABS
5.0000 mg | ORAL_TABLET | Freq: Every day | ORAL | Status: DC
  Administered 2019-09-26: 5 mg via ORAL
  Filled 2019-09-25: qty 1

## 2019-09-25 MED ORDER — OXYCODONE HCL 5 MG PO TABS
10.0000 mg | ORAL_TABLET | ORAL | Status: DC | PRN
  Administered 2019-09-26 – 2019-09-27 (×6): 10 mg via ORAL
  Filled 2019-09-25 (×6): qty 2

## 2019-09-25 MED ORDER — BUPIVACAINE HCL (PF) 0.5 % IJ SOLN
INTRAMUSCULAR | Status: AC
  Filled 2019-09-25: qty 30

## 2019-09-25 MED ORDER — CEFAZOLIN SODIUM-DEXTROSE 2-4 GM/100ML-% IV SOLN
2.0000 g | INTRAVENOUS | Status: AC
  Administered 2019-09-25 (×2): 2 g via INTRAVENOUS

## 2019-09-25 MED ORDER — LACTATED RINGERS IV SOLN: INTRAVENOUS | Status: DC | PRN

## 2019-09-25 MED ORDER — MIDAZOLAM HCL 2 MG/2ML IJ SOLN
INTRAMUSCULAR | Status: AC
  Filled 2019-09-25: qty 2

## 2019-09-25 MED ORDER — KETAMINE HCL 10 MG/ML IJ SOLN
INTRAMUSCULAR | Status: DC | PRN
  Administered 2019-09-25: 30 mg via INTRAVENOUS
  Administered 2019-09-25: 20 mg via INTRAVENOUS

## 2019-09-25 MED ORDER — LORATADINE 10 MG PO TABS
10.0000 mg | ORAL_TABLET | Freq: Every day | ORAL | Status: DC
  Administered 2019-09-26: 10 mg via ORAL
  Filled 2019-09-25: qty 1

## 2019-09-25 MED ORDER — FLUTICASONE PROPIONATE 50 MCG/ACT NA SUSP
1.0000 | Freq: Every day | NASAL | Status: DC | PRN
  Filled 2019-09-25: qty 16

## 2019-09-25 MED ORDER — ONDANSETRON HCL 4 MG/2ML IJ SOLN
INTRAMUSCULAR | Status: DC | PRN
  Administered 2019-09-25: 4 mg via INTRAVENOUS

## 2019-09-25 MED ORDER — DEXAMETHASONE SODIUM PHOSPHATE 10 MG/ML IJ SOLN
INTRAMUSCULAR | Status: DC | PRN
  Administered 2019-09-25: 10 mg via INTRAVENOUS

## 2019-09-25 MED ORDER — LIDOCAINE-EPINEPHRINE 0.5 %-1:200000 IJ SOLN
INTRAMUSCULAR | Status: DC | PRN
  Administered 2019-09-25: 7 mL

## 2019-09-25 MED ORDER — ACETAMINOPHEN 650 MG RE SUPP: 650.0000 mg | RECTAL | Status: DC | PRN

## 2019-09-25 MED ORDER — EPHEDRINE SULFATE 50 MG/ML IJ SOLN
INTRAMUSCULAR | Status: DC | PRN
  Administered 2019-09-25 (×2): 5 mg via INTRAVENOUS

## 2019-09-25 MED ORDER — HYDROCHLOROTHIAZIDE 25 MG PO TABS
25.0000 mg | ORAL_TABLET | Freq: Every day | ORAL | Status: DC
  Administered 2019-09-26: 25 mg via ORAL
  Filled 2019-09-25: qty 1

## 2019-09-25 MED ORDER — LOSARTAN POTASSIUM 50 MG PO TABS
100.0000 mg | ORAL_TABLET | Freq: Every day | ORAL | Status: DC
  Administered 2019-09-26: 100 mg via ORAL
  Filled 2019-09-25: qty 2

## 2019-09-25 MED ORDER — POTASSIUM CHLORIDE 10 MEQ/100ML IV SOLN
10.0000 meq | INTRAVENOUS | Status: AC
  Administered 2019-09-25 (×4): 10 meq via INTRAVENOUS
  Filled 2019-09-25 (×4): qty 100

## 2019-09-25 MED ORDER — ONDANSETRON HCL 4 MG PO TABS: 4.0000 mg | ORAL_TABLET | Freq: Four times a day (QID) | ORAL | Status: DC | PRN

## 2019-09-25 MED ORDER — SODIUM CHLORIDE 0.9% FLUSH
3.0000 mL | Freq: Two times a day (BID) | INTRAVENOUS | Status: DC
  Administered 2019-09-26: 3 mL via INTRAVENOUS

## 2019-09-25 MED ORDER — CHLORHEXIDINE GLUCONATE CLOTH 2 % EX PADS: 6.0000 | MEDICATED_PAD | Freq: Once | CUTANEOUS | Status: DC

## 2019-09-25 MED ORDER — ORAL CARE MOUTH RINSE: 15.0000 mL | Freq: Once | OROMUCOSAL | Status: AC

## 2019-09-25 MED ORDER — OXYCODONE HCL 5 MG PO TABS: 5.0000 mg | ORAL_TABLET | Freq: Once | ORAL | Status: DC | PRN

## 2019-09-25 MED ORDER — OXYCODONE HCL 5 MG/5ML PO SOLN: 5.0000 mg | Freq: Once | ORAL | Status: DC | PRN

## 2019-09-25 MED ORDER — THROMBIN 20000 UNITS EX SOLR
CUTANEOUS | Status: AC
  Filled 2019-09-25: qty 20000

## 2019-09-25 MED ORDER — PHENYLEPHRINE HCL-NACL 10-0.9 MG/250ML-% IV SOLN
INTRAVENOUS | Status: DC | PRN
  Administered 2019-09-25: 25 ug/min via INTRAVENOUS

## 2019-09-25 MED ORDER — 0.9 % SODIUM CHLORIDE (POUR BTL) OPTIME
TOPICAL | Status: DC | PRN
  Administered 2019-09-25: 1000 mL

## 2019-09-25 MED ORDER — LIDOCAINE HCL (CARDIAC) PF 100 MG/5ML IV SOSY
PREFILLED_SYRINGE | INTRAVENOUS | Status: DC | PRN
  Administered 2019-09-25: 60 mg via INTRAVENOUS

## 2019-09-25 MED ORDER — DOCUSATE SODIUM 100 MG PO CAPS
100.0000 mg | ORAL_CAPSULE | Freq: Two times a day (BID) | ORAL | Status: DC
  Administered 2019-09-25 – 2019-09-26 (×3): 100 mg via ORAL
  Filled 2019-09-25 (×3): qty 1

## 2019-09-25 MED ORDER — HYDROMORPHONE HCL 1 MG/ML IJ SOLN: 1.0000 mg | INTRAMUSCULAR | Status: DC | PRN

## 2019-09-25 MED ORDER — DIAZEPAM 5 MG PO TABS
ORAL_TABLET | ORAL | Status: AC
  Filled 2019-09-25: qty 1

## 2019-09-25 MED ORDER — LIDOCAINE-EPINEPHRINE 0.5 %-1:200000 IJ SOLN
INTRAMUSCULAR | Status: AC
  Filled 2019-09-25: qty 1

## 2019-09-25 MED ORDER — SENNOSIDES-DOCUSATE SODIUM 8.6-50 MG PO TABS: 1.0000 | ORAL_TABLET | Freq: Every evening | ORAL | Status: DC | PRN

## 2019-09-25 MED ORDER — ALUM & MAG HYDROXIDE-SIMETH 200-200-20 MG/5ML PO SUSP: 30.0000 mL | Freq: Four times a day (QID) | ORAL | Status: DC | PRN

## 2019-09-25 MED ORDER — KETAMINE HCL 50 MG/5ML IJ SOSY
PREFILLED_SYRINGE | INTRAMUSCULAR | Status: AC
  Filled 2019-09-25: qty 5

## 2019-09-25 MED ORDER — PHENOL 1.4 % MT LIQD: 1.0000 | OROMUCOSAL | Status: DC | PRN

## 2019-09-25 MED ORDER — FENTANYL CITRATE (PF) 100 MCG/2ML IJ SOLN
25.0000 ug | INTRAMUSCULAR | Status: DC | PRN
  Administered 2019-09-25 (×4): 25 ug via INTRAVENOUS

## 2019-09-25 MED ORDER — MENTHOL 3 MG MT LOZG: 1.0000 | LOZENGE | OROMUCOSAL | Status: DC | PRN

## 2019-09-25 MED ORDER — DEXMEDETOMIDINE (PRECEDEX) IN NS 20 MCG/5ML (4 MCG/ML) IV SYRINGE
PREFILLED_SYRINGE | INTRAVENOUS | Status: DC | PRN
  Administered 2019-09-25 (×2): 8 ug via INTRAVENOUS
  Administered 2019-09-25: 12 ug via INTRAVENOUS

## 2019-09-25 MED ORDER — DEXMEDETOMIDINE (PRECEDEX) IN NS 20 MCG/5ML (4 MCG/ML) IV SYRINGE
PREFILLED_SYRINGE | INTRAVENOUS | Status: AC
  Filled 2019-09-25: qty 10

## 2019-09-25 MED ORDER — POTASSIUM CHLORIDE IN NACL 20-0.9 MEQ/L-% IV SOLN: INTRAVENOUS | Status: DC

## 2019-09-25 MED ORDER — MAGNESIUM CITRATE PO SOLN
1.0000 | Freq: Once | ORAL | Status: AC | PRN
  Administered 2019-09-26: 1 via ORAL
  Filled 2019-09-25: qty 296

## 2019-09-25 MED ORDER — PANTOPRAZOLE SODIUM 40 MG PO TBEC
40.0000 mg | DELAYED_RELEASE_TABLET | Freq: Every day | ORAL | Status: DC
  Administered 2019-09-26: 40 mg via ORAL
  Filled 2019-09-25: qty 1

## 2019-09-25 MED ORDER — KETOROLAC TROMETHAMINE 15 MG/ML IJ SOLN
7.5000 mg | Freq: Four times a day (QID) | INTRAMUSCULAR | Status: AC
  Administered 2019-09-26 (×3): 7.5 mg via INTRAVENOUS
  Filled 2019-09-25 (×4): qty 1

## 2019-09-25 MED ORDER — THROMBIN 20000 UNITS EX SOLR: CUTANEOUS | Status: DC | PRN

## 2019-09-25 MED ORDER — ONDANSETRON HCL 4 MG/2ML IJ SOLN: 4.0000 mg | Freq: Four times a day (QID) | INTRAMUSCULAR | Status: DC | PRN

## 2019-09-25 MED ORDER — PROPOFOL 10 MG/ML IV BOLUS
INTRAVENOUS | Status: DC | PRN
  Administered 2019-09-25: 100 mg via INTRAVENOUS
  Administered 2019-09-25: 50 mg via INTRAVENOUS

## 2019-09-25 MED ORDER — MONTELUKAST SODIUM 10 MG PO TABS
10.0000 mg | ORAL_TABLET | Freq: Every day | ORAL | Status: DC
  Administered 2019-09-25 – 2019-09-26 (×2): 10 mg via ORAL
  Filled 2019-09-25 (×3): qty 1

## 2019-09-25 MED ORDER — CEFAZOLIN SODIUM 1 G IJ SOLR
INTRAMUSCULAR | Status: AC
  Filled 2019-09-25: qty 20

## 2019-09-25 MED ORDER — KETOROLAC TROMETHAMINE 15 MG/ML IJ SOLN
INTRAMUSCULAR | Status: AC
  Administered 2019-09-25: 7.5 mg
  Filled 2019-09-25: qty 1

## 2019-09-25 MED ORDER — OXYCODONE HCL ER 15 MG PO T12A
15.0000 mg | EXTENDED_RELEASE_TABLET | Freq: Two times a day (BID) | ORAL | Status: DC
  Administered 2019-09-25 – 2019-09-26 (×3): 15 mg via ORAL
  Filled 2019-09-25 (×3): qty 1

## 2019-09-25 MED ORDER — SODIUM CHLORIDE 0.9 % IV SOLN: 250.0000 mL | INTRAVENOUS | Status: DC

## 2019-09-25 SURGICAL SUPPLY — 66 items
BAG DECANTER FOR FLEXI CONT (MISCELLANEOUS) ×3 IMPLANT
BASKET BONE COLLECTION (BASKET) ×3 IMPLANT
BIT DRILL PLIF MAS DISP 5.5MM (DRILL) ×1 IMPLANT
BUR MATCHSTICK NEURO 3.0 LAGG (BURR) ×3 IMPLANT
BUR PRECISION FLUTE 5.0 (BURR) ×3 IMPLANT
CANISTER SUCT 3000ML PPV (MISCELLANEOUS) ×3 IMPLANT
CAP RELINE MOD TULIP RMM (Cap) ×18 IMPLANT
CARTRIDGE OIL MAESTRO DRILL (MISCELLANEOUS) ×1 IMPLANT
CASCADIA AN CONVEX 8.5X28X11 (Cage) ×12 IMPLANT
CLOSURE WOUND 1/2 X4 (GAUZE/BANDAGES/DRESSINGS)
CNTNR URN SCR LID CUP LEK RST (MISCELLANEOUS) ×1 IMPLANT
CONT SPEC 4OZ STRL OR WHT (MISCELLANEOUS) ×2
DECANTER SPIKE VIAL GLASS SM (MISCELLANEOUS) ×3 IMPLANT
DERMABOND ADVANCED (GAUZE/BANDAGES/DRESSINGS) ×2
DERMABOND ADVANCED .7 DNX12 (GAUZE/BANDAGES/DRESSINGS) ×1 IMPLANT
DIFFUSER DRILL AIR PNEUMATIC (MISCELLANEOUS) ×3 IMPLANT
DRAPE C-ARM 42X72 X-RAY (DRAPES) ×3 IMPLANT
DRAPE C-ARMOR (DRAPES) ×3 IMPLANT
DRAPE LAPAROTOMY 100X72X124 (DRAPES) ×3 IMPLANT
DRAPE SURG 17X23 STRL (DRAPES) ×3 IMPLANT
DRILL PLIF MAS DISP 5.5MM (DRILL) ×3
DRSG OPSITE POSTOP 4X10 (GAUZE/BANDAGES/DRESSINGS) ×3 IMPLANT
DRSG TELFA 3X8 NADH (GAUZE/BANDAGES/DRESSINGS) ×3 IMPLANT
DURAPREP 26ML APPLICATOR (WOUND CARE) ×3 IMPLANT
ELECT REM PT RETURN 9FT ADLT (ELECTROSURGICAL) ×3
ELECTRODE REM PT RTRN 9FT ADLT (ELECTROSURGICAL) ×1 IMPLANT
GAUZE 4X4 16PLY RFD (DISPOSABLE) IMPLANT
GAUZE SPONGE 4X4 12PLY STRL (GAUZE/BANDAGES/DRESSINGS) IMPLANT
GLOVE BIO SURGEON STRL SZ 6.5 (GLOVE) ×8 IMPLANT
GLOVE BIO SURGEONS STRL SZ 6.5 (GLOVE) ×4
GLOVE BIOGEL PI IND STRL 6.5 (GLOVE) ×2 IMPLANT
GLOVE BIOGEL PI INDICATOR 6.5 (GLOVE) ×4
GLOVE ECLIPSE 6.5 STRL STRAW (GLOVE) ×3 IMPLANT
GLOVE ECLIPSE 7.5 STRL STRAW (GLOVE) ×6 IMPLANT
GLOVE EXAM NITRILE XL STR (GLOVE) IMPLANT
GOWN STRL REUS W/ TWL LRG LVL3 (GOWN DISPOSABLE) ×4 IMPLANT
GOWN STRL REUS W/ TWL XL LVL3 (GOWN DISPOSABLE) IMPLANT
GOWN STRL REUS W/TWL 2XL LVL3 (GOWN DISPOSABLE) IMPLANT
GOWN STRL REUS W/TWL LRG LVL3 (GOWN DISPOSABLE) ×8
GOWN STRL REUS W/TWL XL LVL3 (GOWN DISPOSABLE)
GRAFT DURAGEN MATRIX 1WX1L (Tissue) ×3 IMPLANT
INTERBODY CSCD AN CVX8.5X28X11 (Cage) ×4 IMPLANT
KIT BASIN OR (CUSTOM PROCEDURE TRAY) ×3 IMPLANT
KIT POSITION SURG JACKSON T1 (MISCELLANEOUS) ×3 IMPLANT
KIT TURNOVER KIT B (KITS) ×3 IMPLANT
MILL MEDIUM DISP (BLADE) ×3 IMPLANT
NEEDLE HYPO 25X1 1.5 SAFETY (NEEDLE) ×3 IMPLANT
NEEDLE SPNL 18GX3.5 QUINCKE PK (NEEDLE) ×3 IMPLANT
NS IRRIG 1000ML POUR BTL (IV SOLUTION) ×3 IMPLANT
OIL CARTRIDGE MAESTRO DRILL (MISCELLANEOUS) ×3
PACK LAMINECTOMY NEURO (CUSTOM PROCEDURE TRAY) ×3 IMPLANT
PATTIES SURGICAL .5 X.5 (GAUZE/BANDAGES/DRESSINGS) ×3 IMPLANT
ROD RELINE COCR 5.0X60MM (Rod) ×6 IMPLANT
SCREW LOCK RSS 4.5/5.0MM (Screw) ×18 IMPLANT
SCREW SHANK RELINE MOD 5.5X35 (Screw) ×12 IMPLANT
SHANK RELINE MOD 5.5X40 (Screw) ×6 IMPLANT
SPONGE SURGIFOAM ABS GEL 100 (HEMOSTASIS) ×3 IMPLANT
STRIP CLOSURE SKIN 1/2X4 (GAUZE/BANDAGES/DRESSINGS) IMPLANT
SUT PROLENE 6 0 BV (SUTURE) IMPLANT
SUT VIC AB 0 CT1 18XCR BRD8 (SUTURE) ×1 IMPLANT
SUT VIC AB 0 CT1 8-18 (SUTURE) ×2
SUT VIC AB 2-0 CT1 18 (SUTURE) ×3 IMPLANT
SUT VIC AB 3-0 SH 8-18 (SUTURE) ×3 IMPLANT
TOWEL GREEN STERILE (TOWEL DISPOSABLE) ×3 IMPLANT
TOWEL GREEN STERILE FF (TOWEL DISPOSABLE) ×3 IMPLANT
WATER STERILE IRR 1000ML POUR (IV SOLUTION) ×3 IMPLANT

## 2019-09-25 NOTE — Op Note (Signed)
09/25/2019  8:20 PM  PATIENT:  Lee Leblanc  74 y.o. male  PRE-OPERATIVE DIAGNOSIS:  Spondylolisthesis, Lumbar regionL4/5, Lumbar stenosis, lateral recess stenosis L3/4 DDD L3/4,4/5  POST-OPERATIVE DIAGNOSIS:  same  PROCEDURE:  Procedure(s): Lumbar 3/4, 4/5 plif with titanium cascadia(Stryker) cages 11x35mm filled with autograft morsels Laminectomy beyond the needed exposure for a plif at L3 and L4 Segmental pedicle screw fixation relign screws(nuvasive) L3-L5   SURGEON: Surgeon(s): Ashok Pall, MD  ASSISTANTS:none  ANESTHESIA:   general  EBL:  Total I/O In: 900 [I.V.:900] Out: 270 [Urine:220; Blood:50]  BLOOD ADMINISTERED:225 CC CELLSAVER  CELL SAVER GIVEN:yes  COUNT:per nursing  DRAINS: none   SPECIMEN:  No Specimen  DICTATION: TEX CONROY was taken to the operating room, intubated, and placed under a general anesthetic without difficulty. He was positioned prone on a jackson table with all pressure points properly padded. A foley catheter was placed under sterile conditions prior to positioning. His lumbar region and old incision was prepped and draped in a sterile manner. I infiltrated 20 cc lidocaine in the planned incision. I opened the skin with a 10 blade and dissected sharply through the thoracolumbar fascia. I used the monopolar cautery to identify bone laterally. This was a redo laminectomy at both levels. The facets were markedly arthropathic and hypertrophied especially at L4/5.  I started the decompression via complete laminectomies and inferior facetectomies of L3 and L4. I used the Kerrison punches, and the drill to remove the bulk of the bone. I used disscectors and curettes to remove soft tissue from the bony edges. There was severe stenosis at L3/4 and L4/5 due to scar and the hypertrophied facets. I drilled bone so that I could identify the L3,4,ad 5 pedicles. I opened the disc spaces widely aided by the facetectomies at L3/4, and 4/5. Partial  superior facetectomies were performed at L4, and L5.  The spinal canal and lateral recesses were well decompressed after removing the bone and scar tissue. This was worse on the right side, the worst at L3/4.  Once the decompression was done, the plifs were performed in the same manner. I opened the disc spaces and used various instruments to remove the disc in the disc space. I placed 87mm x57mm cages at L3/4, and 4/5 on both sides. The disc spaces and cages were packed with autograft morsels taken from the decompression. The cages were placed with fluoroscopic aid.  I then turned my attention to placing the pedicle screws.  With fluoroscopic guidance I placed six pedicle screws, two at each level L3.4.and L5. I first drilled a pilot hole, then cannulated the pilot with either a drill then tap, or a tap only. I placed 5.20mm relign screws(nuvasive)at each level. Final films showed the screws to be in correct position. I placed the caps on the screws. I connected the screws with a curved rod on each side. I secured the rods with locking caps.  I irrigated the wound. I placed a piece of duragen over exposed arachnoid which was intact.  I closed the wound by approximating the thoracolumbar fascia, subcutaneous, and subcuticular planes with vicryl sutures.  I placed dermabond, and an occlusive bandage on the wound.  He was extubated after being placed on the OR stretcher supine.   PLAN OF CARE: Admit to inpatient   PATIENT DISPOSITION:  PACU - hemodynamically stable.   Delay start of Pharmacological VTE agent (>24hrs) due to surgical blood loss or risk of bleeding:  yes

## 2019-09-25 NOTE — H&P (Signed)
BP (!) 146/89   Pulse 71   Temp 98.1 F (36.7 C) (Oral)   Resp 18   Ht 6\' 2"  (1.88 m)   Wt 100.2 kg   SpO2 98%   BMI 28.37 kg/m  Lee Leblanc is a former patient of Dr. Sherwood Gambler who has been seen regularly.  His last operation was at L3-4 on the right side for pain that he is having in the right lower extremity.  He presents today with a history consistent with neurogenic claudication secondary to lumbar stenosis.  MRI that was done in November of 2020 shows fairly significant stenosis and severe foraminal narrowing at L3-4 and L4-5.  He has a retrolisthesis at both of those levels also.  He received an injection at L4-5 done by Dr. Sherwood Gambler in December, but that did not do much of anything to relieve his pain.  He comes in today just because he and his wife both say he is good for nothing at this point, almost.  He just sits.  Too much pain is involved with movement and it is just not something that he should have at the end of each day, but the idea of surgery which we did discuss, is something that is just a bit much for him.  He is alert, oriented by 4, answering all questions appropriately.  Memory, language, attention span, and fund of knowledge are normal.  Full strength in the lower extremities.  He is 6 feet 1 inch, weighs 225 pounds.  Temperature is 97.5, blood pressure is 135/75, pulse is 80, pain is 5/10.  Pain is bilateral lower extremity, left slightly greater than right.  Hurts the longer he stands.  Hurts the longer he walks. Lee Leblanc comes back so we can review his MRI today.  It shows severe facet arthropathy at L3-4 where he is listhesed anteriorly and at 4-5 where he is listhesed anteriorly.  Significant degeneration in the disc space at L4-5.  Severe stenosis at 3-4 and at 4-5.  He needs an aggressive decompression for the stenosis and subsequent arthrodesis at 3-4 and 4-5.  We will do this next week Thursday.  Risks and benefits, bleeding, infection, no relief, need for further  surgery, fusion failure, hardware failure, damage to the nerve roots, bowel, bladder dysfunction and other risks were discussed.  He understands and wishes to proceed.  No Known Allergies Past Medical History:  Diagnosis Date  . COPD (chronic obstructive pulmonary disease) (HCC)    has not used albuterol inhaler for last 2 months  . Depression   . GERD (gastroesophageal reflux disease)   . Hypertension    Past Surgical History:  Procedure Laterality Date  . BACK SURGERY    lasdt 1990's   lower x 7  . COLONOSCOPY WITH PROPOFOL N/A 06/25/2012   Procedure: COLONOSCOPY WITH PROPOFOL;  Surgeon: Garlan Fair, MD;  Location: [facility];  Service: Endoscopy;  Laterality: N/A;  . ESOPHAGOGASTRODUODENOSCOPY (EGD) WITH PROPOFOL N/A 06/25/2012   Procedure: ESOPHAGOGASTRODUODENOSCOPY (EGD) WITH PROPOFOL;  Surgeon: Garlan Fair, MD;  Location: [facility];  Service: Endoscopy;  Laterality: N/A;  . EYE SURGERY Right   . SHOULDER ARTHROSCOPY Right    Family History  Problem Relation Age of Onset  . Allergies Daughter   . Allergies Son   . Leukemia Sister   . Prostate cancer Father    Social History   Socioeconomic History  . Marital status: Married    Spouse name: Not on file  . Number of  children: Not on file  . Years of education: Not on file  . Highest education level: Not on file  Occupational History  . Occupation: Retired  Tobacco Use  . Smoking status: Former Smoker    Packs/day: 0.50    Years: 50.00    Pack years: 25.00    Types: Cigarettes    Quit date: 02/07/2011    Years since quitting: 8.6  . Smokeless tobacco: Never Used  Substance and Sexual Activity  . Alcohol use: Yes    Alcohol/week: 0.0 standard drinks    Comment: occasional  . Drug use: No  . Sexual activity: Not on file  Other Topics Concern  . Not on file  Social History Narrative  . Not on file   Social Determinants of Health   Financial Resource Strain:   . Difficulty of Paying Living  Expenses: Not on file  Food Insecurity:   . Worried About Charity fundraiser in the Last Year: Not on file  . Ran Out of Food in the Last Year: Not on file  Transportation Needs:   . Lack of Transportation (Medical): Not on file  . Lack of Transportation (Non-Medical): Not on file  Physical Activity:   . Days of Exercise per Week: Not on file  . Minutes of Exercise per Session: Not on file  Stress:   . Feeling of Stress : Not on file  Social Connections:   . Frequency of Communication with Friends and Family: Not on file  . Frequency of Social Gatherings with Friends and Family: Not on file  . Attends Religious Services: Not on file  . Active Member of Clubs or Organizations: Not on file  . Attends Archivist Meetings: Not on file  . Marital Status: Not on file  Intimate Partner Violence:   . Fear of Current or Ex-Partner: Not on file  . Emotionally Abused: Not on file  . Physically Abused: Not on file  . Sexually Abused: Not on file   Prior to Admission medications   Medication Sig Start Date End Date Taking? Authorizing Provider  amLODipine (NORVASC) 5 MG tablet Take 5 mg by mouth daily.   Yes [provider]  cetirizine (ZYRTEC) 10 MG tablet Take 10 mg by mouth daily.   Yes [provider]  citalopram (CELEXA) 20 MG tablet Take 20 mg by mouth daily.   Yes [provider]  fluticasone (FLONASE) 50 MCG/ACT nasal spray Place 1 spray into both nostrils daily.   Yes [provider]  losartan-hydrochlorothiazide (HYZAAR) 100-25 MG per tablet Take 1 tablet by mouth daily.    Yes [provider]  montelukast (SINGULAIR) 10 MG tablet Take 10 mg by mouth at bedtime.   Yes [provider]  pantoprazole (PROTONIX) 40 MG tablet Take 40 mg by mouth daily. PRN   Yes [provider]  traZODone (DESYREL) 50 MG tablet Take 50 mg by mouth at bedtime. 07/20/19  Yes [provider]  aspirin 81 MG tablet Take 81 mg by  mouth daily.    [provider]  citalopram (CELEXA) 40 MG tablet Take 0.5 tablets (20 mg total) by mouth daily. Patient not taking: Reported on 09/12/2019 05/27/14   Louellen Molder, MD

## 2019-09-25 NOTE — Anesthesia Preprocedure Evaluation (Addendum)
Anesthesia Evaluation  Patient identified by MRN, date of birth, ID band Patient awake    Reviewed: Allergy & Precautions, NPO status , Patient's Chart, lab work & pertinent test results  History of Anesthesia Complications Negative for: history of anesthetic complications  Airway Mallampati: II  TM Distance: >3 FB Neck ROM: Full    Dental  (+) Partial Lower, Upper Dentures   Pulmonary COPD,  COPD inhaler, former smoker,    Pulmonary exam normal        Cardiovascular hypertension, Pt. on medications Normal cardiovascular exam   '21 CT Chest - Stable mild aneurysmal dilatation of the ascending thoracic aorta measuring approximately 4.1 cm in AP diameter. Aortic root measures 3.2 cm as the sinotubular junction measures 2.9 cm. Mid aortic arch measures 2.9 cm and proximal descending thoracic aorta is not significantly changed measuring 3.6 cm in transverse diameter. Distal descending thoracic aorta measures 2.9 cm. Remaining vascular structures are unremarkable.    Neuro/Psych PSYCHIATRIC DISORDERS Depression  Neuromuscular disease (Lumbar radiculopathy)    GI/Hepatic Neg liver ROS, GERD  Medicated and Controlled,  Endo/Other   Hypokalemia   Renal/GU negative Renal ROS     Musculoskeletal negative musculoskeletal ROS (+)   Abdominal   Peds  Hematology negative hematology ROS (+)   Anesthesia Other Findings Covid test negative    Reproductive/Obstetrics                           Anesthesia Physical Anesthesia Plan  ASA: III  Anesthesia Plan: General   Post-op Pain Management:    Induction: Intravenous  PONV Risk Score and Plan: 3 and Treatment may vary due to age or medical condition, Ondansetron and Dexamethasone  Airway Management Planned: Oral ETT  Additional Equipment: None  Intra-op Plan:   Post-operative Plan: Extubation in OR  Informed Consent: I have reviewed the  patients History and Physical, chart, labs and discussed the procedure including the risks, benefits and alternatives for the proposed anesthesia with the patient or authorized representative who has indicated his/her understanding and acceptance.     Dental advisory given  Plan Discussed with: CRNA and Anesthesiologist  Anesthesia Plan Comments:        Anesthesia Quick Evaluation

## 2019-09-25 NOTE — Anesthesia Procedure Notes (Signed)
Arterial Line Insertion Start/End8/19/2021 12:35 PM, 09/25/2019 12:44 PM Performed by: Caysie Minnifield T, Immunologist, CRNA  Patient location: Pre-op. Preanesthetic checklist: patient identified, IV checked, site marked, risks and benefits discussed, surgical consent, monitors and equipment checked, pre-op evaluation and anesthesia consent Lidocaine 1% used for infiltration Right, radial was placed Catheter size: 20 G Hand hygiene performed  and maximum sterile barriers used   Attempts: 1 Procedure performed without using ultrasound guided technique. Following insertion, dressing applied and Biopatch. Post procedure assessment: normal  Patient tolerated the procedure well with no immediate complications.

## 2019-09-25 NOTE — Anesthesia Procedure Notes (Signed)
Procedure Name: Intubation Date/Time: 09/25/2019 1:14 PM Performed by: Zelie Asbill T, CRNA Pre-anesthesia Checklist: Patient identified, Emergency Drugs available, Suction available and Patient being monitored Patient Re-evaluated:Patient Re-evaluated prior to induction Oxygen Delivery Method: Circle system utilized Preoxygenation: Pre-oxygenation with 100% oxygen Induction Type: IV induction Ventilation: Mask ventilation without difficulty and Oral airway inserted - appropriate to patient size Laryngoscope Size: Miller and 3 Grade View: Grade I Tube type: Oral Tube size: 8.0 mm Number of attempts: 1 Airway Equipment and Method: Stylet and Oral airway Placement Confirmation: ETT inserted through vocal cords under direct vision,  positive ETCO2 and breath sounds checked- equal and bilateral Secured at: 22 cm Tube secured with: Tape Dental Injury: Teeth and Oropharynx as per pre-operative assessment

## 2019-09-25 NOTE — Transfer of Care (Signed)
Immediate Anesthesia Transfer of Care Note  Patient: Lee Leblanc  Procedure(s) Performed: Lumbar three-four Lumbar four-five Posterior lumbar interbody fusion (N/A Spine Lumbar)  Patient Location: PACU  Anesthesia Type:General  Level of Consciousness: awake, alert  and oriented  Airway & Oxygen Therapy: Patient Spontanous Breathing and Patient connected to nasal cannula oxygen  Post-op Assessment: Report given to RN and Post -op Vital signs reviewed and stable  Post vital signs: Reviewed and stable  Last Vitals:  Vitals Value Taken Time  BP 122/70 09/25/19 2007  Temp    Pulse 56 09/25/19 2010  Resp    SpO2 96 % 09/25/19 2010  Vitals shown include unvalidated device data.  Last Pain:  Vitals:   09/25/19 0936  TempSrc: Oral         Complications: No complications documented.

## 2019-09-25 NOTE — Progress Notes (Signed)
Orthopedic Tech Progress Note Patient Details:  DELWIN RACZKOWSKI 09-Mar-1945 [id number] LSO Brace has been ordered  Patient ID: Enedina Finner, male   DOB: [birth year], 74 y.o.   MRN: [id number]   Jearld Lesch 09/25/2019, 10:08 PM

## 2019-09-26 LAB — POCT I-STAT, CHEM 8
BUN: 9 mg/dL (ref 8–23)
Calcium, Ion: 1.21 mmol/L (ref 1.15–1.40)
Chloride: 102 mmol/L (ref 98–111)
Creatinine, Ser: 0.8 mg/dL (ref 0.61–1.24)
Glucose, Bld: 136 mg/dL — ABNORMAL HIGH (ref 70–99)
HCT: 38 % — ABNORMAL LOW (ref 39.0–52.0)
Hemoglobin: 12.9 g/dL — ABNORMAL LOW (ref 13.0–17.0)
Potassium: 3.7 mmol/L (ref 3.5–5.1)
Sodium: 140 mmol/L (ref 135–145)
TCO2: 24 mmol/L (ref 22–32)

## 2019-09-26 MED ORDER — MAGNESIUM CITRATE PO SOLN
1.0000 | Freq: Once | ORAL | Status: AC | PRN
  Administered 2019-09-26: 1 via ORAL
  Filled 2019-09-26: qty 296

## 2019-09-26 MED ORDER — LIDOCAINE HCL (PF) 1 % IJ SOLN
INTRAMUSCULAR | Status: AC
  Administered 2019-09-26: 1 mL
  Filled 2019-09-26: qty 5

## 2019-09-26 MED ORDER — HYDROMORPHONE HCL 1 MG/ML IJ SOLN
2.0000 mg | Freq: Once | INTRAMUSCULAR | Status: AC
  Administered 2019-09-26: 2 mg via INTRAVENOUS
  Filled 2019-09-26: qty 2

## 2019-09-26 NOTE — Evaluation (Signed)
Occupational Therapy Evaluation and Discharge Patient Details Name: Lee Leblanc MRN: [id number] DOB: [birth year] Today's Date: 09/26/2019    History of Present Illness Pt is a 74 y/o male who presents s/p L4-L5 PLIF on 09/25/2019.    Clinical Impression   All education completed as detailed below. Pt verbalized and/or demonstrated understanding. No further OT needs.    Follow Up Recommendations  No OT follow up    Equipment Recommendations  3 in 1 bedside commode    Recommendations for Other Services       Precautions / Restrictions Precautions Precautions: Fall;Back Precaution Booklet Issued: Yes (comment) Precaution Comments: Reviewed precautions during ADL and IADL Required Braces or Orthoses: Spinal Brace Spinal Brace: Lumbar corset;Applied in sitting position Restrictions Weight Bearing Restrictions: No      Mobility Bed Mobility Pt in chair. Transfers Overall transfer level: Modified independent Equipment used: None Transfers: Sit to/from Stand           General transfer comment: Increased time.    Balance Overall balance assessment: Needs assistance Sitting-balance support: No upper extremity supported;Feet supported Sitting balance-Leahy Scale: Good     Standing balance support: No upper extremity supported;During functional activity Standing balance-Leahy Scale: Fair                             ADL either performed or assessed with clinical judgement   ADL Overall ADL's : Needs assistance/impaired Eating/Feeding: Independent   Grooming: Supervision/safety;Standing Grooming Details (indicate cue type and reason): instructed in 2 cup method for oral care, use of washcloth on face Upper Body Bathing: Minimal assistance;Sitting Upper Body Bathing Details (indicate cue type and reason): recommended long handled bath sponge for back Lower Body Bathing: Minimal assistance Lower Body Bathing Details (indicate cue type and reason):  recommended long handled bath sponge for feet Upper Body Dressing : Set up;Sitting   Lower Body Dressing: Minimal assistance;Sit to/from stand;Sitting/lateral leans Lower Body Dressing Details (indicate cue type and reason): pt can typically don socks using figure 4 method, unable today due to pain, will rely on family until his is able to complete on his own Toilet Transfer: Supervision/safety;Ambulation Toilet Transfer Details (indicate cue type and reason): educated in use of 3 in 1 over toilet to elevate   Toileting - Clothing Manipulation Details (indicate cue type and reason): instructed to avoid twisting with pericare, use of tongs as needed     Functional mobility during ADLs: Modified independent General ADL Comments: Educated in body mechanics during IADL (washing dishes, emptying dish washer) and IADL to avoid.     Vision Patient Visual Report: No change from baseline       Perception     Praxis      Pertinent Vitals/Pain Pain Assessment: Faces Faces Pain Scale: Hurts a little bit Pain Location: Incision site Pain Descriptors / Indicators: Operative site guarding Pain Intervention(s): Monitored during session;Repositioned     Hand Dominance Right   Extremity/Trunk Assessment Upper Extremity Assessment Upper Extremity Assessment: RUE deficits/detail RUE Deficits / Details: reports longstanding limitations, has had arthroscopic surgery in the past RUE Coordination: decreased gross motor   Lower Extremity Assessment Lower Extremity Assessment: Defer to PT evaluation   Cervical / Trunk Assessment Cervical / Trunk Assessment: Other exceptions Cervical / Trunk Exceptions: s/p surgery   Communication Communication Communication: HOH   Cognition Arousal/Alertness: Awake/alert Behavior During Therapy: WFL for tasks assessed/performed Overall Cognitive Status: Impaired/Different from baseline Area of Impairment: Memory  Memory:  Decreased short-term memory         General Comments: likely baseline   General Comments       Exercises     Shoulder Instructions      Home Living Family/patient expects to be discharged to:: Private residence Living Arrangements: Spouse/significant other Available Help at Discharge: Family;Available 24 hours/day Type of Home: House Home Access: Level entry     Home Layout: Two level;Able to live on main level with bedroom/bathroom Alternate Level Stairs-Number of Steps: Flight   Bathroom Shower/Tub: Occupational psychologist: Standard     Home Equipment: None   Additional Comments: Pt will have son staying with him while wife is working (in from [place] starting sunday)      Prior Functioning/Environment Level of Independence: Independent        Comments: Retired        OT Problem List:        OT Treatment/Interventions:      OT Goals(Current goals can be found in the care plan section) Acute Rehab OT Goals Patient Stated Goal: Home today  OT Frequency:     Barriers to D/C:            Co-evaluation              AM-PAC OT "6 Clicks" Daily Activity     Outcome Measure Help from another person eating meals?: None Help from another person taking care of personal grooming?: None Help from another person toileting, which includes using toliet, bedpan, or urinal?: None Help from another person bathing (including washing, rinsing, drying)?: A Little Help from another person to put on and taking off regular upper body clothing?: None Help from another person to put on and taking off regular lower body clothing?: A Little 6 Click Score: 22   End of Session Equipment Utilized During Treatment: Back brace  Activity Tolerance: Patient tolerated treatment well Patient left: in chair;with call bell/phone within reach  OT Visit Diagnosis: Pain                Time: 0900-0919 OT Time Calculation (min): 19 min Charges:  OT General Charges $OT  Visit: 1 Visit OT Evaluation $OT Eval Low Complexity: 1 Low  Nestor Lewandowsky, OTR/L Acute Rehabilitation Services Pager: [phone] Office: [phone]  Malka So 09/26/2019, 12:02 PM

## 2019-09-26 NOTE — Evaluation (Signed)
Physical Therapy Evaluation and Discharge Patient Details Name: Lee Leblanc MRN: [id number] DOB: 11/07/1945 Today's Date: 09/26/2019   History of Present Illness  Pt is a 74 y/o male who presents s/p L4-L5 PLIF on 09/25/2019.   Clinical Impression  Patient evaluated by Physical Therapy with no further acute PT needs identified. All education has been completed and the patient has no further questions. Pt was able to demonstrate transfers and ambulation with gross modified independence and no AD. Pt was educated on precautions, brace application/wearing schedule, appropriate activity progression, and car transfer. See below for any follow-up Physical Therapy or equipment needs. PT is signing off. Thank you for this referral.     Follow Up Recommendations No PT follow up;Supervision - Intermittent    Equipment Recommendations  None recommended by PT    Recommendations for Other Services       Precautions / Restrictions Precautions Precautions: Fall;Back Precaution Booklet Issued: Yes (comment) Precaution Comments: Reviewed precautions during functional mobility.  Required Braces or Orthoses: Spinal Brace Spinal Brace: Lumbar corset;Applied in sitting position Restrictions Weight Bearing Restrictions: No      Mobility  Bed Mobility Overal bed mobility: Modified Independent Bed Mobility: Sidelying to Sit;Sit to Sidelying   Sidelying to sit: Modified independent (Device/Increase time)     Sit to sidelying: Modified independent (Device/Increase time) General bed mobility comments: Slow but able to complete without assistance. HOB flat and rails lowered to simulate home environment.   Transfers Overall transfer level: Modified independent Equipment used: None Transfers: Sit to/from Stand           General transfer comment: Increased time. Pt demonstrated proper hand placement on seated surface for safety.   Ambulation/Gait Ambulation/Gait assistance: Modified  independent (Device/Increase time) Gait Distance (Feet): 350 Feet Assistive device: None Gait Pattern/deviations: Step-through pattern;Decreased stride length;Trunk flexed;Drifts right/left Gait velocity: Decreased Gait velocity interpretation: 1.31 - 2.62 ft/sec, indicative of limited community ambulator General Gait Details: Overall steady without overt LOB noted. Pt was able to ambulate in the hall without AD. Trunk mildly flexed however likely baseline. Able to make corrective changes.   Stairs            Wheelchair Mobility    Modified Rankin (Stroke Patients Only)       Balance Overall balance assessment: Needs assistance Sitting-balance support: No upper extremity supported;Feet supported Sitting balance-Leahy Scale: Good     Standing balance support: No upper extremity supported;During functional activity Standing balance-Leahy Scale: Fair                               Pertinent Vitals/Pain Pain Assessment: Faces Faces Pain Scale: Hurts a little bit Pain Location: Incision site Pain Descriptors / Indicators: Operative site guarding Pain Intervention(s): Limited activity within patient's tolerance;Monitored during session;Repositioned    Home Living Family/patient expects to be discharged to:: Private residence Living Arrangements: Spouse/significant other Available Help at Discharge: Family;Available 24 hours/day Type of Home: House Home Access: Level entry     Home Layout: Two level;Able to live on main level with bedroom/bathroom Home Equipment: None Additional Comments: Pt will have son staying with him while wife is working (in from [place] starting sunday)    Prior Function Level of Independence: Independent         Comments: Retired     Journalist, newspaper        Extremity/Trunk Assessment   Upper Extremity Assessment Upper Extremity Assessment: Defer to OT evaluation  Lower Extremity Assessment Lower Extremity Assessment:  Overall WFL for tasks assessed (Mild weakness consistent with pre-op diagnosis)    Cervical / Trunk Assessment Cervical / Trunk Assessment: Other exceptions Cervical / Trunk Exceptions: s/p surgery  Communication   Communication: HOH (Wears hearing aids - does not have them today)  Cognition Arousal/Alertness: Awake/alert Behavior During Therapy: WFL for tasks assessed/performed Overall Cognitive Status: Within Functional Limits for tasks assessed                                        General Comments      Exercises     Assessment/Plan    PT Assessment Patent does not need any further PT services  PT Problem List         PT Treatment Interventions      PT Goals (Current goals can be found in the Care Plan section)  Acute Rehab PT Goals Patient Stated Goal: Home today PT Goal Formulation: All assessment and education complete, DC therapy    Frequency     Barriers to discharge        Co-evaluation               AM-PAC PT "6 Clicks" Mobility  Outcome Measure Help needed turning from your back to your side while in a flat bed without using bedrails?: None Help needed moving from lying on your back to sitting on the side of a flat bed without using bedrails?: None Help needed moving to and from a bed to a chair (including a wheelchair)?: None Help needed standing up from a chair using your arms (e.g., wheelchair or bedside chair)?: None Help needed to walk in hospital room?: None Help needed climbing 3-5 steps with a railing? : A Little 6 Click Score: 23    End of Session Equipment Utilized During Treatment: Back brace Activity Tolerance: Patient tolerated treatment well Patient left: in chair;with call bell/phone within reach Nurse Communication: Mobility status PT Visit Diagnosis: Unsteadiness on feet (R26.81);Pain Pain - part of body:  (back)    Time: 8185-6314 PT Time Calculation (min) (ACUTE ONLY): 22 min   Charges:   PT  Evaluation $PT Eval Low Complexity: 1 Low          Rolinda Roan, PT, DPT Acute Rehabilitation Services Pager: [phone] Office: [phone]   Thelma Comp 09/26/2019, 9:19 AM

## 2019-09-26 NOTE — Anesthesia Postprocedure Evaluation (Signed)
Anesthesia Post Note  Patient: Lee Leblanc  Procedure(s) Performed: Lumbar three-four Lumbar four-five Posterior lumbar interbody fusion (N/A Spine Lumbar)     Patient location during evaluation: PACU Anesthesia Type: General Level of consciousness: awake and alert Pain management: pain level controlled Vital Signs Assessment: post-procedure vital signs reviewed and stable Respiratory status: spontaneous breathing, nonlabored ventilation, respiratory function stable and patient connected to nasal cannula oxygen Cardiovascular status: blood pressure returned to baseline and stable Postop Assessment: no apparent nausea or vomiting Anesthetic complications: no   No complications documented.  Last Vitals:  Vitals:   09/26/19 0447 09/26/19 0738  BP: 101/64 108/68  Pulse: 70 84  Resp: 20 18  Temp: (!) 36.3 C   SpO2: 95% 96%    Last Pain:  Vitals:   09/26/19 0619  TempSrc:   PainSc: I[place]

## 2019-09-26 NOTE — Progress Notes (Signed)
Patient ID: Lee Leblanc, male   DOB: Mar 02, 1945, 74 y.o.   MRN: [id number] BP 110/62 (BP Location: Left Arm)   Pulse 76   Temp 97.6 F (36.4 C) (Oral)   Resp 18   Ht 6\' 2"  (1.88 m)   Wt 100.2 kg   SpO2 94%   BMI 28.37 kg/m  Alert and oriented x 4, speech is clear and fluent Moving All extremities well Wound with drainage,oversewn this afternoon. At inferior portion of the incision the bleeding was not drainage but emanating from the skin. Possible discharge tomorrow.

## 2019-09-27 MED ORDER — CYCLOBENZAPRINE HCL 5 MG PO TABS: 10.0000 mg | ORAL_TABLET | Freq: Three times a day (TID) | ORAL | 0 refills | Status: DC | PRN

## 2019-09-27 MED ORDER — LIDOCAINE HCL (PF) 1 % IJ SOLN
INTRAMUSCULAR | Status: AC
  Administered 2019-09-27: 1 mL
  Filled 2019-09-27: qty 5

## 2019-09-27 MED ORDER — HYDROMORPHONE HCL 1 MG/ML IJ SOLN
2.0000 mg | Freq: Once | INTRAMUSCULAR | Status: AC
  Administered 2019-09-27: 2 mg via INTRAMUSCULAR
  Filled 2019-09-27: qty 2

## 2019-09-27 MED ORDER — LIDOCAINE HCL (PF) 1 % IJ SOLN
INTRAMUSCULAR | Status: AC
  Filled 2019-09-27: qty 5

## 2019-09-27 MED ORDER — SENNOSIDES-DOCUSATE SODIUM 8.6-50 MG PO TABS: 1.0000 | ORAL_TABLET | Freq: Every evening | ORAL | 1 refills | Status: DC | PRN

## 2019-09-27 MED ORDER — OXYCODONE HCL 10 MG PO TABS
10.0000 mg | ORAL_TABLET | ORAL | 0 refills | Status: DC | PRN
Start: 2019-09-27 — End: 2020-09-29

## 2019-09-27 NOTE — Progress Notes (Signed)
Patient is discharged from room 3C05 at this time. Alert and in stable condition. IV site d/c'd and instructions read to patient and spouse with understanding verbalized and all questions answered. Left unit via wheelchair with all belongings at side.

## 2019-09-27 NOTE — Discharge Summary (Signed)
   Physician Discharge Summary  Patient ID: Lee Leblanc MRN: [id number] DOB/AGE: [birth year] 74 y.o.  Admit date: 09/25/2019 Discharge date: 09/27/2019  Admission Diagnoses:  Spondylolisthesis, Lumbar regionL4/5, Lumbar stenosis, lateral recess stenosis L3/4 DDD L3/4,4/5  Discharge Diagnoses:  Same Active Problems:   Spondylolisthesis of lumbar region   Discharged Condition: Stable  Hospital Course:  Lee Leblanc is a 74 y.o. male who underwent the below surgery and tolerated it well. He was at his neurologic baseline upon discharge and doing well with therapy. His pain was controlled on oral medication. He had some minor wound drainage that was over sewn and stable. He was having normal bowel and bladder function at discharge.   Treatments: Surgery - Lumbar 3/4, 4/5 plif with titanium cascadia([employer]) cages 11x44mm filled with autograft morsels Laminectomy beyond the needed exposure for a plif at L3 and L4 Segmental pedicle screw fixation relign screws(nuvasive) L3-L5  Discharge Exam: Blood pressure 102/64, pulse 96, temperature 99.9 F (37.7 C), temperature source Oral, resp. rate 18, height 6\' 2"  (1.88 m), weight 100.2 kg, SpO2 96 %. Awake, alert, oriented Speech fluent, appropriate CNs grossly intact 5/5 BUE/BLE Wound c/d/i  Disposition: Discharge disposition: 01-Home or Self Care        Allergies as of 09/27/2019   No Known Allergies     Medication List    STOP taking these medications   aspirin 81 MG tablet     TAKE these medications   amLODipine 5 MG tablet Commonly known as: NORVASC Take 5 mg by mouth daily.   cetirizine 10 MG tablet Commonly known as: ZYRTEC Take 10 mg by mouth daily.   citalopram 20 MG tablet Commonly known as: CELEXA Take 20 mg by mouth daily. What changed: Another medication with the same name was removed. Continue taking this medication, and follow the directions you see here.   cyclobenzaprine 5 MG tablet Commonly  known as: FLEXERIL Take 2 tablets (10 mg total) by mouth 3 (three) times daily as needed for muscle spasms.   fluticasone 50 MCG/ACT nasal spray Commonly known as: FLONASE Place 1 spray into both nostrils daily.   losartan-hydrochlorothiazide 100-25 MG tablet Commonly known as: HYZAAR Take 1 tablet by mouth daily.   montelukast 10 MG tablet Commonly known as: SINGULAIR Take 10 mg by mouth at bedtime.   Oxycodone HCl 10 MG Tabs Take 1 tablet (10 mg total) by mouth every 3 (three) hours as needed for severe pain ((score 7 to 10)).   pantoprazole 40 MG tablet Commonly known as: PROTONIX Take 40 mg by mouth daily. PRN   senna-docusate 8.6-50 MG tablet Commonly known as: Senokot-S Take 1 tablet by mouth at bedtime as needed for mild constipation.   traZODone 50 MG tablet Commonly known as: DESYREL Take 50 mg by mouth at bedtime.       Follow-up Information    Ashok Pall, MD Follow up in 3 week(s).   Specialty: Neurosurgery Why: call for appointment Contact information: [address] [place] [address] [phone]               Signed: Theodoro Doing Lee Leblanc 09/27/2019, 9:36 AM

## 2019-09-27 NOTE — Discharge Instructions (Signed)

## 2019-10-01 MED FILL — Sodium Chloride IV Soln 0.9%: INTRAVENOUS | Qty: 1000 | Status: AC

## 2019-10-01 MED FILL — Sodium Chloride Irrigation Soln 0.9%: Qty: 3000 | Status: AC

## 2019-10-01 MED FILL — Heparin Sodium (Porcine) Inj 1000 Unit/ML: INTRAMUSCULAR | Qty: 30 | Status: AC

## 2019-10-20 DIAGNOSIS — M4316 Spondylolisthesis, lumbar region: Secondary | ICD-10-CM | POA: Diagnosis not present

## 2019-11-17 DIAGNOSIS — N50819 Testicular pain, unspecified: Secondary | ICD-10-CM | POA: Diagnosis not present

## 2019-11-17 DIAGNOSIS — Z23 Encounter for immunization: Secondary | ICD-10-CM | POA: Diagnosis not present

## 2019-12-22 DIAGNOSIS — N451 Epididymitis: Secondary | ICD-10-CM | POA: Diagnosis not present

## 2019-12-22 DIAGNOSIS — R3914 Feeling of incomplete bladder emptying: Secondary | ICD-10-CM | POA: Diagnosis not present

## 2019-12-22 DIAGNOSIS — Z125 Encounter for screening for malignant neoplasm of prostate: Secondary | ICD-10-CM | POA: Diagnosis not present

## 2019-12-22 DIAGNOSIS — N401 Enlarged prostate with lower urinary tract symptoms: Secondary | ICD-10-CM | POA: Diagnosis not present

## 2019-12-22 DIAGNOSIS — N50811 Right testicular pain: Secondary | ICD-10-CM | POA: Diagnosis not present

## 2019-12-22 DIAGNOSIS — N50812 Left testicular pain: Secondary | ICD-10-CM | POA: Diagnosis not present

## 2019-12-22 DIAGNOSIS — R3912 Poor urinary stream: Secondary | ICD-10-CM | POA: Diagnosis not present

## 2019-12-24 DIAGNOSIS — Z0100 Encounter for examination of eyes and vision without abnormal findings: Secondary | ICD-10-CM | POA: Diagnosis not present

## 2019-12-24 DIAGNOSIS — H5210 Myopia, unspecified eye: Secondary | ICD-10-CM | POA: Diagnosis not present

## 2019-12-29 DIAGNOSIS — R69 Illness, unspecified: Secondary | ICD-10-CM | POA: Diagnosis not present

## 2020-06-15 DIAGNOSIS — M48062 Spinal stenosis, lumbar region with neurogenic claudication: Secondary | ICD-10-CM | POA: Diagnosis not present

## 2020-06-15 DIAGNOSIS — M4726 Other spondylosis with radiculopathy, lumbar region: Secondary | ICD-10-CM | POA: Diagnosis not present

## 2020-07-19 ENCOUNTER — Other Ambulatory Visit: Payer: Self-pay | Admitting: *Deleted

## 2020-07-19 DIAGNOSIS — I712 Thoracic aortic aneurysm, without rupture, unspecified: Secondary | ICD-10-CM

## 2020-07-20 DIAGNOSIS — R200 Anesthesia of skin: Secondary | ICD-10-CM | POA: Diagnosis not present

## 2020-08-23 DIAGNOSIS — M65342 Trigger finger, left ring finger: Secondary | ICD-10-CM | POA: Diagnosis not present

## 2020-08-23 DIAGNOSIS — M65322 Trigger finger, left index finger: Secondary | ICD-10-CM | POA: Diagnosis not present

## 2020-09-02 ENCOUNTER — Ambulatory Visit
Admission: RE | Admit: 2020-09-02 | Discharge: 2020-09-02 | Disposition: A | Discharge status: Home / Self Care | Payer: Medicare HMO | Source: Ambulatory Visit | Attending: Cardiothoracic Surgery | Admitting: Cardiothoracic Surgery

## 2020-09-02 ENCOUNTER — Other Ambulatory Visit: Payer: Self-pay

## 2020-09-02 ENCOUNTER — Ambulatory Visit: Payer: Medicare HMO | Admitting: Cardiothoracic Surgery

## 2020-09-02 VITALS — BP 118/74 | HR 81 | Resp 20 | Ht 74.0 in | Wt 214.0 lb

## 2020-09-02 DIAGNOSIS — I712 Thoracic aortic aneurysm, without rupture, unspecified: Secondary | ICD-10-CM

## 2020-09-02 DIAGNOSIS — I700 Atherosclerosis of aorta: Secondary | ICD-10-CM | POA: Diagnosis not present

## 2020-09-02 DIAGNOSIS — R911 Solitary pulmonary nodule: Secondary | ICD-10-CM | POA: Diagnosis not present

## 2020-09-02 DIAGNOSIS — J439 Emphysema, unspecified: Secondary | ICD-10-CM | POA: Diagnosis not present

## 2020-09-02 NOTE — Progress Notes (Signed)
Chief complaint: Aneurysm surveillance  History of present illness: 75 year old man with at least a 50-pack-year history of smoking presents for surveillance of ascending aortic aneurysm.  He had previously Leblanc Dr. Prescott Leblanc approximately 18 months ago.  His ascending aorta at the time was measuring approximate 4.5 cm.  Since then he is had good blood pressure control and has no complaints.  Incidentally, the patient also has recognition of a right lung mass.  He has stopped smoking several years ago.  Active Ambulatory Problems    Diagnosis Date Noted   Dyspnea 01/13/2014   Solitary pulmonary nodule 01/15/2014   Pleural plaque without asbestosis 01/15/2014   Chest pain 05/26/2014   Acute pericarditis by MRI 05/27/2014   Renal insufficiency, mild (GFR 63) 05/27/2014   Essential hypertension 05/27/2014   COPD with emphysema (Lee Leblanc) 05/27/2014   Thoracic aortic aneurysm (Lee Leblanc) 03/05/2019   Spondylolisthesis of lumbar region 09/25/2019   Resolved Ambulatory Problems    Diagnosis Date Noted   No Resolved Ambulatory Problems   Past Medical History:  Diagnosis Date   COPD (chronic obstructive pulmonary disease) (HCC)    Depression    GERD (gastroesophageal reflux disease)    Hypertension      Current Outpatient Medications on File Prior to Visit  Medication Sig Dispense Refill   amLODipine (NORVASC) 5 MG tablet Take 5 mg by mouth daily.     cetirizine (ZYRTEC) 10 MG tablet Take 10 mg by mouth daily.     citalopram (CELEXA) 20 MG tablet Take 20 mg by mouth daily.     fluticasone (FLONASE) 50 MCG/ACT nasal spray Place 1 spray into both nostrils daily.     losartan-hydrochlorothiazide (HYZAAR) 100-25 MG per tablet Take 1 tablet by mouth daily.      montelukast (SINGULAIR) 10 MG tablet Take 10 mg by mouth at bedtime.     traZODone (DESYREL) 50 MG tablet Take 50 mg by mouth at bedtime.     aspirin 81 MG tablet Take 81 mg by mouth daily. (Patient not taking: Reported on 09/02/2020)      cyclobenzaprine (FLEXERIL) 5 MG tablet Take 2 tablets (10 mg total) by mouth 3 (three) times daily as needed for muscle spasms. (Patient not taking: Reported on 09/02/2020) 60 tablet 0   oxyCODONE 10 MG TABS Take 1 tablet (10 mg total) by mouth every 3 (three) hours as needed for severe pain ((score 7 to 10)). (Patient not taking: Reported on 09/02/2020) 30 tablet 0   pantoprazole (PROTONIX) 40 MG tablet Take 40 mg by mouth daily. PRN (Patient not taking: Reported on 09/02/2020)     senna-docusate (SENOKOT-S) 8.6-50 MG tablet Take 1 tablet by mouth at bedtime as needed for mild constipation. (Patient not taking: Reported on 09/02/2020) 30 tablet 1   No current facility-administered medications on file prior to visit.   Physical exam: BP 118/74   Pulse 81   Resp 20   Ht 6\' 2"  (1.88 m)   Wt 97.1 kg   SpO2 95% Comment: RA  BMI 27.48 kg/m  Well-appearing man no acute distress Alert and oriented x3 HEENT: No cervical or supraclavicular lymphadenopathy Chest clear to auscultation Heart regular rate and rhythm without murmur  Imaging: I personally reviewed his CT scan of the chest from today which demonstrates a stable ascending aortic aneurysm and an enlarging right lung mass.  This is cavitary in nature.  The maximal diameter is approximately 2.2 cm  Assessment/plan: 75 year old man with a history of smoking and incidentally discovered  ascending aortic aneurysm.  This is not threatening and does not warrant further intervention at this time.  Follow-up in 1 year with repeat CT scan  Lung mass: Refer to Dr. Valeta Leblanc, pulmonology for work-up and potential diagnostic intervention.  These plans have been discussed with the patient, who agrees and wishes to proceed.  Lee Leblanc Z. Lee Leblanc, [place]

## 2020-09-29 ENCOUNTER — Ambulatory Visit: Payer: Medicare HMO | Admitting: Pulmonary Disease

## 2020-09-29 ENCOUNTER — Telehealth: Payer: Self-pay | Admitting: Pulmonary Disease

## 2020-09-29 ENCOUNTER — Encounter: Payer: Self-pay | Admitting: Pulmonary Disease

## 2020-09-29 ENCOUNTER — Other Ambulatory Visit: Payer: Self-pay

## 2020-09-29 VITALS — BP 112/70 | HR 68 | Temp 97.7°F | Ht 74.0 in | Wt 218.6 lb

## 2020-09-29 DIAGNOSIS — Z87891 Personal history of nicotine dependence: Secondary | ICD-10-CM | POA: Diagnosis not present

## 2020-09-29 DIAGNOSIS — J432 Centrilobular emphysema: Secondary | ICD-10-CM | POA: Diagnosis not present

## 2020-09-29 DIAGNOSIS — R918 Other nonspecific abnormal finding of lung field: Secondary | ICD-10-CM | POA: Diagnosis not present

## 2020-09-29 DIAGNOSIS — R911 Solitary pulmonary nodule: Secondary | ICD-10-CM

## 2020-09-29 DIAGNOSIS — J920 Pleural plaque with presence of asbestos: Secondary | ICD-10-CM | POA: Diagnosis not present

## 2020-09-29 NOTE — Patient Instructions (Signed)
Thank you for visiting Dr. Valeta Harms at [facility] er Pulmonary. Today we recommend the following: Orders Placed This Encounter  Procedures   Procedural/ Surgical Case Request: VIDEO BRONCHOSCOPY WITH ENDOBRONCHIAL NAVIGATION   NM PET Image Initial (PI) Skull Base To Thigh (F-18 FDG)   Ambulatory referral to Pulmonology   Bronchoscopy scheduled for 10/26/2020 Nothing to eat past midnight the night before.   Return in about 5 weeks (around 11/03/2020) for with APP or Dr. Valeta Harms.    Please do your part to reduce the spread of COVID-19.

## 2020-09-29 NOTE — Progress Notes (Signed)
I think  Synopsis: Referred in August 2022 for lung nodule by Wonda Olds, MD  Subjective:   PATIENT ID: Enedina Finner GENDER: male DOB: [birth year], MRN: [id number]  Chief Complaint  Patient presents with   Consult    Pt is here for a consult due to lung mass.  Pt states he had a CT performed which showed a spot on the lung which has increased in size. States that he does have complaints of SOB with exertion.     This is a 75 year old gentleman, COPD, GERD, hypertension.  Referred for abnormal CT imaging.  Seen in the cardiothoracic surgery office for aneurysm surveillance.  He is a 75 year old with a 50-pack-year history of smoking.  Previously followed by Dr. Lucianne Lei trite and seen again in follow-up by Dr. 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  Incidentally was found to have a right lung nodule.  Patient CT imaging was completed on 09/02/2020.  This revealed a progressive nonsolid nodule subsolid in nature for the posterior right upper lobe areas of groundglass suspicious for a low-grade indolent neoplasm.  OV 09/29/2020: Here today to review the CT imaging.  We discussed this today in the office.  Patient has no significant complaints at this time.   Past Medical History:  Diagnosis Date   COPD (chronic obstructive pulmonary disease) (HCC)    has not used albuterol inhaler for last 2 months   Depression    GERD (gastroesophageal reflux disease)    Hypertension      Family History  Problem Relation Age of Onset   Allergies Daughter    Allergies Son    Leukemia Sister    Prostate cancer Father      Past Surgical History:  Procedure Laterality Date   BACK SURGERY    lasdt [phone]   lower x 7   COLONOSCOPY WITH PROPOFOL N/A 06/25/2012   Procedure: COLONOSCOPY WITH PROPOFOL;  Surgeon: Garlan Fair, MD;  Location: [facility];  Service: Endoscopy;  Laterality: N/A;   ESOPHAGOGASTRODUODENOSCOPY (EGD) WITH PROPOFOL N/A 06/25/2012   Procedure: ESOPHAGOGASTRODUODENOSCOPY  (EGD) WITH PROPOFOL;  Surgeon: Garlan Fair, MD;  Location: [facility]COPY;  Service: Endoscopy;  Laterality: N/A;   EYE SURGERY Right    SHOULDER ARTHROSCOPY Right     Social History   Socioeconomic History   Marital status: Married    Spouse name: Not on file   Number of children: Not on file   Years of education: Not on file   Highest education level: Not on file  Occupational History   Occupation: Retired  Tobacco Use   Smoking status: Former    Packs/day: 0.50    Years: 50.00    Pack years: 25.00    Types: Cigarettes    Start date: 02/06/1961    Quit date: 02/07/2011    Years since quitting: 9.6   Smokeless tobacco: Never  Substance and Sexual Activity   Alcohol use: Yes    Alcohol/week: 0.0 standard drinks    Comment: occasional   Drug use: No   Sexual activity: Not on file  Other Topics Concern   Not on file  Social History Narrative   Not on file   Social Determinants of Health   Financial Resource Strain: Not on file  Food Insecurity: Not on file  Transportation Needs: Not on file  Physical Activity: Not on file  Stress: Not on file  Social Connections: Not on file  Intimate Partner Violence: Not on file  No Known Allergies   Outpatient Medications Prior to Visit  Medication Sig Dispense Refill   amLODipine (NORVASC) 5 MG tablet Take 5 mg by mouth daily.     cetirizine (ZYRTEC) 10 MG tablet Take 10 mg by mouth daily.     citalopram (CELEXA) 20 MG tablet Take 20 mg by mouth daily.     dextromethorphan-guaiFENesin (MUCINEX DM) 30-600 MG 12hr tablet Take 1 tablet by mouth 2 (two) times daily.     fluticasone (FLONASE) 50 MCG/ACT nasal spray Place 1 spray into both nostrils daily.     HYDROcodone-acetaminophen (NORCO/VICODIN) 5-325 MG tablet Take by mouth.     losartan-hydrochlorothiazide (HYZAAR) 100-25 MG per tablet Take 1 tablet by mouth daily.      montelukast (SINGULAIR) 10 MG tablet Take 10 mg by mouth at bedtime.     traZODone (DESYREL) 50 MG  tablet Take 50 mg by mouth at bedtime.     aspirin 81 MG tablet Take 81 mg by mouth daily. (Patient not taking: Reported on 09/02/2020)     cyclobenzaprine (FLEXERIL) 5 MG tablet Take 2 tablets (10 mg total) by mouth 3 (three) times daily as needed for muscle spasms. (Patient not taking: Reported on 09/02/2020) 60 tablet 0   oxyCODONE 10 MG TABS Take 1 tablet (10 mg total) by mouth every 3 (three) hours as needed for severe pain ((score 7 to 10)). (Patient not taking: Reported on 09/02/2020) 30 tablet 0   pantoprazole (PROTONIX) 40 MG tablet Take 40 mg by mouth daily. PRN (Patient not taking: Reported on 09/02/2020)     senna-docusate (SENOKOT-S) 8.6-50 MG tablet Take 1 tablet by mouth at bedtime as needed for mild constipation. (Patient not taking: Reported on 09/02/2020) 30 tablet 1   No facility-administered medications prior to visit.    Review of Systems  Constitutional:  Negative for chills, fever, malaise/fatigue and weight loss.  HENT:  Negative for hearing loss, sore throat and tinnitus.   Eyes:  Negative for blurred vision and double vision.  Respiratory:  Negative for cough, hemoptysis, sputum production, shortness of breath, wheezing and stridor.   Cardiovascular:  Negative for chest pain, palpitations, orthopnea, leg swelling and PND.  Gastrointestinal:  Negative for abdominal pain, constipation, diarrhea, heartburn, nausea and vomiting.  Genitourinary:  Negative for dysuria, hematuria and urgency.  Musculoskeletal:  Negative for joint pain and myalgias.  Skin:  Negative for itching and rash.  Neurological:  Negative for dizziness, tingling, weakness and headaches.  Endo/Heme/Allergies:  Negative for environmental allergies. Does not bruise/bleed easily.  Psychiatric/Behavioral:  Negative for depression. The patient is not nervous/anxious and does not have insomnia.   All other systems reviewed and are negative.   Objective:  Physical Exam Vitals reviewed.  Constitutional:       General: He is not in acute distress.    Appearance: He is well-developed.  HENT:     Head: Normocephalic and atraumatic.  Eyes:     General: No scleral icterus.    Conjunctiva/sclera: Conjunctivae normal.     Pupils: Pupils are equal, round, and reactive to light.  Neck:     Vascular: No JVD.     Trachea: No tracheal deviation.  Cardiovascular:     Rate and Rhythm: Normal rate and regular rhythm.     Heart sounds: Normal heart sounds. No murmur heard. Pulmonary:     Effort: Pulmonary effort is normal. No tachypnea, accessory muscle usage or respiratory distress.     Breath sounds: No stridor. No wheezing, rhonchi  or rales.  Abdominal:     General: Bowel sounds are normal. There is no distension.     Palpations: Abdomen is soft.     Tenderness: There is no abdominal tenderness.  Musculoskeletal:        General: No tenderness.     Cervical back: Neck supple.  Lymphadenopathy:     Cervical: No cervical adenopathy.  Skin:    General: Skin is warm and dry.     Capillary Refill: Capillary refill takes less than 2 seconds.     Findings: No rash.  Neurological:     Mental Status: He is alert and oriented to person, place, and time.  Psychiatric:        Behavior: Behavior normal.     Vitals:   09/29/20 1127  BP: 112/70  Pulse: 68  Temp: 97.7 F (36.5 C)  TempSrc: Oral  SpO2: 98%  Weight: 218 lb 9.6 oz (99.2 kg)  Height: 6\' 2"  (1.88 m)   98% on RA BMI Readings from Last 3 Encounters:  09/29/20 28.07 kg/m  09/02/20 27.48 kg/m  09/25/19 28.37 kg/m   Wt Readings from Last 3 Encounters:  09/29/20 218 lb 9.6 oz (99.2 kg)  09/02/20 214 lb (97.1 kg)  09/25/19 221 lb (100.2 kg)     CBC    Component Value Date/Time   WBC 6.1 09/17/2019 0916   RBC 5.25 09/17/2019 0916   HGB 12.9 (L) 09/25/2019 1755   HCT 38.0 (L) 09/25/2019 1755   PLT 330 09/17/2019 0916   MCV 89.0 09/17/2019 0916   MCH 30.5 09/17/2019 0916   MCHC 34.3 09/17/2019 0916   RDW 14.0 09/17/2019 0916     Chest Imaging:  09/02/2020 CT chest: Right upper lobe posterior groundglass lesion concerning for an indolent low-grade neoplasm. The patient's images have been independently reviewed by me.    Pulmonary Functions Testing Results: PFT Results Latest Ref Rng & Units 06/13/2013  FVC-Pre L 4.43  FVC-Predicted Pre % 102  FVC-Post L 4.38  FVC-Predicted Post % 101  Pre FEV1/FVC % % 74  Post FEV1/FCV % % 79  FEV1-Pre L 3.29  FEV1-Predicted Pre % 99  FEV1-Post L 3.48  DLCO uncorrected ml/min/mmHg 24.67  DLCO UNC% % 67  DLCO corrected ml/min/mmHg 24.67  DLCO COR %Predicted % 67  DLVA Predicted % 71  TLC L 7.51  TLC % Predicted % 98  RV % Predicted % 112    FeNO: no  Pathology: no  Echocardiogram: no  Heart Catheterization: no    Assessment & Plan:     ICD-10-CM   1. Nodule of upper lobe of right lung  R91.1 NM PET Image Initial (PI) Skull Base To Thigh (F-18 FDG)    Ambulatory referral to Pulmonology    Procedural/ Surgical Case Request: VIDEO BRONCHOSCOPY WITH ENDOBRONCHIAL NAVIGATION    2. Ground glass opacity present on imaging of lung  R91.8     3. Former smoker  Z87.891     4. Centrilobular emphysema ([place])  J43.2     5. Calcified pleural plaque due to asbestos exposure  J92.0       Discussion: This is a 75 year old former smoker, quit 5 years ago, had an incidentally found right upper lobe subsolid groundglass lesion concerning for an indolent carcinoma.  Additionally has a right lower lobe rounded somewhat bilobed nodule adjacent to pleural plaque.  Plan: That he needs to start with a PET scan. I like to see if the nodule that is subpleural in  the lower lobe has any PET uptake. The upper lobe lesion I do believe is a stage I malignancy. We may be dealing with metachronous primaries in this case. The lower lobe lesion if there is no significant metabolic uptake may consider observation versus biopsy.  It is in a relatively high risk location due to its  subpleural nature.  Today in the office we discussed the risk benefits and alternatives of proceeding with robotic bronchoscopy.  Patient is agreeable to proceed. We will have a PET scan completed before case is done to ensure appropriate planning. We will try to convert his most recent CT scan to a super D format to be used for navigation. Tentative bronchoscopy day on 10/26/2020.  Additional time spent today with PCC's regarding scheduling of appropriate imaging and procedure.    Current Outpatient Medications:    amLODipine (NORVASC) 5 MG tablet, Take 5 mg by mouth daily., Disp: , Rfl:    cetirizine (ZYRTEC) 10 MG tablet, Take 10 mg by mouth daily., Disp: , Rfl:    citalopram (CELEXA) 20 MG tablet, Take 20 mg by mouth daily., Disp: , Rfl:    dextromethorphan-guaiFENesin (MUCINEX DM) 30-600 MG 12hr tablet, Take 1 tablet by mouth 2 (two) times daily., Disp: , Rfl:    fluticasone (FLONASE) 50 MCG/ACT nasal spray, Place 1 spray into both nostrils daily., Disp: , Rfl:    HYDROcodone-acetaminophen (NORCO/VICODIN) 5-325 MG tablet, Take by mouth., Disp: , Rfl:    losartan-hydrochlorothiazide (HYZAAR) 100-25 MG per tablet, Take 1 tablet by mouth daily. , Disp: , Rfl:    montelukast (SINGULAIR) 10 MG tablet, Take 10 mg by mouth at bedtime., Disp: , Rfl:    traZODone (DESYREL) 50 MG tablet, Take 50 mg by mouth at bedtime., Disp: , Rfl:   I spent 62 minutes dedicated to the care of this patient on the date of this encounter to include pre-visit review of records, face-to-face time with the patient discussing conditions above, post visit ordering of testing, clinical documentation with the electronic health record, making appropriate referrals as documented, and communicating necessary findings to members of the patients care team.   Garner Nash, DO [facility] 09/29/2020 11:35 AM

## 2020-09-29 NOTE — H&P (View-Only) (Signed)
I think  Synopsis: Referred in August 2022 for lung nodule by Wonda Olds, MD  Subjective:   PATIENT ID: Lee Leblanc GENDER: male DOB: [birth year], MRN: [id number]  Chief Complaint  Patient presents with   Consult    Pt is here for a consult due to lung mass.  Pt states he had a CT performed which showed a spot on the lung which has increased in size. States that he does have complaints of SOB with exertion.     This is a 75 year old gentleman, COPD, GERD, hypertension.  Referred for abnormal CT imaging.  Seen in the cardiothoracic surgery office for aneurysm surveillance.  He is a 75 year old with a 50-pack-year history of smoking.  Previously followed by Dr. Lucianne Lei trite and seen again in follow-up by Dr. 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  Incidentally was found to have a right lung nodule.  Patient CT imaging was completed on 09/02/2020.  This revealed a progressive nonsolid nodule subsolid in nature for the posterior right upper lobe areas of groundglass suspicious for a low-grade indolent neoplasm.  OV 09/29/2020: Here today to review the CT imaging.  We discussed this today in the office.  Patient has no significant complaints at this time.   Past Medical History:  Diagnosis Date   COPD (chronic obstructive pulmonary disease) (HCC)    has not used albuterol inhaler for last 2 months   Depression    GERD (gastroesophageal reflux disease)    Hypertension      Family History  Problem Relation Age of Onset   Allergies Daughter    Allergies Son    Leukemia Sister    Prostate cancer Father      Past Surgical History:  Procedure Laterality Date   BACK SURGERY    lasdt [phone]   lower x 7   COLONOSCOPY WITH PROPOFOL N/A 06/25/2012   Procedure: COLONOSCOPY WITH PROPOFOL;  Surgeon: Garlan Fair, MD;  Location: [facility];  Service: Endoscopy;  Laterality: N/A;   ESOPHAGOGASTRODUODENOSCOPY (EGD) WITH PROPOFOL N/A 06/25/2012   Procedure: ESOPHAGOGASTRODUODENOSCOPY  (EGD) WITH PROPOFOL;  Surgeon: Garlan Fair, MD;  Location: [facility];  Service: Endoscopy;  Laterality: N/A;   EYE SURGERY Right    SHOULDER ARTHROSCOPY Right     Social History   Socioeconomic History   Marital status: Married    Spouse name: Not on file   Number of children: Not on file   Years of education: Not on file   Highest education level: Not on file  Occupational History   Occupation: Retired  Tobacco Use   Smoking status: Former    Packs/day: 0.50    Years: 50.00    Pack years: 25.00    Types: Cigarettes    Start date: 02/06/1961    Quit date: 02/07/2011    Years since quitting: 9.6   Smokeless tobacco: Never  Substance and Sexual Activity   Alcohol use: Yes    Alcohol/week: 0.0 standard drinks    Comment: occasional   Drug use: No   Sexual activity: Not on file  Other Topics Concern   Not on file  Social History Narrative   Not on file   Social Determinants of Health   Financial Resource Strain: Not on file  Food Insecurity: Not on file  Transportation Needs: Not on file  Physical Activity: Not on file  Stress: Not on file  Social Connections: Not on file  Intimate Partner Violence: Not on file  No Known Allergies   Outpatient Medications Prior to Visit  Medication Sig Dispense Refill   amLODipine (NORVASC) 5 MG tablet Take 5 mg by mouth daily.     cetirizine (ZYRTEC) 10 MG tablet Take 10 mg by mouth daily.     citalopram (CELEXA) 20 MG tablet Take 20 mg by mouth daily.     dextromethorphan-guaiFENesin (MUCINEX DM) 30-600 MG 12hr tablet Take 1 tablet by mouth 2 (two) times daily.     fluticasone (FLONASE) 50 MCG/ACT nasal spray Place 1 spray into both nostrils daily.     HYDROcodone-acetaminophen (NORCO/VICODIN) 5-325 MG tablet Take by mouth.     losartan-hydrochlorothiazide (HYZAAR) 100-25 MG per tablet Take 1 tablet by mouth daily.      montelukast (SINGULAIR) 10 MG tablet Take 10 mg by mouth at bedtime.     traZODone (DESYREL) 50 MG  tablet Take 50 mg by mouth at bedtime.     aspirin 81 MG tablet Take 81 mg by mouth daily. (Patient not taking: Reported on 09/02/2020)     cyclobenzaprine (FLEXERIL) 5 MG tablet Take 2 tablets (10 mg total) by mouth 3 (three) times daily as needed for muscle spasms. (Patient not taking: Reported on 09/02/2020) 60 tablet 0   oxyCODONE 10 MG TABS Take 1 tablet (10 mg total) by mouth every 3 (three) hours as needed for severe pain ((score 7 to 10)). (Patient not taking: Reported on 09/02/2020) 30 tablet 0   pantoprazole (PROTONIX) 40 MG tablet Take 40 mg by mouth daily. PRN (Patient not taking: Reported on 09/02/2020)     senna-docusate (SENOKOT-S) 8.6-50 MG tablet Take 1 tablet by mouth at bedtime as needed for mild constipation. (Patient not taking: Reported on 09/02/2020) 30 tablet 1   No facility-administered medications prior to visit.    Review of Systems  Constitutional:  Negative for chills, fever, malaise/fatigue and weight loss.  HENT:  Negative for hearing loss, sore throat and tinnitus.   Eyes:  Negative for blurred vision and double vision.  Respiratory:  Negative for cough, hemoptysis, sputum production, shortness of breath, wheezing and stridor.   Cardiovascular:  Negative for chest pain, palpitations, orthopnea, leg swelling and PND.  Gastrointestinal:  Negative for abdominal pain, constipation, diarrhea, heartburn, nausea and vomiting.  Genitourinary:  Negative for dysuria, hematuria and urgency.  Musculoskeletal:  Negative for joint pain and myalgias.  Skin:  Negative for itching and rash.  Neurological:  Negative for dizziness, tingling, weakness and headaches.  Endo/Heme/Allergies:  Negative for environmental allergies. Does not bruise/bleed easily.  Psychiatric/Behavioral:  Negative for depression. The patient is not nervous/anxious and does not have insomnia.   All other systems reviewed and are negative.   Objective:  Physical Exam Vitals reviewed.  Constitutional:       General: He is not in acute distress.    Appearance: He is well-developed.  HENT:     Head: Normocephalic and atraumatic.  Eyes:     General: No scleral icterus.    Conjunctiva/sclera: Conjunctivae normal.     Pupils: Pupils are equal, round, and reactive to light.  Neck:     Vascular: No JVD.     Trachea: No tracheal deviation.  Cardiovascular:     Rate and Rhythm: Normal rate and regular rhythm.     Heart sounds: Normal heart sounds. No murmur heard. Pulmonary:     Effort: Pulmonary effort is normal. No tachypnea, accessory muscle usage or respiratory distress.     Breath sounds: No stridor. No wheezing, rhonchi  or rales.  Abdominal:     General: Bowel sounds are normal. There is no distension.     Palpations: Abdomen is soft.     Tenderness: There is no abdominal tenderness.  Musculoskeletal:        General: No tenderness.     Cervical back: Neck supple.  Lymphadenopathy:     Cervical: No cervical adenopathy.  Skin:    General: Skin is warm and dry.     Capillary Refill: Capillary refill takes less than 2 seconds.     Findings: No rash.  Neurological:     Mental Status: He is alert and oriented to person, place, and time.  Psychiatric:        Behavior: Behavior normal.     Vitals:   09/29/20 1127  BP: 112/70  Pulse: 68  Temp: 97.7 F (36.5 C)  TempSrc: Oral  SpO2: 98%  Weight: 218 lb 9.6 oz (99.2 kg)  Height: 6\' 2"  (1.88 m)   98% on RA BMI Readings from Last 3 Encounters:  09/29/20 28.07 kg/m  09/02/20 27.48 kg/m  09/25/19 28.37 kg/m   Wt Readings from Last 3 Encounters:  09/29/20 218 lb 9.6 oz (99.2 kg)  09/02/20 214 lb (97.1 kg)  09/25/19 221 lb (100.2 kg)     CBC    Component Value Date/Time   WBC 6.1 09/17/2019 0916   RBC 5.25 09/17/2019 0916   HGB 12.9 (L) 09/25/2019 1755   HCT 38.0 (L) 09/25/2019 1755   PLT 330 09/17/2019 0916   MCV 89.0 09/17/2019 0916   MCH 30.5 09/17/2019 0916   MCHC 34.3 09/17/2019 0916   RDW 14.0 09/17/2019 0916     Chest Imaging:  09/02/2020 CT chest: Right upper lobe posterior groundglass lesion concerning for an indolent low-grade neoplasm. The patient's images have been independently reviewed by me.    Pulmonary Functions Testing Results: PFT Results Latest Ref Rng & Units 06/13/2013  FVC-Pre L 4.43  FVC-Predicted Pre % 102  FVC-Post L 4.38  FVC-Predicted Post % 101  Pre FEV1/FVC % % 74  Post FEV1/FCV % % 79  FEV1-Pre L 3.29  FEV1-Predicted Pre % 99  FEV1-Post L 3.48  DLCO uncorrected ml/min/mmHg 24.67  DLCO UNC% % 67  DLCO corrected ml/min/mmHg 24.67  DLCO COR %Predicted % 67  DLVA Predicted % 71  TLC L 7.51  TLC % Predicted % 98  RV % Predicted % 112    FeNO: no  Pathology: no  Echocardiogram: no  Heart Catheterization: no    Assessment & Plan:     ICD-10-CM   1. Nodule of upper lobe of right lung  R91.1 NM PET Image Initial (PI) Skull Base To Thigh (F-18 FDG)    Ambulatory referral to Pulmonology    Procedural/ Surgical Case Request: VIDEO BRONCHOSCOPY WITH ENDOBRONCHIAL NAVIGATION    2. Ground glass opacity present on imaging of lung  R91.8     3. Former smoker  Z87.891     4. Centrilobular emphysema ([place])  J43.2     5. Calcified pleural plaque due to asbestos exposure  J92.0       Discussion: This is a 75 year old former smoker, quit 5 years ago, had an incidentally found right upper lobe subsolid groundglass lesion concerning for an indolent carcinoma.  Additionally has a right lower lobe rounded somewhat bilobed nodule adjacent to pleural plaque.  Plan: That he needs to start with a PET scan. I like to see if the nodule that is subpleural in  the lower lobe has any PET uptake. The upper lobe lesion I do believe is a stage I malignancy. We may be dealing with metachronous primaries in this case. The lower lobe lesion if there is no significant metabolic uptake may consider observation versus biopsy.  It is in a relatively high risk location due to its  subpleural nature.  Today in the office we discussed the risk benefits and alternatives of proceeding with robotic bronchoscopy.  Patient is agreeable to proceed. We will have a PET scan completed before case is done to ensure appropriate planning. We will try to convert his most recent CT scan to a super D format to be used for navigation. Tentative bronchoscopy day on 10/26/2020.  Additional time spent today with PCC's regarding scheduling of appropriate imaging and procedure.    Current Outpatient Medications:    amLODipine (NORVASC) 5 MG tablet, Take 5 mg by mouth daily., Disp: , Rfl:    cetirizine (ZYRTEC) 10 MG tablet, Take 10 mg by mouth daily., Disp: , Rfl:    citalopram (CELEXA) 20 MG tablet, Take 20 mg by mouth daily., Disp: , Rfl:    dextromethorphan-guaiFENesin (MUCINEX DM) 30-600 MG 12hr tablet, Take 1 tablet by mouth 2 (two) times daily., Disp: , Rfl:    fluticasone (FLONASE) 50 MCG/ACT nasal spray, Place 1 spray into both nostrils daily., Disp: , Rfl:    HYDROcodone-acetaminophen (NORCO/VICODIN) 5-325 MG tablet, Take by mouth., Disp: , Rfl:    losartan-hydrochlorothiazide (HYZAAR) 100-25 MG per tablet, Take 1 tablet by mouth daily. , Disp: , Rfl:    montelukast (SINGULAIR) 10 MG tablet, Take 10 mg by mouth at bedtime., Disp: , Rfl:    traZODone (DESYREL) 50 MG tablet, Take 50 mg by mouth at bedtime., Disp: , Rfl:   I spent 62 minutes dedicated to the care of this patient on the date of this encounter to include pre-visit review of records, face-to-face time with the patient discussing conditions above, post visit ordering of testing, clinical documentation with the electronic health record, making appropriate referrals as documented, and communicating necessary findings to members of the patients care team.   Garner Nash, DO [facility] 09/29/2020 11:35 AM

## 2020-10-04 DIAGNOSIS — M65342 Trigger finger, left ring finger: Secondary | ICD-10-CM | POA: Diagnosis not present

## 2020-10-04 DIAGNOSIS — M65332 Trigger finger, left middle finger: Secondary | ICD-10-CM | POA: Diagnosis not present

## 2020-10-12 ENCOUNTER — Other Ambulatory Visit: Payer: Self-pay

## 2020-10-12 ENCOUNTER — Ambulatory Visit (HOSPITAL_COMMUNITY)
Admission: RE | Admit: 2020-10-12 | Discharge: 2020-10-12 | Disposition: A | Discharge status: Home / Self Care | Payer: Medicare HMO | Source: Ambulatory Visit | Attending: Pulmonary Disease | Admitting: Pulmonary Disease

## 2020-10-12 DIAGNOSIS — J929 Pleural plaque without asbestos: Secondary | ICD-10-CM | POA: Diagnosis not present

## 2020-10-12 DIAGNOSIS — N401 Enlarged prostate with lower urinary tract symptoms: Secondary | ICD-10-CM | POA: Diagnosis not present

## 2020-10-12 DIAGNOSIS — Z79899 Other long term (current) drug therapy: Secondary | ICD-10-CM | POA: Diagnosis not present

## 2020-10-12 DIAGNOSIS — I700 Atherosclerosis of aorta: Secondary | ICD-10-CM | POA: Diagnosis not present

## 2020-10-12 DIAGNOSIS — J432 Centrilobular emphysema: Secondary | ICD-10-CM | POA: Diagnosis not present

## 2020-10-12 DIAGNOSIS — Z0000 Encounter for general adult medical examination without abnormal findings: Secondary | ICD-10-CM | POA: Diagnosis not present

## 2020-10-12 DIAGNOSIS — G8929 Other chronic pain: Secondary | ICD-10-CM | POA: Diagnosis not present

## 2020-10-12 DIAGNOSIS — J439 Emphysema, unspecified: Secondary | ICD-10-CM | POA: Insufficient documentation

## 2020-10-12 DIAGNOSIS — I10 Essential (primary) hypertension: Secondary | ICD-10-CM | POA: Diagnosis not present

## 2020-10-12 DIAGNOSIS — R911 Solitary pulmonary nodule: Secondary | ICD-10-CM | POA: Insufficient documentation

## 2020-10-12 DIAGNOSIS — F324 Major depressive disorder, single episode, in partial remission: Secondary | ICD-10-CM | POA: Diagnosis not present

## 2020-10-12 DIAGNOSIS — G4700 Insomnia, unspecified: Secondary | ICD-10-CM | POA: Diagnosis not present

## 2020-10-12 DIAGNOSIS — I77810 Thoracic aortic ectasia: Secondary | ICD-10-CM | POA: Diagnosis not present

## 2020-10-12 DIAGNOSIS — N281 Cyst of kidney, acquired: Secondary | ICD-10-CM | POA: Diagnosis not present

## 2020-10-12 DIAGNOSIS — H9190 Unspecified hearing loss, unspecified ear: Secondary | ICD-10-CM | POA: Diagnosis not present

## 2020-10-12 DIAGNOSIS — I712 Thoracic aortic aneurysm, without rupture: Secondary | ICD-10-CM | POA: Diagnosis not present

## 2020-10-12 LAB — GLUCOSE, CAPILLARY: Glucose-Capillary: 104 mg/dL — ABNORMAL HIGH (ref 70–99)

## 2020-10-12 MED ORDER — FLUDEOXYGLUCOSE F - 18 (FDG) INJECTION
11.3000 | Freq: Once | INTRAVENOUS | Status: AC
  Administered 2020-10-12: 11.23 via INTRAVENOUS

## 2020-10-20 ENCOUNTER — Other Ambulatory Visit: Payer: Self-pay

## 2020-10-20 ENCOUNTER — Encounter (HOSPITAL_COMMUNITY): Payer: Self-pay | Admitting: Pulmonary Disease

## 2020-10-20 NOTE — Progress Notes (Signed)
Spoke with pt pre-op instructions given.

## 2020-10-22 ENCOUNTER — Other Ambulatory Visit: Payer: Self-pay | Admitting: Pulmonary Disease

## 2020-10-23 LAB — SARS CORONAVIRUS 2 (TAT 6-24 HRS): SARS Coronavirus 2: NEGATIVE

## 2020-10-26 ENCOUNTER — Encounter (HOSPITAL_COMMUNITY)
Admission: RE | Disposition: A | Discharge status: Home / Self Care | Payer: Self-pay | Source: Home / Self Care | Attending: Pulmonary Disease

## 2020-10-26 ENCOUNTER — Ambulatory Visit (HOSPITAL_COMMUNITY): Payer: Medicare HMO

## 2020-10-26 ENCOUNTER — Other Ambulatory Visit: Payer: Self-pay

## 2020-10-26 ENCOUNTER — Encounter (HOSPITAL_COMMUNITY): Payer: Self-pay | Admitting: Pulmonary Disease

## 2020-10-26 ENCOUNTER — Ambulatory Visit (HOSPITAL_COMMUNITY): Payer: Medicare HMO | Admitting: Anesthesiology

## 2020-10-26 ENCOUNTER — Ambulatory Visit (HOSPITAL_COMMUNITY)
Admission: RE | Admit: 2020-10-26 | Discharge: 2020-10-26 | Disposition: A | Discharge status: Home / Self Care | Payer: Medicare HMO | Attending: Pulmonary Disease | Admitting: Pulmonary Disease

## 2020-10-26 DIAGNOSIS — Z419 Encounter for procedure for purposes other than remedying health state, unspecified: Secondary | ICD-10-CM

## 2020-10-26 DIAGNOSIS — I119 Hypertensive heart disease without heart failure: Secondary | ICD-10-CM | POA: Diagnosis not present

## 2020-10-26 DIAGNOSIS — R918 Other nonspecific abnormal finding of lung field: Secondary | ICD-10-CM | POA: Diagnosis not present

## 2020-10-26 DIAGNOSIS — J432 Centrilobular emphysema: Secondary | ICD-10-CM | POA: Diagnosis not present

## 2020-10-26 DIAGNOSIS — I10 Essential (primary) hypertension: Secondary | ICD-10-CM | POA: Diagnosis not present

## 2020-10-26 DIAGNOSIS — Z79899 Other long term (current) drug therapy: Secondary | ICD-10-CM | POA: Diagnosis not present

## 2020-10-26 DIAGNOSIS — Z9889 Other specified postprocedural states: Secondary | ICD-10-CM

## 2020-10-26 DIAGNOSIS — Z79891 Long term (current) use of opiate analgesic: Secondary | ICD-10-CM | POA: Diagnosis not present

## 2020-10-26 DIAGNOSIS — J449 Chronic obstructive pulmonary disease, unspecified: Secondary | ICD-10-CM | POA: Diagnosis not present

## 2020-10-26 DIAGNOSIS — Z87891 Personal history of nicotine dependence: Secondary | ICD-10-CM | POA: Diagnosis not present

## 2020-10-26 DIAGNOSIS — J920 Pleural plaque with presence of asbestos: Secondary | ICD-10-CM | POA: Diagnosis not present

## 2020-10-26 DIAGNOSIS — I517 Cardiomegaly: Secondary | ICD-10-CM | POA: Diagnosis not present

## 2020-10-26 DIAGNOSIS — R911 Solitary pulmonary nodule: Secondary | ICD-10-CM | POA: Diagnosis not present

## 2020-10-26 DIAGNOSIS — K219 Gastro-esophageal reflux disease without esophagitis: Secondary | ICD-10-CM | POA: Insufficient documentation

## 2020-10-26 HISTORY — PX: BRONCHIAL WASHINGS: SHX5105

## 2020-10-26 HISTORY — PX: BRONCHIAL NEEDLE ASPIRATION BIOPSY: SHX5106

## 2020-10-26 HISTORY — PX: VIDEO BRONCHOSCOPY WITH ENDOBRONCHIAL NAVIGATION: SHX6175

## 2020-10-26 HISTORY — PX: BRONCHIAL BRUSHINGS: SHX5108

## 2020-10-26 HISTORY — PX: FIDUCIAL MARKER PLACEMENT: SHX6858

## 2020-10-26 HISTORY — DX: Dyspnea, unspecified: R06.00

## 2020-10-26 HISTORY — PX: BRONCHIAL BIOPSY: SHX5109

## 2020-10-26 HISTORY — PX: VIDEO BRONCHOSCOPY WITH RADIAL ENDOBRONCHIAL ULTRASOUND: SHX6849

## 2020-10-26 LAB — CBC
HCT: 43.6 % (ref 39.0–52.0)
Hemoglobin: 15.2 g/dL (ref 13.0–17.0)
MCH: 31.3 pg (ref 26.0–34.0)
MCHC: 34.9 g/dL (ref 30.0–36.0)
MCV: 89.9 fL (ref 80.0–100.0)
Platelets: 313 10*3/uL (ref 150–400)
RBC: 4.85 MIL/uL (ref 4.22–5.81)
RDW: 14.2 % (ref 11.5–15.5)
WBC: 4.7 10*3/uL (ref 4.0–10.5)
nRBC: 0 % (ref 0.0–0.2)

## 2020-10-26 LAB — BASIC METABOLIC PANEL
Anion gap: 12 (ref 5–15)
BUN: 10 mg/dL (ref 8–23)
CO2: 21 mmol/L — ABNORMAL LOW (ref 22–32)
Calcium: 9.4 mg/dL (ref 8.9–10.3)
Chloride: 103 mmol/L (ref 98–111)
Creatinine, Ser: 1.23 mg/dL (ref 0.61–1.24)
GFR, Estimated: >60 mL/min (ref >60)
Glucose, Bld: 96 mg/dL (ref 70–99)
Potassium: 3.3 mmol/L — ABNORMAL LOW (ref 3.5–5.1)
Sodium: 136 mmol/L (ref 135–145)

## 2020-10-26 SURGERY — VIDEO BRONCHOSCOPY WITH ENDOBRONCHIAL NAVIGATION
Anesthesia: General

## 2020-10-26 MED ORDER — ACETAMINOPHEN 325 MG PO TABS: 325.0000 mg | ORAL_TABLET | ORAL | Status: DC | PRN

## 2020-10-26 MED ORDER — OXYCODONE HCL 5 MG PO TABS: 5.0000 mg | ORAL_TABLET | Freq: Once | ORAL | Status: DC | PRN

## 2020-10-26 MED ORDER — ONDANSETRON HCL 4 MG/2ML IJ SOLN
INTRAMUSCULAR | Status: DC | PRN
  Administered 2020-10-26: 4 mg via INTRAVENOUS

## 2020-10-26 MED ORDER — LIDOCAINE 2% (20 MG/ML) 5 ML SYRINGE
INTRAMUSCULAR | Status: DC | PRN
Start: 2020-10-26 — End: 2020-10-26
  Administered 2020-10-26: 100 mg via INTRAVENOUS

## 2020-10-26 MED ORDER — ROCURONIUM BROMIDE 10 MG/ML (PF) SYRINGE
PREFILLED_SYRINGE | INTRAVENOUS | Status: DC | PRN
  Administered 2020-10-26: 50 mg via INTRAVENOUS

## 2020-10-26 MED ORDER — SUGAMMADEX SODIUM 200 MG/2ML IV SOLN
INTRAVENOUS | Status: DC | PRN
  Administered 2020-10-26: 300 mg via INTRAVENOUS

## 2020-10-26 MED ORDER — ONDANSETRON HCL 4 MG/2ML IJ SOLN: 4.0000 mg | Freq: Once | INTRAMUSCULAR | Status: DC | PRN

## 2020-10-26 MED ORDER — ACETAMINOPHEN 160 MG/5ML PO SOLN: 325.0000 mg | ORAL | Status: DC | PRN

## 2020-10-26 MED ORDER — OXYCODONE HCL 5 MG/5ML PO SOLN: 5.0000 mg | Freq: Once | ORAL | Status: DC | PRN

## 2020-10-26 MED ORDER — SODIUM CHLORIDE 0.9 % IV SOLN: INTRAVENOUS | Status: DC

## 2020-10-26 MED ORDER — FENTANYL CITRATE (PF) 100 MCG/2ML IJ SOLN: 25.0000 ug | INTRAMUSCULAR | Status: DC | PRN

## 2020-10-26 MED ORDER — FENTANYL CITRATE (PF) 250 MCG/5ML IJ SOLN
INTRAMUSCULAR | Status: DC | PRN
  Administered 2020-10-26: 100 ug via INTRAVENOUS

## 2020-10-26 MED ORDER — CHLORHEXIDINE GLUCONATE 0.12 % MT SOLN
OROMUCOSAL | Status: AC
  Administered 2020-10-26: 15 mL via OROMUCOSAL
  Filled 2020-10-26: qty 15

## 2020-10-26 MED ORDER — CHLORHEXIDINE GLUCONATE 0.12 % MT SOLN: 15.0000 mL | Freq: Once | OROMUCOSAL | Status: AC

## 2020-10-26 MED ORDER — PHENYLEPHRINE HCL-NACL 20-0.9 MG/250ML-% IV SOLN
INTRAVENOUS | Status: DC | PRN
  Administered 2020-10-26: 20 ug/min via INTRAVENOUS

## 2020-10-26 MED ORDER — PROPOFOL 10 MG/ML IV BOLUS
INTRAVENOUS | Status: DC | PRN
  Administered 2020-10-26: 150 mg via INTRAVENOUS

## 2020-10-26 MED ORDER — DEXAMETHASONE SODIUM PHOSPHATE 10 MG/ML IJ SOLN
INTRAMUSCULAR | Status: DC | PRN
  Administered 2020-10-26: 10 mg via INTRAVENOUS

## 2020-10-26 MED ORDER — MEPERIDINE HCL 100 MG/ML IJ SOLN: 6.2500 mg | INTRAMUSCULAR | Status: DC | PRN

## 2020-10-26 SURGICAL SUPPLY — 45 items
ADAPTER BRONCH F/PENTAX (ADAPTER) ×3 IMPLANT
ADAPTER VALVE BIOPSY EBUS (MISCELLANEOUS) IMPLANT
ADPTR VALVE BIOPSY EBUS (MISCELLANEOUS)
BRUSH CYTOL CELLEBRITY 1.5X140 (MISCELLANEOUS) ×3 IMPLANT
BRUSH SUPERTRAX BIOPSY (INSTRUMENTS) IMPLANT
BRUSH SUPERTRAX NDL-TIP CYTO (INSTRUMENTS) ×3 IMPLANT
CANISTER SUCT 3000ML PPV (MISCELLANEOUS) ×3 IMPLANT
CHANNEL WORK EXTEND EDGE 180 (KITS) IMPLANT
CHANNEL WORK EXTEND EDGE 45 (KITS) IMPLANT
CHANNEL WORK EXTEND EDGE 90 (KITS) IMPLANT
CONT SPEC 4OZ CLIKSEAL STRL BL (MISCELLANEOUS) ×3 IMPLANT
COVER BACK TABLE 60X90IN (DRAPES) ×3 IMPLANT
FILTER STRAW FLUID ASPIR (MISCELLANEOUS) IMPLANT
FORCEPS BIOP SUPERTRX PREMAR (INSTRUMENTS) ×3 IMPLANT
GAUZE SPONGE 4X4 12PLY STRL (GAUZE/BANDAGES/DRESSINGS) ×3 IMPLANT
GLOVE SURG SS PI 7.5 STRL IVOR (GLOVE) ×6 IMPLANT
GOWN STRL REUS W/ TWL LRG LVL3 (GOWN DISPOSABLE) ×4 IMPLANT
GOWN STRL REUS W/TWL LRG LVL3 (GOWN DISPOSABLE) ×2
KIT CLEAN ENDO COMPLIANCE (KITS) ×3 IMPLANT
KIT LOCATABLE GUIDE (CANNULA) IMPLANT
KIT MARKER FIDUCIAL DELIVERY (KITS) IMPLANT
KIT PROCEDURE EDGE 180 (KITS) IMPLANT
KIT PROCEDURE EDGE 45 (KITS) IMPLANT
KIT PROCEDURE EDGE 90 (KITS) IMPLANT
KIT TURNOVER KIT B (KITS) ×3 IMPLANT
MARKER SKIN DUAL TIP RULER LAB (MISCELLANEOUS) ×3 IMPLANT
NEEDLE SUPERTRX PREMARK BIOPSY (NEEDLE) ×3 IMPLANT
NS IRRIG 1000ML POUR BTL (IV SOLUTION) ×3 IMPLANT
OIL SILICONE PENTAX (PARTS (SERVICE/REPAIRS)) ×3 IMPLANT
PAD ARMBOARD 7.5X6 YLW CONV (MISCELLANEOUS) ×6 IMPLANT
PATCHES PATIENT (LABEL) ×9 IMPLANT
SOL ANTI FOG 6CC (MISCELLANEOUS) ×2 IMPLANT
SOLUTION ANTI FOG 6CC (MISCELLANEOUS) ×1
SYR 20CC LL (SYRINGE) ×3 IMPLANT
SYR 20ML ECCENTRIC (SYRINGE) ×3 IMPLANT
SYR 50ML SLIP (SYRINGE) ×3 IMPLANT
TOWEL OR 17X24 6PK STRL BLUE (TOWEL DISPOSABLE) ×3 IMPLANT
TRAP SPECIMEN MUCOUS 40CC (MISCELLANEOUS) IMPLANT
TUBE CONNECTING 20X1/4 (TUBING) ×3 IMPLANT
UNDERPAD 30X30 (UNDERPADS AND DIAPERS) ×3 IMPLANT
VALVE BIOPSY  SINGLE USE (MISCELLANEOUS) ×1
VALVE BIOPSY SINGLE USE (MISCELLANEOUS) ×2 IMPLANT
VALVE SUCTION BRONCHIO DISP (MISCELLANEOUS) ×3 IMPLANT
WATER STERILE IRR 1000ML POUR (IV SOLUTION) ×3 IMPLANT
fiducial marker ×3 IMPLANT

## 2020-10-26 NOTE — Anesthesia Preprocedure Evaluation (Addendum)
Anesthesia Evaluation  Patient identified by MRN, date of birth, ID band Patient awake    Reviewed: Allergy & Precautions, NPO status , Patient's Chart, lab work & pertinent test results  History of Anesthesia Complications Negative for: history of anesthetic complications  Airway Mallampati: II  TM Distance: >3 FB Neck ROM: Full    Dental  (+) Edentulous Upper, Edentulous Lower, Dental Advisory Given   Pulmonary COPD,  COPD inhaler, former smoker,    Pulmonary exam normal        Cardiovascular hypertension, Pt. on medications Normal cardiovascular exam   '21 CT Chest - Stable mild aneurysmal dilatation of the ascending thoracic aorta measuring approximately 4.1 cm in AP diameter. Aortic root measures 3.2 cm as the sinotubular junction measures 2.9 cm. Mid aortic arch measures 2.9 cm and proximal descending thoracic aorta is not significantly changed measuring 3.6 cm in transverse diameter. Distal descending thoracic aorta measures 2.9 cm. Remaining vascular structures are unremarkable.    Neuro/Psych PSYCHIATRIC DISORDERS Depression  Neuromuscular disease (Lumbar radiculopathy)    GI/Hepatic Neg liver ROS, GERD  Medicated and Controlled,  Endo/Other   Hypokalemia   Renal/GU Renal disease     Musculoskeletal negative musculoskeletal ROS (+)   Abdominal   Peds  Hematology negative hematology ROS (+)   Anesthesia Other Findings     Reproductive/Obstetrics                           Anesthesia Physical  Anesthesia Plan  ASA: 3  Anesthesia Plan: General   Post-op Pain Management:    Induction: Intravenous  PONV Risk Score and Plan: 3 and Treatment may vary due to age or medical condition, Ondansetron and Dexamethasone  Airway Management Planned: Oral ETT  Additional Equipment: None  Intra-op Plan:   Post-operative Plan: Extubation in OR  Informed Consent: I have reviewed the  patients History and Physical, chart, labs and discussed the procedure including the risks, benefits and alternatives for the proposed anesthesia with the patient or authorized representative who has indicated his/her understanding and acceptance.     Dental advisory given  Plan Discussed with: CRNA and Anesthesiologist  Anesthesia Plan Comments:         Anesthesia Quick Evaluation

## 2020-10-26 NOTE — Anesthesia Postprocedure Evaluation (Signed)
Anesthesia Post Note  Patient: Lee Leblanc  Procedure(s) Performed: VIDEO BRONCHOSCOPY WITH ENDOBRONCHIAL NAVIGATION BRONCHIAL BIOPSIES BRONCHIAL BRUSHINGS BRONCHIAL NEEDLE ASPIRATION BIOPSIES BRONCHIAL WASHINGS VIDEO BRONCHOSCOPY WITH RADIAL ENDOBRONCHIAL ULTRASOUND FIDUCIAL MARKER PLACEMENT     Patient location during evaluation: PACU Anesthesia Type: General Level of consciousness: sedated and patient cooperative Pain management: pain level controlled Vital Signs Assessment: post-procedure vital signs reviewed and stable Respiratory status: spontaneous breathing Cardiovascular status: stable Anesthetic complications: no   No notable events documented.  Last Vitals:  Vitals:   10/26/20 1205 10/26/20 1235  BP: 112/69 108/69  Pulse: 62 63  Resp: 12 12  Temp:    SpO2: 98% 96%    Last Pain:  Vitals:   10/26/20 1235  TempSrc:   PainSc: 0-No pain                 Nolon Nations

## 2020-10-26 NOTE — Discharge Instructions (Signed)
Flexible Bronchoscopy, Care After This sheet gives you information about how to care for yourself after your test. Your doctor may also give you more specific instructions. If you have problems or questions, contact your doctor. Follow these instructions at home: Eating and drinking Do not eat or drink anything (not even water) for 2 hours after your test, or until your numbing medicine (local anesthetic) wears off. When your numbness is gone and your cough and gag reflexes have come back, you may: Eat only soft foods. Slowly drink liquids. The day after the test, go back to your normal diet. Driving Do not drive for 24 hours if you were given a medicine to help you relax (sedative). Do not drive or use heavy machinery while taking prescription pain medicine. General instructions  Take over-the-counter and prescription medicines only as told by your doctor. Return to your normal activities as told. Ask what activities are safe for you. Do not use any products that have nicotine or tobacco in them. This includes cigarettes and e-cigarettes. If you need help quitting, ask your doctor. Keep all follow-up visits as told by your doctor. This is important. It is very important if you had a tissue sample (biopsy) taken. Get help right away if: You have shortness of breath that gets worse. You get light-headed. You feel like you are going to pass out (faint). You have chest pain. You cough up: More than a little blood. More blood than before. Summary Do not eat or drink anything (not even water) for 2 hours after your test, or until your numbing medicine wears off. Do not use cigarettes. Do not use e-cigarettes. Get help right away if you have chest pain.  This information is not intended to replace advice given to you by your health care provider. Make sure you discuss any questions you have with your health care provider. Document Released: 11/20/2008 Document Revised: 01/05/2017 Document  Reviewed: 02/11/2016 Elsevier Patient Education  2020 [employer].

## 2020-10-26 NOTE — Interval H&P Note (Signed)
History and Physical Interval Note:  10/26/2020 9:03 AM  Lee Leblanc  has presented today for surgery, with the diagnosis of lung nodules.  The various methods of treatment have been discussed with the patient and family. After consideration of risks, benefits and other options for treatment, the patient has consented to  Procedure(s) with comments: [place] (N/A) - ION w/ fiducial placement as a surgical intervention.  The patient's history has been reviewed, patient examined, no change in status, stable for surgery.  I have reviewed the patient's chart and labs.  Questions were answered to the patient's satisfaction.     [place]

## 2020-10-26 NOTE — Anesthesia Procedure Notes (Addendum)
Procedure Name: Intubation Date/Time: 10/26/2020 10:38 AM Performed by: Jenne Campus, CRNA Pre-anesthesia Checklist: Patient identified, Emergency Drugs available, Suction available and Patient being monitored Patient Re-evaluated:Patient Re-evaluated prior to induction Oxygen Delivery Method: Circle System Utilized Preoxygenation: Pre-oxygenation with 100% oxygen Induction Type: IV induction Ventilation: Mask ventilation without difficulty Laryngoscope Size: Miller and 3 Grade View: Grade I Tube type: Oral Tube size: 8.5 mm Number of attempts: 1 Airway Equipment and Method: Stylet and Oral airway Placement Confirmation: ETT inserted through vocal cords under direct vision, positive ETCO2 and breath sounds checked- equal and bilateral Secured at: 23 cm Tube secured with: Tape Dental Injury: Teeth and Oropharynx as per pre-operative assessment

## 2020-10-26 NOTE — Transfer of Care (Signed)
Immediate Anesthesia Transfer of Care Note  Patient: Lee Leblanc  Procedure(s) Performed: VIDEO BRONCHOSCOPY WITH ENDOBRONCHIAL NAVIGATION BRONCHIAL BIOPSIES BRONCHIAL BRUSHINGS BRONCHIAL NEEDLE ASPIRATION BIOPSIES BRONCHIAL WASHINGS VIDEO BRONCHOSCOPY WITH RADIAL ENDOBRONCHIAL ULTRASOUND FIDUCIAL MARKER PLACEMENT  Patient Location: PACU  Anesthesia Type:General  Level of Consciousness: awake, oriented and patient cooperative  Airway & Oxygen Therapy: Patient Spontanous Breathing and Patient connected to nasal cannula oxygen  Post-op Assessment: Report given to RN and Post -op Vital signs reviewed and stable  Post vital signs: Reviewed  Last Vitals:  Vitals Value Taken Time  BP 116/82 10/26/20 1136  Temp    Pulse 64 10/26/20 1138  Resp 8 10/26/20 1138  SpO2 98 % 10/26/20 1138  Vitals shown include unvalidated device data.  Last Pain:  Vitals:   10/26/20 0825  TempSrc:   PainSc: 0-No pain         Complications: No notable events documented.

## 2020-10-26 NOTE — Op Note (Signed)
Video Bronchoscopy with Robotic Assisted Bronchoscopic Navigation   Date of Operation: 10/26/2020   Pre-op Diagnosis: Right upper lobe lung nodule  Post-op Diagnosis: Right upper lobe lung nodule  Surgeon: Garner Nash, DO   Assistants: None   Anesthesia: General endotracheal anesthesia  Operation: Flexible video fiberoptic bronchoscopy with robotic assistance and biopsies.  Estimated Blood Loss: Minimal  Complications: None  Indications and History: Lee Leblanc is a 75 y.o. male with history of tobacco abuse, right upper lobe lung nodule. The risks, benefits, complications, treatment options and expected outcomes were discussed with the patient.  The possibilities of pneumothorax, pneumonia, reaction to medication, pulmonary aspiration, perforation of a viscus, bleeding, failure to diagnose a condition and creating a complication requiring transfusion or operation were discussed with the patient who freely signed the consent.    Description of Procedure: The patient was seen in the Preoperative Area, was examined and was deemed appropriate to proceed.  The patient was taken to [facility] endoscopy room 3, identified as Lee Leblanc and the procedure verified as Flexible Video Fiberoptic Bronchoscopy.  A Time Out was held and the above information confirmed.   Prior to the date of the procedure a high-resolution CT scan of the chest was performed. Utilizing ION software program a virtual tracheobronchial tree was generated to allow the creation of distinct navigation pathways to the patient's parenchymal abnormalities. After being taken to the operating room general anesthesia was initiated and the patient  was orally intubated. The video fiberoptic bronchoscope was introduced via the endotracheal tube and a general inspection was performed which showed normal right and left lung anatomy, aspiration of the bilateral mainstems was completed to remove any remaining secretions. Robotic  catheter inserted into patient's endotracheal tube.   Target #1 right upper lobe lung nodule: The distinct navigation pathways prepared prior to this procedure were then utilized to navigate to patient's lesion identified on CT scan. The robotic catheter was secured into place and the vision probe was withdrawn.  Lesion location was approximated using fluoroscopy and radial endobronchial ultrasound for peripheral targeting. Under fluoroscopic guidance transbronchial needle brushings, transbronchial needle biopsies, and transbronchial forceps biopsies were performed to be sent for cytology and pathology.  Following tissue sampling a single fiducial was placed in proximity of the lesion under direct fluoroscopy with the fiducial catheter and wire delivery kit.  A bronchioalveolar lavage was performed in the right upper lobe and sent for cultures.  At the end of the procedure a general airway inspection was performed and there was no evidence of active bleeding. The bronchoscope was removed.  The patient tolerated the procedure well. There was no significant blood loss and there were no obvious complications. A post-procedural chest x-ray is pending.  Samples Target #1: 1. Transbronchial needle brushings from RUL 2. Transbronchial Wang needle biopsies from RUL 3. Transbronchial forceps biopsies from RUL 4. Bronchoalveolar lavage from RUL  Plans:  The patient will be discharged from the PACU to home when recovered from anesthesia and after chest x-ray is reviewed. We will review the cytology, pathology and microbiology results with the patient when they become available. Outpatient followup will be with Garner Nash, [place] Reighlynn Swiney, DO [facility] 10/26/2020 11:28 AM

## 2020-10-27 ENCOUNTER — Encounter (HOSPITAL_COMMUNITY): Payer: Self-pay | Admitting: Pulmonary Disease

## 2020-10-27 LAB — ACID FAST SMEAR (AFB, MYCOBACTERIA): Acid Fast Smear: NEGATIVE

## 2020-10-28 LAB — CYTOLOGY - NON PAP

## 2020-10-28 LAB — CULTURE, BAL-QUANTITATIVE W GRAM STAIN: Culture: NO GROWTH

## 2020-10-31 LAB — ANAEROBIC CULTURE W GRAM STAIN

## 2020-11-04 NOTE — Telephone Encounter (Signed)
Bronch performed on 10/26/20. Will close encounter.

## 2020-11-18 ENCOUNTER — Ambulatory Visit: Payer: Medicare HMO | Admitting: Pulmonary Disease

## 2020-11-25 LAB — FUNGUS CULTURE WITH STAIN

## 2020-11-25 LAB — FUNGUS CULTURE RESULT

## 2020-11-25 LAB — FUNGAL ORGANISM REFLEX

## 2020-11-26 ENCOUNTER — Encounter: Payer: Self-pay | Admitting: Pulmonary Disease

## 2020-11-26 ENCOUNTER — Ambulatory Visit: Payer: Medicare HMO | Admitting: Pulmonary Disease

## 2020-11-26 ENCOUNTER — Other Ambulatory Visit: Payer: Self-pay

## 2020-11-26 VITALS — BP 110/78 | HR 62 | Temp 98.1°F | Ht 74.0 in | Wt 219.2 lb

## 2020-11-26 DIAGNOSIS — R911 Solitary pulmonary nodule: Secondary | ICD-10-CM

## 2020-11-26 DIAGNOSIS — Z87891 Personal history of nicotine dependence: Secondary | ICD-10-CM

## 2020-11-26 DIAGNOSIS — J432 Centrilobular emphysema: Secondary | ICD-10-CM

## 2020-11-26 DIAGNOSIS — R918 Other nonspecific abnormal finding of lung field: Secondary | ICD-10-CM

## 2020-11-26 NOTE — Progress Notes (Signed)
I think  Synopsis: Referred in August 2022 for lung nodule by Seward Carol, MD  Subjective:   PATIENT ID: Lee Leblanc GENDER: male DOB: July 21, 1945, MRN: [id number]  Chief Complaint  Patient presents with   Follow-up    Follow up on biopsy     This is a 75 year old gentleman, COPD, GERD, hypertension.  Referred for abnormal CT imaging.  Seen in the cardiothoracic surgery office for aneurysm surveillance.  He is a 75 year old with a 50-pack-year history of smoking.  Previously followed by Dr. Lucianne Lei trite and seen again in follow-up by Dr. 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  Incidentally was found to have a right lung nodule.  Patient CT imaging was completed on 09/02/2020.  This revealed a progressive nonsolid nodule subsolid in nature for the posterior right upper lobe areas of groundglass suspicious for a low-grade indolent neoplasm.  OV 09/29/2020: Here today to review the CT imaging.  We discussed this today in the office.  Patient has no significant complaints at this time.  OV 11/26/2020: Here today for follow-up after bronchoscopy.  All tissue samples from the upper lobe lesion following bronchoscopy were all negative for malignancy.  We reviewed this today in the office.  Presents with today discussion was patient's wife.  Reviewed images.  I explained that I still felt like this lesion within the upper lobe is likely represent a malignancy.  We discussed various options to include repeat biopsy or even consideration for resection.   Past Medical History:  Diagnosis Date   COPD (chronic obstructive pulmonary disease) (HCC)    has not used albuterol inhaler for last 2 months   Depression    Dyspnea    GERD (gastroesophageal reflux disease)    Hypertension      Family History  Problem Relation Age of Onset   Allergies Daughter    Allergies Son    Leukemia Sister    Prostate cancer Father      Past Surgical History:  Procedure Laterality Date   BACK SURGERY    lasdt  1990's   lower x 7   BRONCHIAL BIOPSY  10/26/2020   Procedure: BRONCHIAL BIOPSIES;  Surgeon: Garner Nash, DO;  Location: [facility];  Service: Pulmonary;;   BRONCHIAL BRUSHINGS  10/26/2020   Procedure: BRONCHIAL BRUSHINGS;  Surgeon: Garner Nash, DO;  Location: [facility];  Service: Pulmonary;;   BRONCHIAL NEEDLE ASPIRATION BIOPSY  10/26/2020   Procedure: BRONCHIAL NEEDLE ASPIRATION BIOPSIES;  Surgeon: Garner Nash, DO;  Location: [facility];  Service: Pulmonary;;   BRONCHIAL WASHINGS  10/26/2020   Procedure: BRONCHIAL WASHINGS;  Surgeon: Garner Nash, DO;  Location: [facility];  Service: Pulmonary;;   COLONOSCOPY WITH PROPOFOL N/A 06/25/2012   Procedure: COLONOSCOPY WITH PROPOFOL;  Surgeon: Garlan Fair, MD;  Location: [facility];  Service: Endoscopy;  Laterality: N/A;   ESOPHAGOGASTRODUODENOSCOPY (EGD) WITH PROPOFOL N/A 06/25/2012   Procedure: ESOPHAGOGASTRODUODENOSCOPY (EGD) WITH PROPOFOL;  Surgeon: Garlan Fair, MD;  Location: [facility];  Service: Endoscopy;  Laterality: N/A;   EYE SURGERY Right    FIDUCIAL MARKER PLACEMENT  10/26/2020   Procedure: FIDUCIAL MARKER PLACEMENT;  Surgeon: Garner Nash, DO;  Location: [facility];  Service: Pulmonary;;   SHOULDER ARTHROSCOPY Right    VIDEO BRONCHOSCOPY WITH ENDOBRONCHIAL NAVIGATION N/A 10/26/2020   Procedure: VIDEO BRONCHOSCOPY WITH ENDOBRONCHIAL NAVIGATION;  Surgeon: Garner Nash, DO;  Location: [facility];  Service: Pulmonary;  Laterality: N/A;  ION w/ fiducial placement   VIDEO BRONCHOSCOPY  WITH RADIAL ENDOBRONCHIAL ULTRASOUND  10/26/2020   Procedure: VIDEO BRONCHOSCOPY WITH RADIAL ENDOBRONCHIAL ULTRASOUND;  Surgeon: Garner Nash, DO;  Location: [facility];  Service: Pulmonary;;    Social History   Socioeconomic History   Marital status: Married    Spouse name: Not on file   Number of children: Not on file   Years of education: Not on file   Highest education level: Not on file   Occupational History   Occupation: Retired  Tobacco Use   Smoking status: Former    Packs/day: 0.50    Years: 50.00    Pack years: 25.00    Types: Cigarettes    Start date: 02/06/1961    Quit date: 02/07/2011    Years since quitting: 9.8   Smokeless tobacco: Never  Vaping Use   Vaping Use: Never used  Substance and Sexual Activity   Alcohol use: Yes    Alcohol/week: 0.0 standard drinks    Comment: occasional   Drug use: No   Sexual activity: Not on file  Other Topics Concern   Not on file  Social History Narrative   Not on file   Social Determinants of Health   Financial Resource Strain: Not on file  Food Insecurity: Not on file  Transportation Needs: Not on file  Physical Activity: Not on file  Stress: Not on file  Social Connections: Not on file  Intimate Partner Violence: Not on file     No Known Allergies   Outpatient Medications Prior to Visit  Medication Sig Dispense Refill   amLODipine (NORVASC) 5 MG tablet Take 5 mg by mouth daily.     cetirizine (ZYRTEC) 10 MG tablet Take 10 mg by mouth daily.     citalopram (CELEXA) 20 MG tablet Take 20 mg by mouth daily.     dextromethorphan-guaiFENesin (MUCINEX DM) 30-600 MG 12hr tablet Take 1 tablet by mouth 2 (two) times daily as needed for cough.     fluticasone (FLONASE) 50 MCG/ACT nasal spray Place 1 spray into both nostrils daily.     HYDROcodone-acetaminophen (NORCO/VICODIN) 5-325 MG tablet Take 1 tablet by mouth every 6 (six) hours as needed for moderate pain or severe pain.     losartan-hydrochlorothiazide (HYZAAR) 100-25 MG per tablet Take 1 tablet by mouth daily.      montelukast (SINGULAIR) 10 MG tablet Take 10 mg by mouth at bedtime.     pantoprazole (PROTONIX) 40 MG tablet Take 40 mg by mouth daily.     Propylene Glycol-Glycerin (SOOTHE) 0.6-0.6 % SOLN Place 1 drop into both eyes daily as needed (eye irritation).     traZODone (DESYREL) 50 MG tablet Take 50-100 mg by mouth at bedtime.     No  facility-administered medications prior to visit.    Review of Systems  Constitutional:  Negative for chills, fever, malaise/fatigue and weight loss.  HENT:  Negative for hearing loss, sore throat and tinnitus.   Eyes:  Negative for blurred vision and double vision.  Respiratory:  Negative for cough, hemoptysis, sputum production, shortness of breath, wheezing and stridor.   Cardiovascular:  Negative for chest pain, palpitations, orthopnea, leg swelling and PND.  Gastrointestinal:  Negative for abdominal pain, constipation, diarrhea, heartburn, nausea and vomiting.  Genitourinary:  Negative for dysuria, hematuria and urgency.  Musculoskeletal:  Negative for joint pain and myalgias.  Skin:  Negative for itching and rash.  Neurological:  Negative for dizziness, tingling, weakness and headaches.  Endo/Heme/Allergies:  Negative for environmental allergies. Does not bruise/bleed easily.  Psychiatric/Behavioral:  Negative for depression. The patient is not nervous/anxious and does not have insomnia.   All other systems reviewed and are negative.   Objective:  Physical Exam Vitals reviewed.  Constitutional:      General: He is not in acute distress.    Appearance: He is well-developed.  HENT:     Head: Normocephalic and atraumatic.  Eyes:     General: No scleral icterus.    Conjunctiva/sclera: Conjunctivae normal.     Pupils: Pupils are equal, round, and reactive to light.  Neck:     Vascular: No JVD.     Trachea: No tracheal deviation.  Cardiovascular:     Rate and Rhythm: Normal rate and regular rhythm.     Heart sounds: Normal heart sounds. No murmur heard. Pulmonary:     Effort: Pulmonary effort is normal. No tachypnea, accessory muscle usage or respiratory distress.     Breath sounds: No stridor. No wheezing, rhonchi or rales.  Abdominal:     General: Bowel sounds are normal. There is no distension.     Palpations: Abdomen is soft.     Tenderness: There is no abdominal  tenderness.  Musculoskeletal:        General: No tenderness.     Cervical back: Neck supple.  Lymphadenopathy:     Cervical: No cervical adenopathy.  Skin:    General: Skin is warm and dry.     Capillary Refill: Capillary refill takes less than 2 seconds.     Findings: No rash.  Neurological:     Mental Status: He is alert and oriented to person, place, and time.  Psychiatric:        Behavior: Behavior normal.     Vitals:   11/26/20 1444  BP: 110/78  Pulse: 62  Temp: 98.1 F (36.7 C)  TempSrc: Oral  SpO2: 99%  Weight: 219 lb 3.2 oz (99.4 kg)  Height: 6\' 2"  (1.88 m)   99% on RA BMI Readings from Last 3 Encounters:  11/26/20 28.14 kg/m  10/26/20 27.60 kg/m  09/29/20 28.07 kg/m   Wt Readings from Last 3 Encounters:  11/26/20 219 lb 3.2 oz (99.4 kg)  10/26/20 215 lb (97.5 kg)  09/29/20 218 lb 9.6 oz (99.2 kg)     CBC    Component Value Date/Time   WBC 4.7 10/26/2020 0815   RBC 4.85 10/26/2020 0815   HGB 15.2 10/26/2020 0815   HCT 43.6 10/26/2020 0815   PLT 313 10/26/2020 0815   MCV 89.9 10/26/2020 0815   MCH 31.3 10/26/2020 0815   MCHC 34.9 10/26/2020 0815   RDW 14.2 10/26/2020 0815    Chest Imaging:  09/02/2020 CT chest: Right upper lobe posterior groundglass lesion concerning for an indolent low-grade neoplasm. The patient's images have been independently reviewed by me.    Pulmonary Functions Testing Results: PFT Results Latest Ref Rng & Units 06/13/2013  FVC-Pre L 4.43  FVC-Predicted Pre % 102  FVC-Post L 4.38  FVC-Predicted Post % 101  Pre FEV1/FVC % % 74  Post FEV1/FCV % % 79  FEV1-Pre L 3.29  FEV1-Predicted Pre % 99  FEV1-Post L 3.48  DLCO uncorrected ml/min/mmHg 24.67  DLCO UNC% % 67  DLCO corrected ml/min/mmHg 24.67  DLCO COR %Predicted % 67  DLVA Predicted % 71  TLC L 7.51  TLC % Predicted % 98  RV % Predicted % 112    FeNO: no  Pathology: no  Echocardiogram: no  Heart Catheterization: no    Assessment &  Plan:      ICD-10-CM   1. Nodule of upper lobe of right lung  R91.1 Pulmonary Function Test    Ambulatory referral to Cardiothoracic Surgery    2. Ground glass opacity present on imaging of lung  R91.8     3. Former smoker  Z87.891     4. 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  We reviewed this as well as his recent bronchoscopy results.  The bronchoscopy however was nondiagnostic.  I explained that biopsying these lesions for a positive tissue result sometimes are tricky and the fact that lesional material is only associated on the circumferential edge of some of these thickened locations.  I still have a high suspicion that we are dealing with a indolent carcinoma.  Plan: After having further discussion about neck steps.  Also with his wife there she encouraged him to at least consider discussing possibility of combined procedure with thoracic surgery. Significant amount of time spent discussing the pros and cons today in the office of neck steps with patient and patient's wife. I have placed a referral to cardiothoracic surgery. He needs pulmonary function test complete. We will get the scheduled early next week. We appreciate the help from thoracic surgery.   Current Outpatient Medications:    amLODipine (NORVASC) 5 MG tablet, Take 5 mg by mouth daily., Disp: , Rfl:    cetirizine (ZYRTEC) 10 MG tablet, Take 10 mg by mouth daily., Disp: , Rfl:    citalopram (CELEXA) 20 MG tablet, Take 20 mg by mouth daily., Disp: , Rfl:    dextromethorphan-guaiFENesin (MUCINEX DM) 30-600 MG 12hr tablet, Take 1 tablet by mouth 2 (two) times daily as needed for cough., Disp: , Rfl:    fluticasone (FLONASE) 50 MCG/ACT nasal spray, Place 1 spray into both nostrils daily., Disp: , Rfl:     HYDROcodone-acetaminophen (NORCO/VICODIN) 5-325 MG tablet, Take 1 tablet by mouth every 6 (six) hours as needed for moderate pain or severe pain., Disp: , Rfl:    losartan-hydrochlorothiazide (HYZAAR) 100-25 MG per tablet, Take 1 tablet by mouth daily. , Disp: , Rfl:    montelukast (SINGULAIR) 10 MG tablet, Take 10 mg by mouth at bedtime., Disp: , Rfl:    pantoprazole (PROTONIX) 40 MG tablet, Take 40 mg by mouth daily., Disp: , Rfl:    Propylene Glycol-Glycerin (SOOTHE) 0.6-0.6 % SOLN, Place 1 drop into both eyes daily as needed (eye irritation)., Disp: , Rfl:    traZODone (DESYREL) 50 MG tablet, Take 50-100 mg by mouth at bedtime., Disp: , Rfl:   I spent 31 minutes dedicated to the care of this patient on the date of this encounter to include pre-visit review of records, face-to-face time with the patient discussing conditions above, post visit ordering of testing, clinical documentation with the electronic health record, making appropriate referrals as documented, and communicating necessary findings to members of the patients care team.    Garner Nash, DO [facility] 11/26/2020 10:21 PM

## 2020-11-26 NOTE — Patient Instructions (Signed)
Thank you for visiting Dr. Valeta Harms at [facility]. Today we recommend the following:  Orders Placed This Encounter  Procedures   Ambulatory referral to Cardiothoracic Surgery   Pulmonary Function Test    Return in about 3 months (around 02/26/2021).    Please do your part to reduce the spread of COVID-19.

## 2020-11-29 ENCOUNTER — Ambulatory Visit (INDEPENDENT_AMBULATORY_CARE_PROVIDER_SITE_OTHER): Payer: Medicare HMO | Admitting: Pulmonary Disease

## 2020-11-29 ENCOUNTER — Other Ambulatory Visit: Payer: Self-pay

## 2020-11-29 DIAGNOSIS — R911 Solitary pulmonary nodule: Secondary | ICD-10-CM

## 2020-11-29 LAB — PULMONARY FUNCTION TEST
DL/VA % pred: 89 %
DL/VA: 3.49 ml/min/mmHg/L
DLCO cor % pred: 86 %
DLCO cor: 23.86 ml/min/mmHg
DLCO unc % pred: 82 %
DLCO unc: 22.63 ml/min/mmHg
FEF 25-75 Post: 4.78 L/sec
FEF 25-75 Pre: 2.03 L/sec
FEF2575-%Change-Post: 135 %
FEF2575-%Pred-Post: 186 %
FEF2575-%Pred-Pre: 79 %
FEV1-%Change-Post: 26 %
FEV1-%Pred-Post: 109 %
FEV1-%Pred-Pre: 86 %
FEV1-Post: 3.44 L
FEV1-Pre: 2.73 L
FEV1FVC-%Change-Post: 11 %
FEV1FVC-%Pred-Pre: 94 %
FEV6-%Change-Post: 13 %
FEV6-%Pred-Post: 108 %
FEV6-%Pred-Pre: 95 %
FEV6-Post: 4.34 L
FEV6-Pre: 3.83 L
FEV6FVC-%Pred-Post: 104 %
FEV6FVC-%Pred-Pre: 104 %
FVC-%Change-Post: 13 %
FVC-%Pred-Post: 103 %
FVC-%Pred-Pre: 91 %
FVC-Post: 4.34 L
FVC-Pre: 3.83 L
Post FEV1/FVC ratio: 79 %
Post FEV6/FVC ratio: 100 %
Pre FEV1/FVC ratio: 71 %
Pre FEV6/FVC Ratio: 100 %
RV % pred: 110 %
RV: 3 L
TLC % pred: 97 %
TLC: 7.48 L

## 2020-11-29 NOTE — Patient Instructions (Signed)
Full PFT performed today. °

## 2020-11-29 NOTE — Progress Notes (Signed)
Full PFT performed today. °

## 2020-12-09 LAB — ACID FAST CULTURE WITH REFLEXED SENSITIVITIES (MYCOBACTERIA): Acid Fast Culture: NEGATIVE

## 2020-12-09 NOTE — Progress Notes (Signed)
Lee Leblanc [address]       [place] [address]             [id number]                    Lee Leblanc [facility] Medical Record #[id number] Date of Birth: March 23, 1945  Referring: Garner Nash, DO Primary Care: Seward Carol, MD Primary Cardiologist: None  Chief Complaint:    Chief Complaint  Patient presents with   Lung Lesion    Surgical consult, Chest CT 09/02/20, PET Scan 10/12/20, Bronch 10/26/20, PFT's 11/29/20    History of Present Illness:    Lee Leblanc 75 y.o. male referred for surgical evaluation of a right upper lobe groundglass opacity.  This was found incidentally on cross-sectional imaging for surveillance of his ascending aortic aneurysm.  He has undergone a navigational bronchoscopy on 11/26/2020 which was nondiagnostic.  He does occasionally have some exertional dyspnea.  He also complains of some dysphagia but has a known history of reflux.  He recently underwent back surgery and has had some joint pain from this.  His weight has been stable.  He denies any fatigue.     Smoking Hx: Quit in 2013   Zubrod Score: At the time of surgery this patient's most appropriate activity status/level should be described as: [x]     0    Normal activity, no symptoms []     1    Restricted in physical strenuous activity but ambulatory, able to do out light work []     2    Ambulatory and capable of self care, unable to do work activities, up and about               >50 % of waking hours                              []     3    Only limited self care, in bed greater than 50% of waking hours []     4    Completely disabled, no self care, confined to bed or chair []     5    Moribund   Past Medical History:  Diagnosis Date   COPD (chronic obstructive pulmonary disease) ([facility])    has not used albuterol inhaler for last 2 months   Depression    Dyspnea    GERD (gastroesophageal reflux disease)    Hypertension     Past Surgical History:  Procedure  Laterality Date   BACK SURGERY    lasdt 1990's   lower x 7   BRONCHIAL BIOPSY  10/26/2020   Procedure: BRONCHIAL BIOPSIES;  Surgeon: Garner Nash, DO;  Location: [facility];  Service: Pulmonary;;   BRONCHIAL BRUSHINGS  10/26/2020   Procedure: BRONCHIAL BRUSHINGS;  Surgeon: Garner Nash, DO;  Location: [facility];  Service: Pulmonary;;   BRONCHIAL NEEDLE ASPIRATION BIOPSY  10/26/2020   Procedure: BRONCHIAL NEEDLE ASPIRATION BIOPSIES;  Surgeon: Garner Nash, DO;  Location: [facility];  Service: Pulmonary;;   BRONCHIAL WASHINGS  10/26/2020   Procedure: BRONCHIAL WASHINGS;  Surgeon: Garner Nash, DO;  Location: [facility];  Service: Pulmonary;;   COLONOSCOPY WITH PROPOFOL N/A 06/25/2012   Procedure: COLONOSCOPY WITH PROPOFOL;  Surgeon: Garlan Fair, MD;  Location: [facility];  Service: Endoscopy;  Laterality: N/A;   ESOPHAGOGASTRODUODENOSCOPY (EGD) WITH PROPOFOL N/A 06/25/2012   Procedure: ESOPHAGOGASTRODUODENOSCOPY (EGD) WITH  PROPOFOL;  Surgeon: Garlan Fair, MD;  Location: [facility];  Service: Endoscopy;  Laterality: N/A;   EYE SURGERY Right    FIDUCIAL MARKER PLACEMENT  10/26/2020   Procedure: FIDUCIAL MARKER PLACEMENT;  Surgeon: Garner Nash, DO;  Location: [facility];  Service: Pulmonary;;   SHOULDER ARTHROSCOPY Right    VIDEO BRONCHOSCOPY WITH ENDOBRONCHIAL NAVIGATION N/A 10/26/2020   Procedure: VIDEO BRONCHOSCOPY WITH ENDOBRONCHIAL NAVIGATION;  Surgeon: Garner Nash, DO;  Location: [facility];  Service: Pulmonary;  Laterality: N/A;  ION w/ fiducial placement   VIDEO BRONCHOSCOPY WITH RADIAL ENDOBRONCHIAL ULTRASOUND  10/26/2020   Procedure: VIDEO BRONCHOSCOPY WITH RADIAL ENDOBRONCHIAL ULTRASOUND;  Surgeon: Garner Nash, DO;  Location: [facility];  Service: Pulmonary;;    Family History  Problem Relation Age of Onset   Allergies Daughter    Allergies Son    Leukemia Sister    Prostate cancer Father      Social History   Tobacco Use   Smoking Status Former   Packs/day: 0.50   Years: 50.00   Pack years: 25.00   Types: Cigarettes   Start date: 02/06/1961   Quit date: 02/07/2011   Years since quitting: 9.8  Smokeless Tobacco Never    Social History   Substance and Sexual Activity  Alcohol Use Yes   Alcohol/week: 0.0 standard drinks   Comment: occasional     No Known Allergies  Current Outpatient Medications  Medication Sig Dispense Refill   amLODipine (NORVASC) 5 MG tablet Take 5 mg by mouth daily.     cetirizine (ZYRTEC) 10 MG tablet Take 10 mg by mouth daily.     citalopram (CELEXA) 20 MG tablet Take 20 mg by mouth daily.     dextromethorphan-guaiFENesin (MUCINEX DM) 30-600 MG 12hr tablet Take 1 tablet by mouth 2 (two) times daily as needed for cough.     fluticasone (FLONASE) 50 MCG/ACT nasal spray Place 1 spray into both nostrils daily.     HYDROcodone-acetaminophen (NORCO/VICODIN) 5-325 MG tablet Take 1 tablet by mouth every 6 (six) hours as needed for moderate pain or severe pain.     losartan-hydrochlorothiazide (HYZAAR) 100-25 MG per tablet Take 1 tablet by mouth daily.      montelukast (SINGULAIR) 10 MG tablet Take 10 mg by mouth at bedtime.     pantoprazole (PROTONIX) 40 MG tablet Take 40 mg by mouth daily.     Propylene Glycol-Glycerin (SOOTHE) 0.6-0.6 % SOLN Place 1 drop into both eyes daily as needed (eye irritation).     traZODone (DESYREL) 50 MG tablet Take 50-100 mg by mouth at bedtime.     No current facility-administered medications for this visit.    Review of Systems  Constitutional:  Negative for weight loss.  HENT:  Positive for hearing loss.   Respiratory:  Positive for shortness of breath.   Gastrointestinal:  Positive for constipation.  Genitourinary:  Positive for frequency.  Musculoskeletal:  Positive for joint pain and myalgias.    PHYSICAL EXAMINATION: BP 117/81   Pulse 74   Resp 20   Ht 6\' 2"  (1.88 m)   Wt 217 lb (98.4 kg)   SpO2 98% Comment: RA  BMI 27.86 kg/m   Physical Exam Constitutional:      General: He is not in acute distress.    Appearance: Normal appearance. He is normal weight. He is not ill-appearing.  Eyes:     Extraocular Movements: Extraocular movements intact.  Cardiovascular:     Rate and Rhythm: Normal rate.  Pulmonary:     Effort: Pulmonary effort is normal. No respiratory distress.  Musculoskeletal:        General: Normal range of motion.     Cervical back: Normal range of motion.  Skin:    General: Skin is warm and dry.  Neurological:     General: No focal deficit present.     Mental Status: He is alert and oriented to person, place, and time.    Diagnostic Studies & Laboratory data:     Recent Radiology Findings:   No results found.     I have independently reviewed the above radiology studies  and reviewed the findings with the patient.   Recent Lab Findings: Lab Results  Component Value Date   WBC 4.7 10/26/2020   HGB 15.2 10/26/2020   HCT 43.6 10/26/2020   PLT 313 10/26/2020   GLUCOSE 96 10/26/2020   NA 136 10/26/2020   K 3.3 (L) 10/26/2020   CL 103 10/26/2020   CREATININE 1.23 10/26/2020   BUN 10 10/26/2020   CO2 21 (L) 10/26/2020   TSH 1.07 01/13/2014     PFTs: - FVC: 91% - FEV1: 86% -DLCO: 86%  Problem List: 2.2 cm semisolid right upper lobe pulmonary nodule.  Increase in size from 1.6 cm since December 2018.  Minimal uptake on PET/CT. 9 mm right lower lobe subpleural nodule which has been stable since 2018.  Also has minimal uptake on PET/CT.  Assessment / Plan:   75 year old male with a 2.2 cm semisolid right upper lobe pulmonary nodule which is concerning for primary lung cancer.  It has grown in size from 1.6 cm since 2018.  He did undergo a biopsy which was nondiagnostic.  Additionally has other subpleural nodules which have been stable, and emphysematous changes to both lungs.  Based off of his lung function he would tolerate a lobectomy.  I would like to schedule this is a  combination procedure with Dr. Valeta Harms for marking of the semisolid nodule for wedge resection prior to lobectomy.  We will also remove the right lower lobe subpleural nodule.  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     I  spent 40 minutes with  the patient face to face in counseling and coordination of care.    Lajuana Matte 12/10/2020 10:13 AM

## 2020-12-10 ENCOUNTER — Institutional Professional Consult (permissible substitution): Payer: Medicare HMO | Admitting: Thoracic Surgery (Cardiothoracic Vascular Surgery)

## 2020-12-10 ENCOUNTER — Other Ambulatory Visit: Payer: Self-pay

## 2020-12-10 ENCOUNTER — Other Ambulatory Visit: Payer: Self-pay | Admitting: Thoracic Surgery (Cardiothoracic Vascular Surgery)

## 2020-12-10 VITALS — BP 117/81 | HR 74 | Resp 20 | Ht 74.0 in | Wt 217.0 lb

## 2020-12-10 DIAGNOSIS — I712 Thoracic aortic aneurysm, without rupture, unspecified: Secondary | ICD-10-CM

## 2020-12-10 DIAGNOSIS — R911 Solitary pulmonary nodule: Secondary | ICD-10-CM

## 2020-12-13 ENCOUNTER — Other Ambulatory Visit: Payer: Self-pay

## 2020-12-13 ENCOUNTER — Ambulatory Visit (HOSPITAL_COMMUNITY): Payer: Medicare HMO | Attending: Internal Medicine

## 2020-12-13 DIAGNOSIS — I2510 Atherosclerotic heart disease of native coronary artery without angina pectoris: Secondary | ICD-10-CM

## 2020-12-13 DIAGNOSIS — Z01810 Encounter for preprocedural cardiovascular examination: Secondary | ICD-10-CM | POA: Diagnosis not present

## 2020-12-13 DIAGNOSIS — R0600 Dyspnea, unspecified: Secondary | ICD-10-CM | POA: Diagnosis not present

## 2020-12-13 DIAGNOSIS — R911 Solitary pulmonary nodule: Secondary | ICD-10-CM | POA: Diagnosis not present

## 2020-12-13 DIAGNOSIS — I712 Thoracic aortic aneurysm, without rupture, unspecified: Secondary | ICD-10-CM | POA: Insufficient documentation

## 2020-12-13 LAB — MYOCARDIAL PERFUSION IMAGING
Base ST Depression (mm): 0 mm
LV dias vol: 73 mL (ref 62–150)
LV sys vol: 23 mL
Nuc Stress EF: 68 %
Peak HR: 85 {beats}/min
Rest HR: 70 {beats}/min
Rest Nuclear Isotope Dose: 10.1 mCi
SDS: 0
SRS: 0
SSS: 0
ST Depression (mm): 0 mm
Stress Nuclear Isotope Dose: 31.6 mCi
TID: 1.12

## 2020-12-13 MED ORDER — REGADENOSON 0.4 MG/5ML IV SOLN
0.4000 mg | Freq: Once | INTRAVENOUS | Status: AC
  Administered 2020-12-13: 0.4 mg via INTRAVENOUS

## 2020-12-13 MED ORDER — TECHNETIUM TC 99M TETROFOSMIN IV KIT
31.6000 | PACK | Freq: Once | INTRAVENOUS | Status: AC | PRN
  Administered 2020-12-13: 31.6 via INTRAVENOUS
  Filled 2020-12-13: qty 32

## 2020-12-13 MED ORDER — TECHNETIUM TC 99M TETROFOSMIN IV KIT
10.1000 | PACK | Freq: Once | INTRAVENOUS | Status: AC | PRN
  Administered 2020-12-13: 10.1 via INTRAVENOUS
  Filled 2020-12-13: qty 11

## 2020-12-14 DIAGNOSIS — M26609 Unspecified temporomandibular joint disorder, unspecified side: Secondary | ICD-10-CM | POA: Diagnosis not present

## 2020-12-14 DIAGNOSIS — R911 Solitary pulmonary nodule: Secondary | ICD-10-CM | POA: Diagnosis not present

## 2020-12-14 DIAGNOSIS — I712 Thoracic aortic aneurysm, without rupture, unspecified: Secondary | ICD-10-CM | POA: Diagnosis not present

## 2020-12-14 DIAGNOSIS — Z01810 Encounter for preprocedural cardiovascular examination: Secondary | ICD-10-CM | POA: Diagnosis not present

## 2020-12-14 DIAGNOSIS — R6884 Jaw pain: Secondary | ICD-10-CM | POA: Diagnosis not present

## 2020-12-15 ENCOUNTER — Other Ambulatory Visit: Payer: Self-pay | Admitting: *Deleted

## 2020-12-15 ENCOUNTER — Encounter: Payer: Self-pay | Admitting: *Deleted

## 2020-12-15 DIAGNOSIS — R911 Solitary pulmonary nodule: Secondary | ICD-10-CM

## 2020-12-17 ENCOUNTER — Telehealth: Payer: Self-pay | Admitting: Pulmonary Disease

## 2020-12-17 DIAGNOSIS — R911 Solitary pulmonary nodule: Secondary | ICD-10-CM

## 2020-12-17 NOTE — Telephone Encounter (Signed)
PCCM:  Case discussed with Dr. Kipp Brood and scheduling.  We have agreed to schedule combination case on 01/06/2021.  Plan for robotic assisted bronchoscopy with fiducial dye marking ICG/methylene blue followed by robotic assisted thoracic surgery by Dr. Kipp Brood.  Orders placed for case.  Has already been scheduled however we will need prior authorization.  Garner Nash, DO [facility] 12/17/2020 5:28 PM

## 2020-12-17 NOTE — Telephone Encounter (Signed)
-----   Message from Laury Deep, RN sent at 12/15/2020  9:17 AM EST ----- 12/1.  Once I book it I will send you and HL all the info.  Thanks, Thurmond Butts ----- Message ----- From: Garner Nash, DO Sent: 12/14/2020   5:27 PM EST To: Laury Deep, RN, Lajuana Matte, MD  Thurmond Butts,  Nov 23rd? Dec 1st or 2nd?  Thanks  Brad    ----- Message ----- From: Lajuana Matte, MD Sent: 12/10/2020  10:15 AM EST To: Laury Deep, RN, Garner Nash, DO  Hey, Mr. Suman was very nice.  I definitely think he should be marked prior to surgery.  Let me know what day to have available.  Thanks, H

## 2020-12-20 ENCOUNTER — Encounter: Payer: Self-pay | Admitting: Pulmonary Disease

## 2020-12-27 ENCOUNTER — Telehealth: Payer: Self-pay | Admitting: Pulmonary Disease

## 2020-12-27 DIAGNOSIS — R911 Solitary pulmonary nodule: Secondary | ICD-10-CM

## 2020-12-27 NOTE — Telephone Encounter (Signed)
Noted.   [facility] team to contact patient regarding scheduling CT.

## 2020-12-27 NOTE — Telephone Encounter (Signed)
PCCM:  Orders placed for Super D [place], DO [facility] 12/27/2020 8:20 AM

## 2020-12-27 NOTE — Telephone Encounter (Signed)
-----   Message from Darral Dash, RN sent at 12/24/2020  1:34 PM EST ----- Regarding: superD Hey, I can't see where Mr. Lee Leblanc was set up for a CT prior to his dye marking.  Can you order this?  Larene [place]

## 2021-01-03 ENCOUNTER — Other Ambulatory Visit: Payer: Self-pay

## 2021-01-03 ENCOUNTER — Ambulatory Visit (HOSPITAL_COMMUNITY)
Admission: RE | Admit: 2021-01-03 | Discharge: 2021-01-03 | Disposition: A | Discharge status: Home / Self Care | Payer: Medicare HMO | Source: Ambulatory Visit | Attending: Pulmonary Disease | Admitting: Pulmonary Disease

## 2021-01-03 DIAGNOSIS — R911 Solitary pulmonary nodule: Secondary | ICD-10-CM | POA: Insufficient documentation

## 2021-01-03 NOTE — Pre-Procedure Instructions (Signed)
Surgical Instructions    Your procedure is scheduled on Thursday 01/06/21.   Report to [facility] Main Entrance "A" at 09:30 A.M., then check in with the Admitting office.  Call this number if you have problems the morning of surgery:  [phone]   If you have any questions prior to your surgery date call [phone]: Open Monday-Friday 8am-4pm    Remember:  Do not eat or drink after midnight the night before your surgery    Take these medicines the morning of surgery with A SIP OF WATER   amLODipine (NORVASC)   cetirizine (ZYRTEC)   citalopram (CELEXA)   fluticasone (FLONASE)   pantoprazole (PROTONIX)    acetaminophen (TYLENOL)- If needed  HYDROcodone-acetaminophen (NORCO/VICODIN)- If needed  Propylene Glycol-Glycerin (SOOTHE)- If needed  As of today, STOP taking any Aspirin (unless otherwise instructed by your surgeon) Aleve, Naproxen, Ibuprofen, Motrin, Advil, Goody's, BC's, all herbal medications, fish oil, and all vitamins.     After your COVID test   You are not required to quarantine however you are required to wear a well-fitting mask when you are out and around people not in your household.  If your mask becomes wet or soiled, replace with a new one.  Wash your hands often with soap and water for 20 seconds or clean your hands with an alcohol-based hand sanitizer that contains at least 60% alcohol.  Do not share personal items.  Notify your provider: if you are in close contact with someone who has COVID  or if you develop a fever of 100.4 or greater, sneezing, cough, sore throat, shortness of breath or body aches.             Do not wear jewelry or makeup Do not wear lotions, powders, perfumes/colognes, or deodorant. Do not shave 48 hours prior to surgery.  Men may shave face and neck. Do not bring valuables to the hospital. DO Not wear nail polish, gel polish, artificial nails, or any other type of covering on natural nails including finger and  toenails. If patients have artificial nails, gel coating, etc. that need to be removed by a nail salon, please have this removed prior to surgery or surgery may need to be canceled/delayed if the surgeon/ anesthesia feels like the patient is unable to be adequately monitored.             [facility] is not responsible for any belongings or valuables.  Do NOT Smoke (Tobacco/Vaping)  24 hours prior to your procedure  If you use a CPAP at night, you may bring your mask for your overnight stay.   Contacts, glasses, hearing aids, dentures or partials may not be worn into surgery, please bring cases for these belongings   For patients admitted to the hospital, discharge time will be determined by your treatment team.   Patients discharged the day of surgery will not be allowed to drive home, and someone needs to stay with them for 24 hours.  NO VISITORS WILL BE ALLOWED IN PRE-OP WHERE PATIENTS ARE PREPPED FOR SURGERY.  ONLY 1 SUPPORT PERSON MAY BE PRESENT IN THE WAITING ROOM WHILE YOU ARE IN SURGERY.  IF YOU ARE TO BE ADMITTED, ONCE YOU ARE IN YOUR ROOM YOU WILL BE ALLOWED TWO (2) VISITORS. 1 (ONE) VISITOR MAY STAY OVERNIGHT BUT MUST ARRIVE TO THE ROOM BY 8pm.  Minor children may have two parents present. Special consideration for safety and communication needs will be reviewed on a case by case basis.  Special  instructions:    Oral Hygiene is also important to reduce your risk of infection.  Remember - BRUSH YOUR TEETH THE MORNING OF SURGERY WITH YOUR REGULAR TOOTHPASTE   [facility]- Preparing For Surgery  Before surgery, you can play an important role. Because skin is not sterile, your skin needs to be as free of germs as possible. You can reduce the number of germs on your skin by washing with CHG (chlorahexidine gluconate) Soap before surgery.  CHG is an antiseptic cleaner which kills germs and bonds with the skin to continue killing germs even after washing.     Please do not use if you  have an allergy to CHG or antibacterial soaps. If your skin becomes reddened/irritated stop using the CHG.  Do not shave (including legs and underarms) for at least 48 hours prior to first CHG shower. It is OK to shave your face.  Please follow these instructions carefully.     Shower the NIGHT BEFORE SURGERY and the MORNING OF SURGERY with CHG Soap.   If you chose to wash your hair, wash your hair first as usual with your normal shampoo. After you shampoo, rinse your hair and body thoroughly to remove the shampoo.  Then [employer] and genitals (private parts) with your normal soap and rinse thoroughly to remove soap.  After that Use CHG Soap as you would any other liquid soap. You can apply CHG directly to the skin and wash gently with a scrungie or a clean washcloth.   Apply the CHG Soap to your body ONLY FROM THE NECK DOWN.  Do not use on open wounds or open sores. Avoid contact with your eyes, ears, mouth and genitals (private parts). Wash Face and genitals (private parts)  with your normal soap.   Wash thoroughly, paying special attention to the area where your surgery will be performed.  Thoroughly rinse your body with warm water from the neck down.  DO NOT shower/wash with your normal soap after using and rinsing off the CHG Soap.  Pat yourself dry with a CLEAN TOWEL.  Wear CLEAN PAJAMAS to bed the night before surgery  Place CLEAN SHEETS on your bed the night before your surgery  DO NOT SLEEP WITH PETS.   Day of Surgery:  Take a shower with CHG soap. Wear Clean/Comfortable clothing the morning of surgery Do not apply any deodorants/lotions.   Remember to brush your teeth WITH YOUR REGULAR TOOTHPASTE.   Please read over the following fact sheets that you were given.

## 2021-01-04 ENCOUNTER — Other Ambulatory Visit: Payer: Self-pay | Admitting: Pulmonary Disease

## 2021-01-04 ENCOUNTER — Inpatient Hospital Stay (HOSPITAL_COMMUNITY)
Admission: RE | Admit: 2021-01-04 | Discharge: 2021-01-04 | Disposition: A | Discharge status: Home / Self Care | Payer: Medicare HMO | Source: Ambulatory Visit

## 2021-01-04 LAB — SARS CORONAVIRUS 2 (TAT 6-24 HRS): SARS Coronavirus 2: NEGATIVE

## 2021-01-05 ENCOUNTER — Encounter (HOSPITAL_COMMUNITY)
Admission: RE | Admit: 2021-01-05 | Discharge: 2021-01-05 | Disposition: A | Discharge status: Home / Self Care | Payer: Medicare HMO | Source: Ambulatory Visit | Attending: Pulmonary Disease | Admitting: Pulmonary Disease

## 2021-01-05 ENCOUNTER — Encounter (HOSPITAL_COMMUNITY): Payer: Self-pay

## 2021-01-05 ENCOUNTER — Ambulatory Visit (HOSPITAL_COMMUNITY)
Admission: RE | Admit: 2021-01-05 | Discharge: 2021-01-05 | Disposition: A | Discharge status: Home / Self Care | Payer: Medicare HMO | Source: Ambulatory Visit | Attending: Thoracic Surgery (Cardiothoracic Vascular Surgery) | Admitting: Thoracic Surgery (Cardiothoracic Vascular Surgery)

## 2021-01-05 ENCOUNTER — Other Ambulatory Visit: Payer: Self-pay

## 2021-01-05 DIAGNOSIS — Z01818 Encounter for other preprocedural examination: Secondary | ICD-10-CM | POA: Insufficient documentation

## 2021-01-05 DIAGNOSIS — R911 Solitary pulmonary nodule: Secondary | ICD-10-CM

## 2021-01-05 DIAGNOSIS — J449 Chronic obstructive pulmonary disease, unspecified: Secondary | ICD-10-CM | POA: Diagnosis not present

## 2021-01-05 LAB — URINALYSIS, ROUTINE W REFLEX MICROSCOPIC
Bilirubin Urine: NEGATIVE
Glucose, UA: NEGATIVE mg/dL
Hgb urine dipstick: NEGATIVE
Ketones, ur: NEGATIVE mg/dL
Leukocytes,Ua: NEGATIVE
Nitrite: NEGATIVE
Protein, ur: NEGATIVE mg/dL
Specific Gravity, Urine: 1.01 (ref 1.005–1.030)
pH: 6.5 (ref 5.0–8.0)

## 2021-01-05 LAB — COMPREHENSIVE METABOLIC PANEL
ALT: 36 U/L (ref 0–44)
AST: 37 U/L (ref 15–41)
Albumin: 3.9 g/dL (ref 3.5–5.0)
Alkaline Phosphatase: 100 U/L (ref 38–126)
Anion gap: 9 (ref 5–15)
BUN: 11 mg/dL (ref 8–23)
CO2: 22 mmol/L (ref 22–32)
Calcium: 9.5 mg/dL (ref 8.9–10.3)
Chloride: 106 mmol/L (ref 98–111)
Creatinine, Ser: 1.23 mg/dL (ref 0.61–1.24)
GFR, Estimated: >60 mL/min (ref >60)
Glucose, Bld: 101 mg/dL — ABNORMAL HIGH (ref 70–99)
Potassium: 3.4 mmol/L — ABNORMAL LOW (ref 3.5–5.1)
Sodium: 137 mmol/L (ref 135–145)
Total Bilirubin: 0.3 mg/dL (ref 0.3–1.2)
Total Protein: 7.2 g/dL (ref 6.5–8.1)

## 2021-01-05 LAB — CBC
HCT: 44.3 % (ref 39.0–52.0)
Hemoglobin: 15.5 g/dL (ref 13.0–17.0)
MCH: 31.1 pg (ref 26.0–34.0)
MCHC: 35 g/dL (ref 30.0–36.0)
MCV: 89 fL (ref 80.0–100.0)
Platelets: 311 10*3/uL (ref 150–400)
RBC: 4.98 MIL/uL (ref 4.22–5.81)
RDW: 13.7 % (ref 11.5–15.5)
WBC: 4.8 10*3/uL (ref 4.0–10.5)
nRBC: 0 % (ref 0.0–0.2)

## 2021-01-05 LAB — BLOOD GAS, ARTERIAL
Acid-base deficit: 0.3 mmol/L (ref 0.0–2.0)
Bicarbonate: 23.4 mmol/L (ref 20.0–28.0)
Drawn by: 58793
FIO2: 21
O2 Saturation: 98.1 %
Patient temperature: 37
pCO2 arterial: 35.6 mmHg (ref 32.0–48.0)
pH, Arterial: 7.433 (ref 7.350–7.450)
pO2, Arterial: 103 mmHg (ref 83.0–108.0)

## 2021-01-05 LAB — PROTIME-INR
INR: 1 (ref 0.8–1.2)
Prothrombin Time: 12.9 seconds (ref 11.4–15.2)

## 2021-01-05 LAB — SURGICAL PCR SCREEN
MRSA, PCR: NEGATIVE
Staphylococcus aureus: NEGATIVE

## 2021-01-05 LAB — APTT: aPTT: 29 seconds (ref 24–36)

## 2021-01-05 LAB — TYPE AND SCREEN
ABO/RH(D): O NEG
Antibody Screen: NEGATIVE

## 2021-01-05 NOTE — Progress Notes (Signed)
PCP - Dr. Seward Carol Cardiologist - No see cardiothoracic   Chest x-ray - 01/05/21 EKG - 01/05/21 Stress Test - 12/13/20 ECHO - Denies Cardiac Cath - Denies  Sleep Study - Denies  DM : Denies  COVID TEST- 01/04/21 negative through [facility]   Anesthesia review: No  Patient denies shortness of breath, fever, cough and chest pain at PAT appointment   All instructions explained to the patient, with a verbal understanding of the material. Patient agrees to go over the instructions while at home for a better understanding. Patient also instructed to wear a mask while in public after being tested for COVID-19. The opportunity to ask questions was provided.

## 2021-01-06 ENCOUNTER — Ambulatory Visit (HOSPITAL_COMMUNITY): Payer: Medicare HMO

## 2021-01-06 ENCOUNTER — Encounter (HOSPITAL_COMMUNITY): Payer: Self-pay | Admitting: Pulmonary Disease

## 2021-01-06 ENCOUNTER — Ambulatory Visit (HOSPITAL_COMMUNITY): Payer: Medicare HMO | Admitting: Certified Registered Nurse Anesthetist

## 2021-01-06 ENCOUNTER — Other Ambulatory Visit: Payer: Self-pay

## 2021-01-06 ENCOUNTER — Encounter (HOSPITAL_COMMUNITY)
Admission: RE | Disposition: A | Discharge status: Home / Self Care | Payer: Self-pay | Source: Home / Self Care | Attending: Pulmonary Disease

## 2021-01-06 ENCOUNTER — Ambulatory Visit (HOSPITAL_COMMUNITY)
Admission: RE | Admit: 2021-01-06 | Discharge: 2021-01-06 | Disposition: A | Discharge status: Home / Self Care | Payer: Medicare HMO | Attending: Pulmonary Disease | Admitting: Pulmonary Disease

## 2021-01-06 DIAGNOSIS — Z419 Encounter for procedure for purposes other than remedying health state, unspecified: Secondary | ICD-10-CM | POA: Insufficient documentation

## 2021-01-06 DIAGNOSIS — Z538 Procedure and treatment not carried out for other reasons: Secondary | ICD-10-CM | POA: Diagnosis not present

## 2021-01-06 DIAGNOSIS — R911 Solitary pulmonary nodule: Secondary | ICD-10-CM

## 2021-01-06 SURGERY — WEDGE RESECTION, LUNG, ROBOT-ASSISTED, THORACOSCOPIC
Anesthesia: General | Site: Chest | Laterality: Right

## 2021-01-06 SURGERY — CANCELLED PROCEDURE
Anesthesia: General

## 2021-01-06 MED ORDER — FENTANYL CITRATE (PF) 250 MCG/5ML IJ SOLN
INTRAMUSCULAR | Status: AC
  Filled 2021-01-06: qty 5

## 2021-01-06 MED ORDER — LACTATED RINGERS IV SOLN: INTRAVENOUS | Status: DC

## 2021-01-06 MED ORDER — CHLORHEXIDINE GLUCONATE 0.12 % MT SOLN
OROMUCOSAL | Status: AC
  Administered 2021-01-06: 15 mL via OROMUCOSAL
  Filled 2021-01-06: qty 15

## 2021-01-06 MED ORDER — ORAL CARE MOUTH RINSE: 15.0000 mL | Freq: Once | OROMUCOSAL | Status: AC

## 2021-01-06 MED ORDER — ACETAMINOPHEN 500 MG PO TABS
1000.0000 mg | ORAL_TABLET | Freq: Once | ORAL | Status: AC
  Administered 2021-01-06: 1000 mg via ORAL
  Filled 2021-01-06: qty 2

## 2021-01-06 MED ORDER — PROPOFOL 10 MG/ML IV BOLUS
INTRAVENOUS | Status: AC
  Filled 2021-01-06: qty 20

## 2021-01-06 MED ORDER — EPINEPHRINE 1 MG/10ML IJ SOSY
PREFILLED_SYRINGE | INTRAMUSCULAR | Status: AC
  Filled 2021-01-06: qty 10

## 2021-01-06 MED ORDER — CHLORHEXIDINE GLUCONATE 0.12 % MT SOLN
15.0000 mL | Freq: Once | OROMUCOSAL | Status: AC
  Filled 2021-01-06: qty 15

## 2021-01-06 MED ORDER — MIDAZOLAM HCL 2 MG/2ML IJ SOLN
INTRAMUSCULAR | Status: AC
  Filled 2021-01-06: qty 2

## 2021-01-06 MED ORDER — CALCIUM CHLORIDE 10 % IV SOLN
INTRAVENOUS | Status: AC
  Filled 2021-01-06: qty 10

## 2021-01-06 MED ORDER — VASOPRESSIN 20 UNIT/ML IV SOLN
INTRAVENOUS | Status: AC
  Filled 2021-01-06: qty 1

## 2021-01-06 MED ORDER — CEFAZOLIN SODIUM-DEXTROSE 2-4 GM/100ML-% IV SOLN: 2.0000 g | INTRAVENOUS | Status: DC

## 2021-01-06 MED ORDER — CEFAZOLIN SODIUM-DEXTROSE 2-4 GM/100ML-% IV SOLN
INTRAVENOUS | Status: AC
  Filled 2021-01-06: qty 100

## 2021-01-06 MED ORDER — PROPOFOL 1000 MG/100ML IV EMUL
INTRAVENOUS | Status: AC
  Filled 2021-01-06: qty 100

## 2021-01-06 NOTE — H&P (Signed)
PCCM:  Due to unforeseen circumstances the patient's case will need to be rescheduled.  This was discussed with the patient and Dr. Kipp Brood.  Garner Nash, DO [facility] 01/06/2021 2:08 PM

## 2021-01-06 NOTE — Anesthesia Preprocedure Evaluation (Addendum)
Anesthesia Evaluation  Patient identified by MRN, date of birth, ID band Patient awake    Reviewed: Allergy & Precautions, NPO status , Patient's Chart, lab work & pertinent test results  Airway Mallampati: II  TM Distance: >3 FB Neck ROM: Full    Dental  (+) Dental Advisory Given, Edentulous Lower, Edentulous Upper   Pulmonary COPD,  COPD inhaler, former smoker,  Lung nodule    Pulmonary exam normal breath sounds clear to auscultation       Cardiovascular hypertension, Pt. on medications (-) angina(-) Past MI Normal cardiovascular exam Rhythm:Regular Rate:Normal     Neuro/Psych PSYCHIATRIC DISORDERS Depression negative neurological ROS     GI/Hepatic Neg liver ROS, GERD  Medicated and Controlled,  Endo/Other  negative endocrine ROS  Renal/GU Renal InsufficiencyRenal disease     Musculoskeletal negative musculoskeletal ROS (+)   Abdominal   Peds  Hematology negative hematology ROS (+)   Anesthesia Other Findings   Reproductive/Obstetrics                            Anesthesia Physical Anesthesia Plan  ASA: 3  Anesthesia Plan: General   Post-op Pain Management:    Induction: Intravenous  PONV Risk Score and Plan: 2 and Dexamethasone and Ondansetron  Airway Management Planned: Oral ETT and Double Lumen EBT  Additional Equipment:   Intra-op Plan:   Post-operative Plan: Extubation in OR  Informed Consent: I have reviewed the patients History and Physical, chart, labs and discussed the procedure including the risks, benefits and alternatives for the proposed anesthesia with the patient or authorized representative who has indicated his/her understanding and acceptance.     Dental advisory given  Plan Discussed with: CRNA  Anesthesia Plan Comments: (2 large PIVs, clear sight)       Anesthesia Quick Evaluation

## 2021-01-07 ENCOUNTER — Other Ambulatory Visit: Payer: Self-pay | Admitting: *Deleted

## 2021-01-07 ENCOUNTER — Encounter: Payer: Self-pay | Admitting: *Deleted

## 2021-01-07 DIAGNOSIS — R911 Solitary pulmonary nodule: Secondary | ICD-10-CM

## 2021-01-17 NOTE — Pre-Procedure Instructions (Signed)
Surgical Instructions    Your procedure is scheduled on Thursday, December 15th.   Report to [facility] Main Entrance "A" at 5:30 A.M., then check in with the Admitting office.  Call this number if you have problems the morning of surgery:  [phone]   If you have any questions prior to your surgery date call [phone]: Open Monday-Friday 8am-4pm    Remember:  Do not eat or drink after midnight the night before your surgery    Take these medicines the morning of surgery with A SIP OF WATER   amLODipine (NORVASC)   cetirizine (ZYRTEC)   citalopram (CELEXA)   fluticasone (FLONASE)   pantoprazole (PROTONIX)    acetaminophen (TYLENOL)- If needed  HYDROcodone-acetaminophen (NORCO/VICODIN)- If needed  Propylene Glycol-Glycerin (SOOTHE)- If needed  As of today, STOP taking any Aspirin (unless otherwise instructed by your surgeon) Aleve, Naproxen, Ibuprofen, Motrin, Advil, Goody's, BC's, all herbal medications, fish oil, and all vitamins.     After your COVID test   You are not required to quarantine however you are required to wear a well-fitting mask when you are out and around people not in your household.  If your mask becomes wet or soiled, replace with a new one.  Wash your hands often with soap and water for 20 seconds or clean your hands with an alcohol-based hand sanitizer that contains at least 60% alcohol.  Do not share personal items.  Notify your provider: if you are in close contact with someone who has COVID  or if you develop a fever of 100.4 or greater, sneezing, cough, sore throat, shortness of breath or body aches.             Do not wear jewelry or makeup Do not wear lotions, powders, colognes, or deodorant. Do not shave 48 hours prior to surgery.  Men may shave face and neck. Do not bring valuables to the hospital.            [facility] is not responsible for any belongings or valuables.  Do NOT Smoke (Tobacco/Vaping)  24 hours prior to your  procedure  If you use a CPAP at night, you may bring your mask for your overnight stay.   Contacts, glasses, hearing aids, dentures or partials may not be worn into surgery, please bring cases for these belongings   For patients admitted to the hospital, discharge time will be determined by your treatment team.   Patients discharged the day of surgery will not be allowed to drive home, and someone needs to stay with them for 24 hours.  NO VISITORS WILL BE ALLOWED IN PRE-OP WHERE PATIENTS ARE PREPPED FOR SURGERY.  ONLY 1 SUPPORT PERSON MAY BE PRESENT IN THE WAITING ROOM WHILE YOU ARE IN SURGERY.  IF YOU ARE TO BE ADMITTED, ONCE YOU ARE IN YOUR ROOM YOU WILL BE ALLOWED TWO (2) VISITORS. 1 (ONE) VISITOR MAY STAY OVERNIGHT BUT MUST ARRIVE TO THE ROOM BY 8pm.  Minor children may have two parents present. Special consideration for safety and communication needs will be reviewed on a case by case basis.  Special instructions:    Oral Hygiene is also important to reduce your risk of infection.  Remember - BRUSH YOUR TEETH THE MORNING OF SURGERY WITH YOUR REGULAR TOOTHPASTE   [place]- Preparing For Surgery  Before surgery, you can play an important role. Because skin is not sterile, your skin needs to be as free of germs as possible. You can reduce the number of  germs on your skin by washing with CHG (chlorahexidine gluconate) Soap before surgery.  CHG is an antiseptic cleaner which kills germs and bonds with the skin to continue killing germs even after washing.     Please do not use if you have an allergy to CHG or antibacterial soaps. If your skin becomes reddened/irritated stop using the CHG.  Do not shave (including legs and underarms) for at least 48 hours prior to first CHG shower. It is OK to shave your face.  Please follow these instructions carefully.     Shower the NIGHT BEFORE SURGERY and the MORNING OF SURGERY with CHG Soap.   If you chose to wash your hair, wash your hair first  as usual with your normal shampoo. After you shampoo, rinse your hair and body thoroughly to remove the shampoo.  Then [employer] and genitals (private parts) with your normal soap and rinse thoroughly to remove soap.  After that Use CHG Soap as you would any other liquid soap. You can apply CHG directly to the skin and wash gently with a scrungie or a clean washcloth.   Apply the CHG Soap to your body ONLY FROM THE NECK DOWN.  Do not use on open wounds or open sores. Avoid contact with your eyes, ears, mouth and genitals (private parts). Wash Face and genitals (private parts)  with your normal soap.   Wash thoroughly, paying special attention to the area where your surgery will be performed.  Thoroughly rinse your body with warm water from the neck down.  DO NOT shower/wash with your normal soap after using and rinsing off the CHG Soap.  Pat yourself dry with a CLEAN TOWEL.  Wear CLEAN PAJAMAS to bed the night before surgery  Place CLEAN SHEETS on your bed the night before your surgery  DO NOT SLEEP WITH PETS.   Day of Surgery:  Take a shower with CHG soap. Wear Clean/Comfortable clothing the morning of surgery Do not apply any deodorants/lotions.   Remember to brush your teeth WITH YOUR REGULAR TOOTHPASTE.   Please read over the following fact sheets that you were given.

## 2021-01-18 ENCOUNTER — Other Ambulatory Visit: Payer: Self-pay

## 2021-01-18 ENCOUNTER — Encounter (HOSPITAL_COMMUNITY)
Admission: RE | Admit: 2021-01-18 | Discharge: 2021-01-18 | Disposition: A | Discharge status: Home / Self Care | Payer: Medicare HMO | Source: Ambulatory Visit | Attending: Pulmonary Disease | Admitting: Pulmonary Disease

## 2021-01-18 ENCOUNTER — Encounter (HOSPITAL_COMMUNITY): Payer: Self-pay

## 2021-01-18 ENCOUNTER — Ambulatory Visit (HOSPITAL_COMMUNITY)
Admission: RE | Admit: 2021-01-18 | Discharge: 2021-01-18 | Disposition: A | Discharge status: Home / Self Care | Payer: Medicare HMO | Source: Ambulatory Visit | Attending: Thoracic Surgery (Cardiothoracic Vascular Surgery) | Admitting: Thoracic Surgery (Cardiothoracic Vascular Surgery)

## 2021-01-18 VITALS — BP 139/92 | HR 87 | Temp 97.8°F | Resp 18 | Ht 74.0 in | Wt 218.7 lb

## 2021-01-18 DIAGNOSIS — I517 Cardiomegaly: Secondary | ICD-10-CM | POA: Diagnosis not present

## 2021-01-18 DIAGNOSIS — Z8042 Family history of malignant neoplasm of prostate: Secondary | ICD-10-CM | POA: Diagnosis not present

## 2021-01-18 DIAGNOSIS — R911 Solitary pulmonary nodule: Secondary | ICD-10-CM

## 2021-01-18 DIAGNOSIS — D62 Acute posthemorrhagic anemia: Secondary | ICD-10-CM | POA: Diagnosis not present

## 2021-01-18 DIAGNOSIS — I10 Essential (primary) hypertension: Secondary | ICD-10-CM | POA: Diagnosis present

## 2021-01-18 DIAGNOSIS — I712 Thoracic aortic aneurysm, without rupture, unspecified: Secondary | ICD-10-CM | POA: Diagnosis not present

## 2021-01-18 DIAGNOSIS — I7121 Aneurysm of the ascending aorta, without rupture: Secondary | ICD-10-CM | POA: Diagnosis present

## 2021-01-18 DIAGNOSIS — K219 Gastro-esophageal reflux disease without esophagitis: Secondary | ICD-10-CM | POA: Diagnosis present

## 2021-01-18 DIAGNOSIS — C3411 Malignant neoplasm of upper lobe, right bronchus or lung: Secondary | ICD-10-CM | POA: Diagnosis present

## 2021-01-18 DIAGNOSIS — J939 Pneumothorax, unspecified: Secondary | ICD-10-CM | POA: Diagnosis not present

## 2021-01-18 DIAGNOSIS — J439 Emphysema, unspecified: Secondary | ICD-10-CM | POA: Diagnosis not present

## 2021-01-18 DIAGNOSIS — J449 Chronic obstructive pulmonary disease, unspecified: Secondary | ICD-10-CM | POA: Diagnosis not present

## 2021-01-18 DIAGNOSIS — Z79899 Other long term (current) drug therapy: Secondary | ICD-10-CM | POA: Diagnosis not present

## 2021-01-18 DIAGNOSIS — Z01812 Encounter for preprocedural laboratory examination: Secondary | ICD-10-CM | POA: Insufficient documentation

## 2021-01-18 DIAGNOSIS — Z77090 Contact with and (suspected) exposure to asbestos: Secondary | ICD-10-CM | POA: Diagnosis present

## 2021-01-18 DIAGNOSIS — Z01818 Encounter for other preprocedural examination: Secondary | ICD-10-CM | POA: Diagnosis not present

## 2021-01-18 DIAGNOSIS — Z20822 Contact with and (suspected) exposure to covid-19: Secondary | ICD-10-CM | POA: Diagnosis present

## 2021-01-18 DIAGNOSIS — Z7982 Long term (current) use of aspirin: Secondary | ICD-10-CM | POA: Diagnosis not present

## 2021-01-18 DIAGNOSIS — J95812 Postprocedural air leak: Secondary | ICD-10-CM | POA: Diagnosis not present

## 2021-01-18 DIAGNOSIS — Z806 Family history of leukemia: Secondary | ICD-10-CM | POA: Diagnosis not present

## 2021-01-18 DIAGNOSIS — J432 Centrilobular emphysema: Secondary | ICD-10-CM | POA: Diagnosis present

## 2021-01-18 DIAGNOSIS — Z87891 Personal history of nicotine dependence: Secondary | ICD-10-CM | POA: Diagnosis not present

## 2021-01-18 LAB — COMPREHENSIVE METABOLIC PANEL
ALT: 38 U/L (ref 0–44)
AST: 40 U/L (ref 15–41)
Albumin: 3.9 g/dL (ref 3.5–5.0)
Alkaline Phosphatase: 102 U/L (ref 38–126)
Anion gap: 11 (ref 5–15)
BUN: 10 mg/dL (ref 8–23)
CO2: 21 mmol/L — ABNORMAL LOW (ref 22–32)
Calcium: 9.6 mg/dL (ref 8.9–10.3)
Chloride: 103 mmol/L (ref 98–111)
Creatinine, Ser: 1.29 mg/dL — ABNORMAL HIGH (ref 0.61–1.24)
GFR, Estimated: 58 mL/min — ABNORMAL LOW (ref >60)
Glucose, Bld: 133 mg/dL — ABNORMAL HIGH (ref 70–99)
Potassium: 3.6 mmol/L (ref 3.5–5.1)
Sodium: 135 mmol/L (ref 135–145)
Total Bilirubin: 0.5 mg/dL (ref 0.3–1.2)
Total Protein: 7.3 g/dL (ref 6.5–8.1)

## 2021-01-18 LAB — BLOOD GAS, ARTERIAL
Acid-base deficit: 0.2 mmol/L (ref 0.0–2.0)
Bicarbonate: 23.6 mmol/L (ref 20.0–28.0)
Drawn by: 60286
FIO2: 21
O2 Saturation: 98.7 %
Patient temperature: 37
pCO2 arterial: 36.6 mmHg (ref 32.0–48.0)
pH, Arterial: 7.425 (ref 7.350–7.450)
pO2, Arterial: 123 mmHg — ABNORMAL HIGH (ref 83.0–108.0)

## 2021-01-18 LAB — CBC
HCT: 45 % (ref 39.0–52.0)
Hemoglobin: 15.8 g/dL (ref 13.0–17.0)
MCH: 31.3 pg (ref 26.0–34.0)
MCHC: 35.1 g/dL (ref 30.0–36.0)
MCV: 89.1 fL (ref 80.0–100.0)
Platelets: 344 10*3/uL (ref 150–400)
RBC: 5.05 MIL/uL (ref 4.22–5.81)
RDW: 13.7 % (ref 11.5–15.5)
WBC: 4.6 10*3/uL (ref 4.0–10.5)
nRBC: 0 % (ref 0.0–0.2)

## 2021-01-18 LAB — URINALYSIS, ROUTINE W REFLEX MICROSCOPIC
Bilirubin Urine: NEGATIVE
Glucose, UA: NEGATIVE mg/dL
Hgb urine dipstick: NEGATIVE
Ketones, ur: NEGATIVE mg/dL
Leukocytes,Ua: NEGATIVE
Nitrite: NEGATIVE
Protein, ur: NEGATIVE mg/dL
Specific Gravity, Urine: 1.01 (ref 1.005–1.030)
pH: 6.5 (ref 5.0–8.0)

## 2021-01-18 LAB — PROTIME-INR
INR: 1 (ref 0.8–1.2)
Prothrombin Time: 13 seconds (ref 11.4–15.2)

## 2021-01-18 LAB — SARS CORONAVIRUS 2 (TAT 6-24 HRS): SARS Coronavirus 2: NEGATIVE

## 2021-01-18 LAB — TYPE AND SCREEN
ABO/RH(D): O NEG
Antibody Screen: NEGATIVE

## 2021-01-18 LAB — APTT: aPTT: 28 seconds (ref 24–36)

## 2021-01-18 NOTE — Progress Notes (Signed)
PCP - Dr. Seward Carol Cardiologist - denies  PPM/ICD - n/a  Chest x-ray - 01/18/21 EKG - 01/05/21 Stress Test - 12/13/20 ECHO - denies Cardiac Cath - denies  Sleep Study - denies CPAP - denies  Blood Thinner Instructions: n/a Aspirin Instructions: n/a  NPO  COVID TEST- 01/18/21, done in PAT  Anesthesia review: No  Patient denies shortness of breath, fever, cough and chest pain at PAT appointment   All instructions explained to the patient, with a verbal understanding of the material. Patient agrees to go over the instructions while at home for a better understanding. Patient also instructed to self quarantine after being tested for COVID-19. The opportunity to ask questions was provided.

## 2021-01-20 ENCOUNTER — Encounter (HOSPITAL_COMMUNITY)
Admission: AD | Disposition: A | Discharge status: Home / Self Care | Payer: Self-pay | Source: Home / Self Care | Attending: Thoracic Surgery (Cardiothoracic Vascular Surgery)

## 2021-01-20 ENCOUNTER — Inpatient Hospital Stay (HOSPITAL_COMMUNITY): Payer: Medicare HMO

## 2021-01-20 ENCOUNTER — Ambulatory Visit (HOSPITAL_COMMUNITY): Payer: Medicare HMO | Admitting: Certified Registered Nurse Anesthetist

## 2021-01-20 ENCOUNTER — Other Ambulatory Visit: Payer: Self-pay

## 2021-01-20 ENCOUNTER — Ambulatory Visit (HOSPITAL_COMMUNITY): Payer: Medicare HMO

## 2021-01-20 ENCOUNTER — Inpatient Hospital Stay (HOSPITAL_COMMUNITY)
Admission: AD | Admit: 2021-01-20 | Discharge: 2021-01-21 | DRG: 164 | Disposition: A | Discharge status: Home / Self Care | Payer: Medicare HMO | Attending: Thoracic Surgery (Cardiothoracic Vascular Surgery) | Admitting: Thoracic Surgery (Cardiothoracic Vascular Surgery)

## 2021-01-20 ENCOUNTER — Encounter (HOSPITAL_COMMUNITY): Payer: Self-pay | Admitting: Pulmonary Disease

## 2021-01-20 DIAGNOSIS — K219 Gastro-esophageal reflux disease without esophagitis: Secondary | ICD-10-CM | POA: Diagnosis present

## 2021-01-20 DIAGNOSIS — C3411 Malignant neoplasm of upper lobe, right bronchus or lung: Secondary | ICD-10-CM | POA: Diagnosis not present

## 2021-01-20 DIAGNOSIS — J939 Pneumothorax, unspecified: Secondary | ICD-10-CM | POA: Diagnosis not present

## 2021-01-20 DIAGNOSIS — D62 Acute posthemorrhagic anemia: Secondary | ICD-10-CM | POA: Diagnosis not present

## 2021-01-20 DIAGNOSIS — Z8042 Family history of malignant neoplasm of prostate: Secondary | ICD-10-CM

## 2021-01-20 DIAGNOSIS — Z79899 Other long term (current) drug therapy: Secondary | ICD-10-CM | POA: Diagnosis not present

## 2021-01-20 DIAGNOSIS — I7121 Aneurysm of the ascending aorta, without rupture: Secondary | ICD-10-CM | POA: Diagnosis present

## 2021-01-20 DIAGNOSIS — Z7982 Long term (current) use of aspirin: Secondary | ICD-10-CM | POA: Diagnosis not present

## 2021-01-20 DIAGNOSIS — J95812 Postprocedural air leak: Secondary | ICD-10-CM | POA: Diagnosis not present

## 2021-01-20 DIAGNOSIS — Z87891 Personal history of nicotine dependence: Secondary | ICD-10-CM | POA: Diagnosis not present

## 2021-01-20 DIAGNOSIS — N289 Disorder of kidney and ureter, unspecified: Secondary | ICD-10-CM | POA: Diagnosis present

## 2021-01-20 DIAGNOSIS — I517 Cardiomegaly: Secondary | ICD-10-CM | POA: Diagnosis not present

## 2021-01-20 DIAGNOSIS — I10 Essential (primary) hypertension: Secondary | ICD-10-CM | POA: Diagnosis not present

## 2021-01-20 DIAGNOSIS — F32A Depression, unspecified: Secondary | ICD-10-CM | POA: Diagnosis present

## 2021-01-20 DIAGNOSIS — J432 Centrilobular emphysema: Secondary | ICD-10-CM | POA: Diagnosis not present

## 2021-01-20 DIAGNOSIS — I712 Thoracic aortic aneurysm, without rupture, unspecified: Secondary | ICD-10-CM | POA: Diagnosis not present

## 2021-01-20 DIAGNOSIS — Z806 Family history of leukemia: Secondary | ICD-10-CM

## 2021-01-20 DIAGNOSIS — Z09 Encounter for follow-up examination after completed treatment for conditions other than malignant neoplasm: Secondary | ICD-10-CM

## 2021-01-20 DIAGNOSIS — Z20822 Contact with and (suspected) exposure to covid-19: Secondary | ICD-10-CM | POA: Diagnosis not present

## 2021-01-20 DIAGNOSIS — J439 Emphysema, unspecified: Secondary | ICD-10-CM | POA: Diagnosis not present

## 2021-01-20 DIAGNOSIS — R911 Solitary pulmonary nodule: Secondary | ICD-10-CM

## 2021-01-20 DIAGNOSIS — J449 Chronic obstructive pulmonary disease, unspecified: Secondary | ICD-10-CM | POA: Diagnosis not present

## 2021-01-20 DIAGNOSIS — Z902 Acquired absence of lung [part of]: Secondary | ICD-10-CM

## 2021-01-20 DIAGNOSIS — Z77090 Contact with and (suspected) exposure to asbestos: Secondary | ICD-10-CM | POA: Diagnosis present

## 2021-01-20 DIAGNOSIS — Z419 Encounter for procedure for purposes other than remedying health state, unspecified: Secondary | ICD-10-CM

## 2021-01-20 HISTORY — PX: LYMPH NODE DISSECTION: SHX5087

## 2021-01-20 HISTORY — PX: INTERCOSTAL NERVE BLOCK: SHX5021

## 2021-01-20 HISTORY — PX: BRONCHIAL BIOPSY: SHX5109

## 2021-01-20 HISTORY — PX: BRONCHIAL BRUSHINGS: SHX5108

## 2021-01-20 HISTORY — PX: FIDUCIAL MARKER PLACEMENT: SHX6858

## 2021-01-20 SURGERY — BRONCHOSCOPY, WITH BIOPSY USING ELECTROMAGNETIC NAVIGATION
Anesthesia: General

## 2021-01-20 SURGERY — WEDGE RESECTION, LUNG, ROBOT-ASSISTED, THORACOSCOPIC
Anesthesia: General | Site: Chest | Laterality: Right

## 2021-01-20 MED ORDER — OXYCODONE HCL 5 MG/5ML PO SOLN: 5.0000 mg | Freq: Once | ORAL | Status: DC | PRN

## 2021-01-20 MED ORDER — ROCURONIUM BROMIDE 10 MG/ML (PF) SYRINGE
PREFILLED_SYRINGE | INTRAVENOUS | Status: DC | PRN
  Administered 2021-01-20: 60 mg via INTRAVENOUS
  Administered 2021-01-20: 20 mg via INTRAVENOUS
  Administered 2021-01-20: 30 mg via INTRAVENOUS
  Administered 2021-01-20: 50 mg via INTRAVENOUS

## 2021-01-20 MED ORDER — FENTANYL CITRATE (PF) 100 MCG/2ML IJ SOLN
INTRAMUSCULAR | Status: AC
  Filled 2021-01-20: qty 2

## 2021-01-20 MED ORDER — DM-GUAIFENESIN ER 30-600 MG PO TB12: 1.0000 | ORAL_TABLET | Freq: Two times a day (BID) | ORAL | Status: DC | PRN

## 2021-01-20 MED ORDER — SUGAMMADEX SODIUM 200 MG/2ML IV SOLN
INTRAVENOUS | Status: DC | PRN
  Administered 2021-01-20 (×2): 200 mg via INTRAVENOUS

## 2021-01-20 MED ORDER — METHYLENE BLUE 0.5 % INJ SOLN
INTRAVENOUS | Status: DC | PRN
  Administered 2021-01-20: 1.5 mL

## 2021-01-20 MED ORDER — PHENYLEPHRINE 40 MCG/ML (10ML) SYRINGE FOR IV PUSH (FOR BLOOD PRESSURE SUPPORT)
PREFILLED_SYRINGE | INTRAVENOUS | Status: AC
  Filled 2021-01-20: qty 10

## 2021-01-20 MED ORDER — BISACODYL 5 MG PO TBEC
10.0000 mg | DELAYED_RELEASE_TABLET | Freq: Every day | ORAL | Status: DC
  Administered 2021-01-21: 10 mg via ORAL
  Filled 2021-01-20: qty 2

## 2021-01-20 MED ORDER — CHLORHEXIDINE GLUCONATE 0.12 % MT SOLN
OROMUCOSAL | Status: AC
  Administered 2021-01-20: 15 mL via OROMUCOSAL
  Filled 2021-01-20: qty 15

## 2021-01-20 MED ORDER — ONDANSETRON HCL 4 MG/2ML IJ SOLN
INTRAMUSCULAR | Status: DC | PRN
  Administered 2021-01-20: 4 mg via INTRAVENOUS

## 2021-01-20 MED ORDER — SENNOSIDES-DOCUSATE SODIUM 8.6-50 MG PO TABS
1.0000 | ORAL_TABLET | Freq: Every day | ORAL | Status: DC
  Administered 2021-01-20: 1 via ORAL
  Filled 2021-01-20: qty 1

## 2021-01-20 MED ORDER — CITALOPRAM HYDROBROMIDE 20 MG PO TABS
20.0000 mg | ORAL_TABLET | Freq: Every day | ORAL | Status: DC
  Administered 2021-01-21: 20 mg via ORAL
  Filled 2021-01-20: qty 1

## 2021-01-20 MED ORDER — PROPOFOL 10 MG/ML IV BOLUS
INTRAVENOUS | Status: AC
  Filled 2021-01-20: qty 20

## 2021-01-20 MED ORDER — TRAMADOL HCL 50 MG PO TABS
50.0000 mg | ORAL_TABLET | Freq: Four times a day (QID) | ORAL | Status: DC | PRN
  Administered 2021-01-20: 100 mg via ORAL
  Filled 2021-01-20: qty 2

## 2021-01-20 MED ORDER — MONTELUKAST SODIUM 10 MG PO TABS: 10.0000 mg | ORAL_TABLET | Freq: Every day | ORAL | Status: DC

## 2021-01-20 MED ORDER — BUPIVACAINE LIPOSOME 1.3 % IJ SUSP
INTRAMUSCULAR | Status: AC
  Filled 2021-01-20: qty 20

## 2021-01-20 MED ORDER — HYDROCHLOROTHIAZIDE 25 MG PO TABS
25.0000 mg | ORAL_TABLET | Freq: Every day | ORAL | Status: DC
  Administered 2021-01-21: 25 mg via ORAL
  Filled 2021-01-20: qty 1

## 2021-01-20 MED ORDER — ORAL CARE MOUTH RINSE: 15.0000 mL | Freq: Once | OROMUCOSAL | Status: AC

## 2021-01-20 MED ORDER — LACTATED RINGERS IV SOLN: INTRAVENOUS | Status: DC

## 2021-01-20 MED ORDER — CEFAZOLIN SODIUM-DEXTROSE 2-4 GM/100ML-% IV SOLN
2.0000 g | Freq: Three times a day (TID) | INTRAVENOUS | Status: AC
  Administered 2021-01-20: 2 g via INTRAVENOUS
  Filled 2021-01-20 (×2): qty 100

## 2021-01-20 MED ORDER — LIDOCAINE 2% (20 MG/ML) 5 ML SYRINGE
INTRAMUSCULAR | Status: AC
  Filled 2021-01-20: qty 5

## 2021-01-20 MED ORDER — EPHEDRINE 5 MG/ML INJ
INTRAVENOUS | Status: AC
  Filled 2021-01-20: qty 5

## 2021-01-20 MED ORDER — LACTATED RINGERS IV SOLN: INTRAVENOUS | Status: DC | PRN

## 2021-01-20 MED ORDER — PANTOPRAZOLE SODIUM 40 MG PO TBEC
40.0000 mg | DELAYED_RELEASE_TABLET | Freq: Every day | ORAL | Status: DC
  Administered 2021-01-20 – 2021-01-21 (×2): 40 mg via ORAL
  Filled 2021-01-20 (×2): qty 1

## 2021-01-20 MED ORDER — PROPOFOL 10 MG/ML IV BOLUS
INTRAVENOUS | Status: DC | PRN
  Administered 2021-01-20: 170 mg via INTRAVENOUS
  Administered 2021-01-20 (×8): 50 mg via INTRAVENOUS

## 2021-01-20 MED ORDER — LABETALOL HCL 5 MG/ML IV SOLN
INTRAVENOUS | Status: DC | PRN
  Administered 2021-01-20 (×2): 5 mg via INTRAVENOUS

## 2021-01-20 MED ORDER — LORATADINE 10 MG PO TABS
10.0000 mg | ORAL_TABLET | Freq: Every day | ORAL | Status: DC
  Administered 2021-01-21: 10 mg via ORAL
  Filled 2021-01-20: qty 1

## 2021-01-20 MED ORDER — TRAZODONE HCL 50 MG PO TABS: 50.0000 mg | ORAL_TABLET | Freq: Every day | ORAL | Status: DC

## 2021-01-20 MED ORDER — FLUTICASONE PROPIONATE 50 MCG/ACT NA SUSP
1.0000 | Freq: Every day | NASAL | Status: DC
  Administered 2021-01-21: 1 via NASAL
  Filled 2021-01-20: qty 16

## 2021-01-20 MED ORDER — LIDOCAINE 2% (20 MG/ML) 5 ML SYRINGE
INTRAMUSCULAR | Status: DC | PRN
  Administered 2021-01-20: 60 mg via INTRAVENOUS

## 2021-01-20 MED ORDER — ENOXAPARIN SODIUM 40 MG/0.4ML IJ SOSY
40.0000 mg | PREFILLED_SYRINGE | Freq: Every day | INTRAMUSCULAR | Status: DC
  Administered 2021-01-21: 40 mg via SUBCUTANEOUS
  Filled 2021-01-20: qty 0.4

## 2021-01-20 MED ORDER — TRAZODONE HCL 50 MG PO TABS
50.0000 mg | ORAL_TABLET | Freq: Every day | ORAL | Status: DC
  Administered 2021-01-20: 100 mg via ORAL
  Filled 2021-01-20: qty 2

## 2021-01-20 MED ORDER — ACETAMINOPHEN 160 MG/5ML PO SOLN: 1000.0000 mg | Freq: Four times a day (QID) | ORAL | Status: DC

## 2021-01-20 MED ORDER — 0.9 % SODIUM CHLORIDE (POUR BTL) OPTIME
TOPICAL | Status: DC | PRN
  Administered 2021-01-20: 2000 mL

## 2021-01-20 MED ORDER — DEXAMETHASONE SODIUM PHOSPHATE 10 MG/ML IJ SOLN
INTRAMUSCULAR | Status: DC | PRN
  Administered 2021-01-20: 4 mg via INTRAVENOUS

## 2021-01-20 MED ORDER — ONDANSETRON HCL 4 MG/2ML IJ SOLN
INTRAMUSCULAR | Status: AC
  Filled 2021-01-20: qty 2

## 2021-01-20 MED ORDER — POLYVINYL ALCOHOL 1.4 % OP SOLN
1.0000 [drp] | Freq: Every day | OPHTHALMIC | Status: DC | PRN
Start: 2021-01-20 — End: 2021-01-22
  Filled 2021-01-20: qty 15

## 2021-01-20 MED ORDER — FENTANYL CITRATE (PF) 250 MCG/5ML IJ SOLN
INTRAMUSCULAR | Status: DC | PRN
  Administered 2021-01-20 (×4): 50 ug via INTRAVENOUS
  Administered 2021-01-20: 100 ug via INTRAVENOUS
  Administered 2021-01-20 (×3): 50 ug via INTRAVENOUS

## 2021-01-20 MED ORDER — ACETAMINOPHEN 500 MG PO TABS
1000.0000 mg | ORAL_TABLET | Freq: Four times a day (QID) | ORAL | Status: DC
  Administered 2021-01-20 – 2021-01-21 (×4): 1000 mg via ORAL
  Filled 2021-01-20 (×5): qty 2

## 2021-01-20 MED ORDER — PHENYLEPHRINE HCL-NACL 20-0.9 MG/250ML-% IV SOLN
INTRAVENOUS | Status: DC | PRN
  Administered 2021-01-20: 25 ug/min via INTRAVENOUS

## 2021-01-20 MED ORDER — HEMOSTATIC AGENTS (NO CHARGE) OPTIME
TOPICAL | Status: DC | PRN
  Administered 2021-01-20: 1 via TOPICAL

## 2021-01-20 MED ORDER — CHLORHEXIDINE GLUCONATE 0.12 % MT SOLN: 15.0000 mL | Freq: Once | OROMUCOSAL | Status: AC

## 2021-01-20 MED ORDER — KETOROLAC TROMETHAMINE 15 MG/ML IJ SOLN
15.0000 mg | Freq: Four times a day (QID) | INTRAMUSCULAR | Status: DC
  Administered 2021-01-20 – 2021-01-21 (×3): 15 mg via INTRAVENOUS
  Filled 2021-01-20 (×3): qty 1

## 2021-01-20 MED ORDER — OXYCODONE HCL 5 MG PO TABS: 5.0000 mg | ORAL_TABLET | Freq: Once | ORAL | Status: DC | PRN

## 2021-01-20 MED ORDER — ONDANSETRON HCL 4 MG/2ML IJ SOLN: 4.0000 mg | Freq: Four times a day (QID) | INTRAMUSCULAR | Status: DC | PRN

## 2021-01-20 MED ORDER — FENTANYL CITRATE (PF) 250 MCG/5ML IJ SOLN
INTRAMUSCULAR | Status: AC
  Filled 2021-01-20: qty 5

## 2021-01-20 MED ORDER — DEXAMETHASONE SODIUM PHOSPHATE 10 MG/ML IJ SOLN
INTRAMUSCULAR | Status: AC
  Filled 2021-01-20: qty 1

## 2021-01-20 MED ORDER — AMLODIPINE BESYLATE 5 MG PO TABS
5.0000 mg | ORAL_TABLET | Freq: Every day | ORAL | Status: DC
  Administered 2021-01-21: 5 mg via ORAL
  Filled 2021-01-20: qty 1

## 2021-01-20 MED ORDER — ROCURONIUM BROMIDE 10 MG/ML (PF) SYRINGE
PREFILLED_SYRINGE | INTRAVENOUS | Status: AC
  Filled 2021-01-20: qty 20

## 2021-01-20 MED ORDER — CEFAZOLIN SODIUM-DEXTROSE 2-4 GM/100ML-% IV SOLN
2.0000 g | INTRAVENOUS | Status: AC
  Administered 2021-01-20: 2 g via INTRAVENOUS

## 2021-01-20 MED ORDER — LOSARTAN POTASSIUM-HCTZ 100-25 MG PO TABS: 1.0000 | ORAL_TABLET | Freq: Every day | ORAL | Status: DC

## 2021-01-20 MED ORDER — BUPIVACAINE HCL (PF) 0.5 % IJ SOLN
INTRAMUSCULAR | Status: AC
  Filled 2021-01-20: qty 30

## 2021-01-20 MED ORDER — CEFAZOLIN SODIUM-DEXTROSE 2-4 GM/100ML-% IV SOLN
INTRAVENOUS | Status: AC
  Administered 2021-01-21: 2 g via INTRAVENOUS
  Filled 2021-01-20: qty 100

## 2021-01-20 MED ORDER — SODIUM CHLORIDE FLUSH 0.9 % IV SOLN
INTRAVENOUS | Status: DC | PRN
  Administered 2021-01-20: 100 mL

## 2021-01-20 MED ORDER — FENTANYL CITRATE (PF) 100 MCG/2ML IJ SOLN
25.0000 ug | INTRAMUSCULAR | Status: DC | PRN
  Administered 2021-01-20 (×3): 50 ug via INTRAVENOUS

## 2021-01-20 MED ORDER — LOSARTAN POTASSIUM 50 MG PO TABS
100.0000 mg | ORAL_TABLET | Freq: Every day | ORAL | Status: DC
  Administered 2021-01-21: 100 mg via ORAL
  Filled 2021-01-20: qty 2

## 2021-01-20 SURGICAL SUPPLY — 106 items
BLADE CLIPPER SURG (BLADE) ×1 IMPLANT
CANISTER SUCT 3000ML PPV (MISCELLANEOUS) ×3 IMPLANT
CANNULA REDUC XI 12-8 STAPL (CANNULA) ×2
CANNULA REDUCER 12-8 DVNC XI (CANNULA) ×2 IMPLANT
CATH TROCAR 20FR (CATHETERS) IMPLANT
CHLORAPREP W/TINT 26 (MISCELLANEOUS) ×2 IMPLANT
CLIP VESOCCLUDE MED 6/CT (CLIP) IMPLANT
CNTNR URN SCR LID CUP LEK RST (MISCELLANEOUS) ×2 IMPLANT
CONN ST 1/4X3/8  BEN (MISCELLANEOUS)
CONN ST 1/4X3/8 BEN (MISCELLANEOUS) IMPLANT
CONT SPEC 4OZ STRL OR WHT (MISCELLANEOUS) ×12
DEFOGGER SCOPE WARMER CLEARIFY (MISCELLANEOUS) ×2 IMPLANT
DERMABOND ADVANCED (GAUZE/BANDAGES/DRESSINGS) ×1
DERMABOND ADVANCED .7 DNX12 (GAUZE/BANDAGES/DRESSINGS) ×1 IMPLANT
DRAIN CHANNEL 28F RND 3/8 FF (WOUND CARE) IMPLANT
DRAIN CHANNEL 32F RND 10.7 FF (WOUND CARE) IMPLANT
DRAPE ARM DVNC X/XI (DISPOSABLE) ×4 IMPLANT
DRAPE COLUMN DVNC XI (DISPOSABLE) ×1 IMPLANT
DRAPE CV SPLIT W-CLR ANES SCRN (DRAPES) ×2 IMPLANT
DRAPE DA VINCI XI ARM (DISPOSABLE) ×4
DRAPE DA VINCI XI COLUMN (DISPOSABLE) ×1
DRAPE HALF SHEET 40X57 (DRAPES) ×2 IMPLANT
DRAPE ORTHO SPLIT 77X108 STRL (DRAPES) ×1
DRAPE SURG ORHT 6 SPLT 77X108 (DRAPES) ×1 IMPLANT
ELECT BLADE 6.5 EXT (BLADE) IMPLANT
ELECT REM PT RETURN 9FT ADLT (ELECTROSURGICAL) ×2
ELECTRODE REM PT RTRN 9FT ADLT (ELECTROSURGICAL) ×1 IMPLANT
GAUZE KITTNER 4X5 RF (MISCELLANEOUS) ×3 IMPLANT
GAUZE SPONGE 4X4 12PLY STRL (GAUZE/BANDAGES/DRESSINGS) ×1 IMPLANT
GLOVE SURG ENC MOIS LTX SZ7.5 (GLOVE) ×4 IMPLANT
GOWN STRL REUS W/ TWL LRG LVL3 (GOWN DISPOSABLE) ×2 IMPLANT
GOWN STRL REUS W/ TWL XL LVL3 (GOWN DISPOSABLE) ×3 IMPLANT
GOWN STRL REUS W/TWL 2XL LVL3 (GOWN DISPOSABLE) ×2 IMPLANT
GOWN STRL REUS W/TWL LRG LVL3 (GOWN DISPOSABLE) ×2
GOWN STRL REUS W/TWL XL LVL3 (GOWN DISPOSABLE) ×3
HEMOSTAT SURGICEL 2X14 (HEMOSTASIS) ×5 IMPLANT
IRRIGATOR SUCT 8 DISP DVNC XI (IRRIGATION / IRRIGATOR) IMPLANT
IRRIGATOR SUCTION 8MM XI DISP (IRRIGATION / IRRIGATOR) ×1
KIT BASIN OR (CUSTOM PROCEDURE TRAY) ×2 IMPLANT
KIT SUCTION CATH 14FR (SUCTIONS) IMPLANT
KIT TURNOVER KIT B (KITS) ×2 IMPLANT
NDL HYPO 25GX1X1/2 BEV (NEEDLE) ×1 IMPLANT
NEEDLE HYPO 25GX1X1/2 BEV (NEEDLE) ×2 IMPLANT
NS IRRIG 1000ML POUR BTL (IV SOLUTION) ×6 IMPLANT
PACK CHEST (CUSTOM PROCEDURE TRAY) ×2 IMPLANT
PAD ARMBOARD 7.5X6 YLW CONV (MISCELLANEOUS) ×10 IMPLANT
PORT ACCESS TROCAR AIRSEAL 12 (TROCAR) ×1 IMPLANT
PORT ACCESS TROCAR AIRSEAL 5M (TROCAR) ×1
RELOAD STAPLE 45 2.5 WHT DVNC (STAPLE) IMPLANT
RELOAD STAPLE 45 3.5 BLU DVNC (STAPLE) IMPLANT
RELOAD STAPLE 45 4.3 GRN DVNC (STAPLE) IMPLANT
RELOAD STAPLE 45 4.6 BLK DVNC (STAPLE) IMPLANT
RELOAD STAPLER 2.5X45 WHT DVNC (STAPLE) ×5 IMPLANT
RELOAD STAPLER 3.5X45 BLU DVNC (STAPLE) ×9 IMPLANT
RELOAD STAPLER 4.3X45 GRN DVNC (STAPLE) ×3 IMPLANT
RELOAD STAPLER 45 4.6 BLK DVNC (STAPLE) ×2 IMPLANT
SEAL CANN UNIV 5-8 DVNC XI (MISCELLANEOUS) ×2 IMPLANT
SEAL XI 5MM-8MM UNIVERSAL (MISCELLANEOUS) ×2
SET TRI-LUMEN FLTR TB AIRSEAL (TUBING) ×2 IMPLANT
SOLUTION ELECTROLUBE (MISCELLANEOUS) IMPLANT
STAPLER 45 DA VINCI SURE FORM (STAPLE) ×1
STAPLER 45 SUREFORM CVD (STAPLE) ×1
STAPLER 45 SUREFORM CVD DVNC (STAPLE) IMPLANT
STAPLER 45 SUREFORM DVNC (STAPLE) IMPLANT
STAPLER CANNULA SEAL DVNC XI (STAPLE) ×2 IMPLANT
STAPLER CANNULA SEAL XI (STAPLE) ×2
STAPLER RELOAD 2.5X45 WHITE (STAPLE) ×5
STAPLER RELOAD 2.5X45 WHT DVNC (STAPLE) ×5
STAPLER RELOAD 3.5X45 BLU DVNC (STAPLE) ×9
STAPLER RELOAD 3.5X45 BLUE (STAPLE) ×9
STAPLER RELOAD 4.3X45 GREEN (STAPLE) ×3
STAPLER RELOAD 4.3X45 GRN DVNC (STAPLE) ×3
STAPLER RELOAD 45 4.6 BLK (STAPLE) ×2
STAPLER RELOAD 45 4.6 BLK DVNC (STAPLE) ×2
STOPCOCK 4 WAY LG BORE MALE ST (IV SETS) ×2 IMPLANT
SUT MON AB 2-0 CT1 36 (SUTURE) IMPLANT
SUT PDS AB 1 CTX 36 (SUTURE) IMPLANT
SUT PROLENE 4 0 RB 1 (SUTURE)
SUT PROLENE 4-0 RB1 .5 CRCL 36 (SUTURE) IMPLANT
SUT SILK  1 MH (SUTURE) ×1
SUT SILK 1 MH (SUTURE) ×1 IMPLANT
SUT SILK 1 TIES 10X30 (SUTURE) IMPLANT
SUT SILK 2 0 SH (SUTURE) ×1 IMPLANT
SUT SILK 2 0SH CR/8 30 (SUTURE) IMPLANT
SUT VIC AB 1 CTX 36 (SUTURE)
SUT VIC AB 1 CTX36XBRD ANBCTR (SUTURE) IMPLANT
SUT VIC AB 2-0 CT1 27 (SUTURE) ×2
SUT VIC AB 2-0 CT1 TAPERPNT 27 (SUTURE) ×1 IMPLANT
SUT VIC AB 3-0 SH 27 (SUTURE) ×2
SUT VIC AB 3-0 SH 27X BRD (SUTURE) ×3 IMPLANT
SUT VICRYL 0 TIES 12 18 (SUTURE) ×2 IMPLANT
SUT VICRYL 0 UR6 27IN ABS (SUTURE) ×4 IMPLANT
SUT VICRYL 2 TP 1 (SUTURE) IMPLANT
SYR 10ML LL (SYRINGE) ×2 IMPLANT
SYR 20ML LL LF (SYRINGE) ×2 IMPLANT
SYR 50ML LL SCALE MARK (SYRINGE) ×2 IMPLANT
SYSTEM RETRIEVAL ANCHOR 15 (MISCELLANEOUS) ×2 IMPLANT
SYSTEM RETRIEVAL ANCHOR 8 (MISCELLANEOUS) ×1 IMPLANT
SYSTEM SAHARA CHEST DRAIN ATS (WOUND CARE) ×2 IMPLANT
TAPE CLOTH 4X10 WHT NS (GAUZE/BANDAGES/DRESSINGS) ×2 IMPLANT
TIP APPLICATOR SPRAY EXTEND 16 (VASCULAR PRODUCTS) IMPLANT
TOWEL GREEN STERILE (TOWEL DISPOSABLE) ×2 IMPLANT
TRAY FOLEY MTR SLVR 16FR STAT (SET/KITS/TRAYS/PACK) ×2 IMPLANT
TROCAR BLADELESS 15MM (ENDOMECHANICALS) IMPLANT
TUBING EXTENTION W/L.L. (IV SETS) ×2 IMPLANT
WATER STERILE IRR 1000ML POUR (IV SOLUTION) ×2 IMPLANT

## 2021-01-20 NOTE — H&P (Signed)
[address]       [place] [address]             [phone]                                                   Staley B Kaminsky [facility] Medical Record #[id number] Date of Birth: [birth year]   Referring: Garner Nash, DO Primary Care: Seward Carol, MD Primary Cardiologist: None    No events from last clinic appointment Vitals:   01/20/21 0555  BP: (!) 153/93  Pulse: 68  Resp: 18  Temp: 98 F (36.7 C)  SpO2: 96%   Alert NAD RR EWOB   OR today for R RATS, possible RULectomy  Per my last clinic note   Chief Complaint:        Chief Complaint  Patient presents with   Lung Lesion      Surgical consult, Chest CT 09/02/20, PET Scan 10/12/20, Bronch 10/26/20, PFT's 11/29/20      History of Present Illness:    Lee Leblanc 75 y.o. male referred for surgical evaluation of a right upper lobe groundglass opacity.  This was found incidentally on cross-sectional imaging for surveillance of his ascending aortic aneurysm.  He has undergone a navigational bronchoscopy on 11/26/2020 which was nondiagnostic.  He does occasionally have some exertional dyspnea.  He also complains of some dysphagia but has a known history of reflux.  He recently underwent back surgery and has had some joint pain from this.  His weight has been stable.  He denies any fatigue.       Smoking Hx: Quit in 2013     Zubrod Score: At the time of surgery this patients most appropriate activity status/level should be described as: [x]     0    Normal activity, no symptoms []     1    Restricted in physical strenuous activity but ambulatory, able to do out light work []     2    Ambulatory and capable of self care, unable to do work activities, up and about               >50 % of waking hours                              []     3    Only limited self care, in bed greater than 50% of waking hours []     4    Completely disabled, no self care, confined to bed or chair []     5    Moribund          Past Medical History:  Diagnosis Date   COPD (chronic obstructive pulmonary disease) ([place])      has not used albuterol inhaler for last 2 months   Depression     Dyspnea     GERD (gastroesophageal reflux disease)     Hypertension             Past Surgical History:  Procedure Laterality Date   BACK SURGERY     lasdt 1990's    lower x 7   BRONCHIAL BIOPSY   10/26/2020    Procedure: BRONCHIAL BIOPSIES;  Surgeon: Garner Nash, DO;  Location: [facility];  Service: Pulmonary;;  BRONCHIAL BRUSHINGS   10/26/2020    Procedure: BRONCHIAL BRUSHINGS;  Surgeon: Garner Nash, DO;  Location: [facility];  Service: Pulmonary;;   BRONCHIAL NEEDLE ASPIRATION BIOPSY   10/26/2020    Procedure: BRONCHIAL NEEDLE ASPIRATION BIOPSIES;  Surgeon: Garner Nash, DO;  Location: [facility];  Service: Pulmonary;;   BRONCHIAL WASHINGS   10/26/2020    Procedure: BRONCHIAL WASHINGS;  Surgeon: Garner Nash, DO;  Location: [facility];  Service: Pulmonary;;   COLONOSCOPY WITH PROPOFOL N/A 06/25/2012    Procedure: COLONOSCOPY WITH PROPOFOL;  Surgeon: Garlan Fair, MD;  Location: [facility];  Service: Endoscopy;  Laterality: N/A;   ESOPHAGOGASTRODUODENOSCOPY (EGD) WITH PROPOFOL N/A 06/25/2012    Procedure: ESOPHAGOGASTRODUODENOSCOPY (EGD) WITH PROPOFOL;  Surgeon: Garlan Fair, MD;  Location: [facility];  Service: Endoscopy;  Laterality: N/A;   EYE SURGERY Right     FIDUCIAL MARKER PLACEMENT   10/26/2020    Procedure: FIDUCIAL MARKER PLACEMENT;  Surgeon: Garner Nash, DO;  Location: [facility];  Service: Pulmonary;;   SHOULDER ARTHROSCOPY Right     VIDEO BRONCHOSCOPY WITH ENDOBRONCHIAL NAVIGATION N/A 10/26/2020    Procedure: VIDEO BRONCHOSCOPY WITH ENDOBRONCHIAL NAVIGATION;  Surgeon: Garner Nash, DO;  Location: [facility];  Service: Pulmonary;  Laterality: N/A;  ION w/ fiducial placement   VIDEO BRONCHOSCOPY WITH RADIAL ENDOBRONCHIAL ULTRASOUND   10/26/2020    Procedure:  VIDEO BRONCHOSCOPY WITH RADIAL ENDOBRONCHIAL ULTRASOUND;  Surgeon: Garner Nash, DO;  Location: [facility];  Service: Pulmonary;;           Family History  Problem Relation Age of Onset   Allergies Daughter     Allergies Son     Leukemia Sister     Prostate cancer Father          Social History        Tobacco Use  Smoking Status Former   Packs/day: 0.50   Years: 50.00   Pack years: 25.00   Types: Cigarettes   Start date: 02/06/1961   Quit date: 02/07/2011   Years since quitting: 9.8  Smokeless Tobacco Never    Social History        Substance and Sexual Activity  Alcohol Use Yes   Alcohol/week: 0.0 standard drinks    Comment: occasional        No Known Allergies         Current Outpatient Medications  Medication Sig Dispense Refill   amLODipine (NORVASC) 5 MG tablet Take 5 mg by mouth daily.       cetirizine (ZYRTEC) 10 MG tablet Take 10 mg by mouth daily.       citalopram (CELEXA) 20 MG tablet Take 20 mg by mouth daily.       dextromethorphan-guaiFENesin (MUCINEX DM) 30-600 MG 12hr tablet Take 1 tablet by mouth 2 (two) times daily as needed for cough.       fluticasone (FLONASE) 50 MCG/ACT nasal spray Place 1 spray into both nostrils daily.       HYDROcodone-acetaminophen (NORCO/VICODIN) 5-325 MG tablet Take 1 tablet by mouth every 6 (six) hours as needed for moderate pain or severe pain.       losartan-hydrochlorothiazide (HYZAAR) 100-25 MG per tablet Take 1 tablet by mouth daily.        montelukast (SINGULAIR) 10 MG tablet Take 10 mg by mouth at bedtime.       pantoprazole (PROTONIX) 40 MG tablet Take 40 mg by mouth daily.       Propylene Glycol-Glycerin (SOOTHE)  0.6-0.6 % SOLN Place 1 drop into both eyes daily as needed (eye irritation).       traZODone (DESYREL) 50 MG tablet Take 50-100 mg by mouth at bedtime.        No current facility-administered medications for this visit.      Review of Systems  Constitutional:  Negative for weight loss.  HENT:   Positive for hearing loss.   Respiratory:  Positive for shortness of breath.   Gastrointestinal:  Positive for constipation.  Genitourinary:  Positive for frequency.  Musculoskeletal:  Positive for joint pain and myalgias.      PHYSICAL EXAMINATION: BP 117/81    Pulse 74    Resp 20    Ht 6\' 2"  (1.88 m)    Wt 217 lb (98.4 kg)    SpO2 98% Comment: RA   BMI 27.86 kg/m  Physical Exam Constitutional:      General: He is not in acute distress.    Appearance: Normal appearance. He is normal weight. He is not ill-appearing.  Eyes:     Extraocular Movements: Extraocular movements intact.  Cardiovascular:     Rate and Rhythm: Normal rate.  Pulmonary:     Effort: Pulmonary effort is normal. No respiratory distress.  Musculoskeletal:        General: Normal range of motion.     Cervical back: Normal range of motion.  Skin:    General: Skin is warm and dry.  Neurological:     General: No focal deficit present.     Mental Status: He is alert and oriented to person, place, and time.      Diagnostic Studies & Laboratory data:     Recent Radiology Findings:    Imaging Results  No results found.         I have independently reviewed the above radiology studies  and reviewed the findings with the patient.    Recent Lab Findings: Recent Labs       Lab Results  Component Value Date    WBC 4.7 10/26/2020    HGB 15.2 10/26/2020    HCT 43.6 10/26/2020    PLT 313 10/26/2020    GLUCOSE 96 10/26/2020    NA 136 10/26/2020    K 3.3 (L) 10/26/2020    CL 103 10/26/2020    CREATININE 1.23 10/26/2020    BUN 10 10/26/2020    CO2 21 (L) 10/26/2020    TSH 1.07 01/13/2014          PFTs: - FVC: 91% - FEV1: 86% -DLCO: 86%   Problem List: 2.2 cm semisolid right upper lobe pulmonary nodule.  Increase in size from 1.6 cm since December 2018.  Minimal uptake on PET/CT. 9 mm right lower lobe subpleural nodule which has been stable since 2018.  Also has minimal uptake on PET/CT.    Assessment / Plan:   75 year old male with a 2.2 cm semisolid right upper lobe pulmonary nodule which is concerning for primary lung cancer.  It has grown in size from 1.6 cm since 2018.  He did undergo a biopsy which was nondiagnostic.  Additionally has other subpleural nodules which have been stable, and emphysematous changes to both lungs.  Based off of his lung function he would tolerate a lobectomy.  I would like to schedule this is a combination procedure with Dr. Valeta Harms for marking of the semisolid nodule for wedge resection prior to lobectomy.  We will also remove the right lower lobe subpleural nodule.  He does have  a history of working with fiberglass which may be the cause of this.

## 2021-01-20 NOTE — Anesthesia Procedure Notes (Signed)
Procedure Name: Intubation Date/Time: 01/20/2021 7:33 AM Performed by: Lowella Dell, CRNA Pre-anesthesia Checklist: Patient identified, Emergency Drugs available, Suction available and Patient being monitored Patient Re-evaluated:Patient Re-evaluated prior to induction Oxygen Delivery Method: Circle System Utilized Preoxygenation: Pre-oxygenation with 100% oxygen Induction Type: IV induction Ventilation: Mask ventilation without difficulty and Oral airway inserted - appropriate to patient size Laryngoscope Size: Mac and 4 Grade View: Grade I Tube type: Oral Tube size: 8.5 mm Number of attempts: 1 Airway Equipment and Method: Stylet Placement Confirmation: ETT inserted through vocal cords under direct vision, positive ETCO2 and breath sounds checked- equal and bilateral Secured at: 24 cm Tube secured with: Tape Dental Injury: Teeth and Oropharynx as per pre-operative assessment

## 2021-01-20 NOTE — Anesthesia Preprocedure Evaluation (Addendum)
Anesthesia Evaluation  Patient identified by MRN, date of birth, ID band Patient awake    Reviewed: Allergy & Precautions, H&P , NPO status , Patient's Chart, lab work & pertinent test results  Airway Mallampati: II   Neck ROM: full    Dental   Pulmonary COPD, former smoker,  Chest CT 2022 IMPRESSION: 1. Persistent cavitary/ground-glass nodule in the RIGHT upper lobe. Indeterminate finding. 2. Benign nodule in the superior aspect RIGHT lower lobe. 3. Multiple calcified pleural plaques    breath sounds clear to auscultation       Cardiovascular hypertension, Pt. on medications  Rhythm:regular Rate:Normal  Stress Test 2022 unremarkable   Neuro/Psych PSYCHIATRIC DISORDERS Depression negative neurological ROS     GI/Hepatic Neg liver ROS, GERD  Controlled and Medicated,  Endo/Other  negative endocrine ROS  Renal/GU Renal InsufficiencyRenal disease (Cr 1.29, K 3.6)  negative genitourinary   Musculoskeletal negative musculoskeletal ROS (+)   Abdominal   Peds  Hematology negative hematology ROS (+)   Anesthesia Other Findings   Reproductive/Obstetrics                            Anesthesia Physical Anesthesia Plan  ASA: 3  Anesthesia Plan: General   Post-op Pain Management: Tylenol PO (pre-op)   Induction: Intravenous  PONV Risk Score and Plan: 2 and Dexamethasone, Ondansetron and Treatment may vary due to age or medical condition  Airway Management Planned: Oral ETT and Double Lumen EBT  Additional Equipment: Arterial line  Intra-op Plan:   Post-operative Plan: Extubation in OR  Informed Consent: I have reviewed the patients History and Physical, chart, labs and discussed the procedure including the risks, benefits and alternatives for the proposed anesthesia with the patient or authorized representative who has indicated his/her understanding and acceptance.     Dental advisory  given  Plan Discussed with: CRNA, Anesthesiologist and Surgeon  Anesthesia Plan Comments: (2 IVs, clearsight or arterial line)       Anesthesia Quick Evaluation

## 2021-01-20 NOTE — Anesthesia Procedure Notes (Signed)
Procedure Name: Intubation Date/Time: 01/20/2021 8:42 AM Performed by: Dorthea Cove, CRNA Pre-anesthesia Checklist: Patient identified, Emergency Drugs available, Suction available and Patient being monitored Patient Re-evaluated:Patient Re-evaluated prior to induction Oxygen Delivery Method: Circle system utilized Preoxygenation: Pre-oxygenation with 100% oxygen Induction Type: IV induction Ventilation: Mask ventilation without difficulty Laryngoscope Size: 4 and Mac Grade View: Grade I Tube type: Oral Endobronchial tube: Left, Double lumen EBT and EBT position confirmed by fiberoptic bronchoscope and 41 Fr Number of attempts: 1 Airway Equipment and Method: Stylet Placement Confirmation: ETT inserted through vocal cords under direct vision, positive ETCO2 and breath sounds checked- equal and bilateral Secured at: 29 cm Tube secured with: Tape Dental Injury: Teeth and Oropharynx as per pre-operative assessment

## 2021-01-20 NOTE — Anesthesia Procedure Notes (Signed)
Arterial Line Insertion Start/End12/15/2022 8:44 AM, 01/20/2021 8:45 AM Performed by: Lowella Dell, CRNA, CRNA  Patient location: OR. Preanesthetic checklist: patient identified, IV checked, site marked, risks and benefits discussed, surgical consent, monitors and equipment checked, pre-op evaluation, timeout performed and anesthesia consent Patient sedated Left, radial was placed Catheter size: 20 G Hand hygiene performed  and maximum sterile barriers used   Attempts: 1 Procedure performed without using ultrasound guided technique. Following insertion, dressing applied and Biopatch. Post procedure assessment: normal  Patient tolerated the procedure well with no immediate complications.

## 2021-01-20 NOTE — Transfer of Care (Signed)
Immediate Anesthesia Transfer of Care Note  Patient: Lee Leblanc  Procedure(s) Performed: ROBOTIC ASSISTED NAVIGATIONAL BRONCHOSCOPY FIDUCIAL DYE MARKING BRONCHIAL BRUSHINGS BRONCHIAL BIOPSIES XI ROBOTIC ASSISTED THORASCOPY-RIGHT UPPER LOBE WEDGE RESECTION AND LOBECTOMY (Right: Chest) INTERCOSTAL NERVE BLOCK (Right: Chest) LYMPH NODE DISSECTION (Right: Chest)  Patient Location: PACU  Anesthesia Type:General  Level of Consciousness: awake, alert  and oriented  Airway & Oxygen Therapy: Patient Spontanous Breathing  Post-op Assessment: Report given to RN and Post -op Vital signs reviewed and stable  Post vital signs: Reviewed and stable  Last Vitals:  Vitals Value Taken Time  BP 133/69   Temp    Pulse 79 01/20/21 1232  Resp 17 01/20/21 1232  SpO2 96 % 01/20/21 1232  Vitals shown include unvalidated device data.  Last Pain:  Vitals:   01/20/21 0613  TempSrc:   PainSc: 7       Patients Stated Pain Goal: 3 (40/97/35 3299)  Complications: No notable events documented.

## 2021-01-20 NOTE — Op Note (Signed)
[address]       [place] [address]             [phone]        01/20/2021  Patient:  Lee Leblanc Pre-Op Dx: Right upper lobe pulmonary nodule Post-op Dx: Right upper lobe pulmonary nodule Procedure: - Robotic assisted right video thoracoscopy -Right upper lobe wedge resection -Right upper lobectomy - Mediastinal lymph node sampling - Intercostal nerve block  Surgeon and Role:      * Donyel Nester, Lucile Crater, MD - Primary  Assistant: Evonnie Pat, PA-C  An experienced assistant was required given the complexity of this surgery and the standard of surgical care. The assistant was needed for exposure, dissection, suctioning, retraction of delicate tissues and sutures, instrument exchange and for overall help during this procedure.    Anesthesia  general EBL: 115 ml Blood Administration: None Specimen: Right upper lobe wedge resection.  Right upper lobe.  Mediastinal and hilar lymph nodes.  Drains: 38 F argyle chest tube in right chest Counts: correct   Indications: 75 year old male with a 2.2 cm semisolid right upper lobe pulmonary nodule which is concerning for primary lung cancer.  It has grown in size from 1.6 cm since 2018.  He did undergo a biopsy which was nondiagnostic.  Additionally has other subpleural nodules which have been stable, and emphysematous changes to both lungs.  Based off of his lung function he would tolerate a lobectomy.  I would like to schedule this is a combination procedure with Dr. Valeta Harms for marking of the semisolid nodule for wedge resection prior to lobectomy.  We will also remove the right lower lobe subpleural nodule.  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  Findings: Navigational bronchoscopy, and the wedge resection results were inconclusive.  Given the high concern for primary lung cancer elected to proceed with a lobectomy.  Operative Technique: After the risks, benefits and  alternatives were thoroughly discussed, the patient was brought to the operative theatre.  Anesthesia was induced, and the patient was then placed in a left lateral decubitus position and was prepped and draped in normal sterile fashion.  An appropriate surgical pause was performed, and pre-operative antibiotics were dosed accordingly.  We began by placing our 4 robotic ports in the the 7th intercostal space targeting the hilum of the lung.  A 61mm assistant port was placed in the 9th intercostal space in the anterior axillary line.  The robot was then docked and all instruments were passed under direct visualization.    The firefly was activated and the navigational marking was evident in the right upper lobe.  A wedge resection was performed between several fires of the robotic stapler.  It was then passed into an Endo Catch bag and removed from the anteriormost port.    While we waited on the results from the pathologist we began to mobilize the hilum.  The lung was then retracted superiorly, and the inferior pulmonary ligament was divided.  The hilum was mobilized anteriorly and posteriorly.  We identified the upper lobe pulmonary vein, and after careful isolation, it was divided with a vascular stapler.  We next moved to the truncal branches.  The arteries were then divided sequentially with a vascular load stapler.  We then mobilized the fissure and identified the recurrent artery.  This was also divided with an Endo GIA stapler.  The bronchus to the upper lobe was  then isolated.  After a test clamp, with good ventilation of the middle and lower lobes, the bronchus was then divided.  The fissure was completed, and the specimen was passed into an endocatch bag.  It was removed from the anterior access site.    Lymph nodes were then sampled at levels 7, 8, and 9.  The chest was irrigated, and an air leak test was performed.  An intercostal nerve block was performed under direct visualization.  A 28 F chest  with then placed, and we watch the remaining lobes re-expand.  The skin and soft tissue were closed with absorbable suture    The patient tolerated the procedure without any immediate complications, and was transferred to the PACU in stable condition.  Lurlene Ronda Bary Leriche

## 2021-01-20 NOTE — Op Note (Signed)
Video Bronchoscopy with Robotic Assisted Bronchoscopic Navigation   Date of Operation: 01/20/2021   Pre-op Diagnosis: Right upper lobe lung nodule  Post-op Diagnosis: Right upper lobe lung nodule  Surgeon: Garner Nash, DO   Assistants: None   Anesthesia: General endotracheal anesthesia  Operation: Flexible video fiberoptic bronchoscopy with robotic assistance and biopsies.  Estimated Blood Loss: Minimal  Complications: None  Indications and History: Lee Leblanc is a 75 y.o. male with history of right upper lobe pulmonary nodule. The risks, benefits, complications, treatment options and expected outcomes were discussed with the patient.  The possibilities of pneumothorax, pneumonia, reaction to medication, pulmonary aspiration, perforation of a viscus, bleeding, failure to diagnose a condition and creating a complication requiring transfusion or operation were discussed with the patient who freely signed the consent.    Description of Procedure: The patient was seen in the Preoperative Area, was examined and was deemed appropriate to proceed.  The patient was taken to [facility] endoscopy room 3, identified as Lee Leblanc and the procedure verified as Flexible Video Fiberoptic Bronchoscopy.  A Time Out was held and the above information confirmed.   Prior to the date of the procedure a high-resolution CT scan of the chest was performed. Utilizing ION software program a virtual tracheobronchial tree was generated to allow the creation of distinct navigation pathways to the patient's parenchymal abnormalities. After being taken to the operating room general anesthesia was initiated and the patient  was orally intubated. The video fiberoptic bronchoscope was introduced via the endotracheal tube and a general inspection was performed which showed normal right and left lung anatomy, aspiration of the bilateral mainstems was completed to remove any remaining secretions. Robotic catheter  inserted into patient's endotracheal tube.   Target #1 right upper lobe: The distinct navigation pathways prepared prior to this procedure were then utilized to navigate to patient's lesion identified on CT scan. The robotic catheter was secured into place and the vision probe was withdrawn.  Lesion location was approximated using fluoroscopy, radial endobronchial ultrasound and three-dimensional cone beam CT imaging for peripheral targeting. Under fluoroscopic guidance transbronchial needle brushings, transbronchial needle biopsies, and transbronchial forceps biopsies were performed to be sent for cytology and pathology.  Following tissue sampling a transbronchial needle loaded with 50% / 50% mixture of ICG plus methylene blue was placed within the lesion under fluoroscopy and 1 cc of solution was injected for fiducial dye marking.  At the end of the procedure a general airway inspection was performed and there was no evidence of active bleeding. The bronchoscope was removed.  The patient tolerated the procedure well. There was no significant blood loss and there were no obvious complications. A post-procedural chest x-ray is pending.  Samples Target #1: 1. Transbronchial needle brushings from RUL 2. Transbronchial Wang needle biopsies from RUL 3. Transbronchial forceps biopsies from RUL  Plans:  The was transferred under the care of anesthesia and Dr. Kipp Brood to operating room 10 for wedge resection and possible lobectomy.  Garner Nash, DO [facility] 01/20/2021 8:42 AM

## 2021-01-20 NOTE — Brief Op Note (Signed)
01/20/2021  12:09 PM  PATIENT:  Enedina Finner  75 y.o. male  PRE-OPERATIVE DIAGNOSIS:  PULMONARY NODULE  POST-OPERATIVE DIAGNOSIS:  PULMONARY NODULE  PROCEDURE:  Procedure(s): XI ROBOTIC ASSISTED THORASCOPY-RIGHT UPPER LOBE WEDGE RESECTION AND LOBECTOMY (Right) INTERCOSTAL NERVE BLOCK (Right) LYMPH NODE DISSECTION (Right)  SURGEON:  Surgeon(s) and Role:    * Lightfoot, Lucile Crater, MD - Primary  PHYSICIAN ASSISTANT: Denica Web PA-C  ANESTHESIA:   general  EBL:  150 mL   BLOOD ADMINISTERED:none  DRAINS:  1  Chest Tube(s) in the RIGHT HEMITHORAX    LOCAL MEDICATIONS USED:   EXPAREL  SPECIMEN:  Source of Specimen:  PLEURAL PLAQUE, RUL WEDGE AND COMPLETION LOBECTOMY  DISPOSITION OF SPECIMEN:  PATHOLOGY  COUNTS:  YES  TOURNIQUET:  * No tourniquets in log *  DICTATION: .Dragon Dictation  PLAN OF CARE: Admit to inpatient   PATIENT DISPOSITION:  PACU - hemodynamically stable.   Delay start of Pharmacological VTE agent (>24hrs) due to surgical blood loss or risk of bleeding: no  COMPLICATIONS: NO KNOWN

## 2021-01-20 NOTE — H&P (Signed)
Synopsis: Referred in August 2022 for lung nodule by Wonda Olds, MD   Subjective:    PATIENT ID: Lee Leblanc GENDER: male DOB: 11-29-1945, MRN: [id number]       Chief Complaint  Patient presents with   Consult      Pt is here for a consult due to lung mass.  Pt states he had a CT performed which showed a spot on the lung which has increased in size. States that he does have complaints of SOB with exertion.        This is a 75 year old gentleman, COPD, GERD, hypertension.  Referred for abnormal CT imaging.  Seen in the cardiothoracic surgery office for aneurysm surveillance.  He is a 75 year old with a 50-pack-year history of smoking.  Previously followed by Dr. Lucianne Lei trite and seen again in follow-up by Dr. 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  Incidentally was found to have a right lung nodule.  Patient CT imaging was completed on 09/02/2020.  This revealed a progressive nonsolid nodule subsolid in nature for the posterior right upper lobe areas of groundglass suspicious for a low-grade indolent neoplasm.   OV 09/29/2020: Here today to review the CT imaging.  We discussed this today in the office.  Patient has no significant complaints at this time.  OV 01/20/2021: Here for planned outpatient RAB with fiducial dye marking and RAT by Dr. Kipp Brood.          Past Medical History:  Diagnosis Date   COPD (chronic obstructive pulmonary disease) (HCC)      has not used albuterol inhaler for last 2 months   Depression     GERD (gastroesophageal reflux disease)     Hypertension             Family History  Problem Relation Age of Onset   Allergies Daughter     Allergies Son     Leukemia Sister     Prostate cancer Father             Past Surgical History:  Procedure Laterality Date   BACK SURGERY     lasdt [phone]    lower x 7   COLONOSCOPY WITH PROPOFOL N/A 06/25/2012    Procedure: COLONOSCOPY WITH PROPOFOL;  Surgeon: Garlan Fair, MD;  Location: [facility];   Service: Endoscopy;  Laterality: N/A;   ESOPHAGOGASTRODUODENOSCOPY (EGD) WITH PROPOFOL N/A 06/25/2012    Procedure: ESOPHAGOGASTRODUODENOSCOPY (EGD) WITH PROPOFOL;  Surgeon: Garlan Fair, MD;  Location: [facility];  Service: Endoscopy;  Laterality: N/A;   EYE SURGERY Right     SHOULDER ARTHROSCOPY Right        Social History         Socioeconomic History   Marital status: Married      Spouse name: Not on file   Number of children: Not on file   Years of education: Not on file   Highest education level: Not on file  Occupational History   Occupation: Retired  Tobacco Use   Smoking status: Former      Packs/day: 0.50      Years: 50.00      Pack years: 25.00      Types: Cigarettes      Start date: 02/06/1961      Quit date: 02/07/2011      Years since quitting: 9.6   Smokeless tobacco: Never  Substance and Sexual Activity   Alcohol use: Yes      Alcohol/week: 0.0 standard drinks  Comment: occasional   Drug use: No   Sexual activity: Not on file  Other Topics Concern   Not on file  Social History Narrative   Not on file    Social Determinants of Health    Financial Resource Strain: Not on file  Food Insecurity: Not on file  Transportation Needs: Not on file  Physical Activity: Not on file  Stress: Not on file  Social Connections: Not on file  Intimate Partner Violence: Not on file      No Known Allergies          Outpatient Medications Prior to Visit  Medication Sig Dispense Refill   amLODipine (NORVASC) 5 MG tablet Take 5 mg by mouth daily.       cetirizine (ZYRTEC) 10 MG tablet Take 10 mg by mouth daily.       citalopram (CELEXA) 20 MG tablet Take 20 mg by mouth daily.       dextromethorphan-guaiFENesin (MUCINEX DM) 30-600 MG 12hr tablet Take 1 tablet by mouth 2 (two) times daily.       fluticasone (FLONASE) 50 MCG/ACT nasal spray Place 1 spray into both nostrils daily.       HYDROcodone-acetaminophen (NORCO/VICODIN) 5-325 MG tablet Take by mouth.        losartan-hydrochlorothiazide (HYZAAR) 100-25 MG per tablet Take 1 tablet by mouth daily.        montelukast (SINGULAIR) 10 MG tablet Take 10 mg by mouth at bedtime.       traZODone (DESYREL) 50 MG tablet Take 50 mg by mouth at bedtime.       aspirin 81 MG tablet Take 81 mg by mouth daily. (Patient not taking: Reported on 09/02/2020)       cyclobenzaprine (FLEXERIL) 5 MG tablet Take 2 tablets (10 mg total) by mouth 3 (three) times daily as needed for muscle spasms. (Patient not taking: Reported on 09/02/2020) 60 tablet 0   oxyCODONE 10 MG TABS Take 1 tablet (10 mg total) by mouth every 3 (three) hours as needed for severe pain ((score 7 to 10)). (Patient not taking: Reported on 09/02/2020) 30 tablet 0   pantoprazole (PROTONIX) 40 MG tablet Take 40 mg by mouth daily. PRN (Patient not taking: Reported on 09/02/2020)       senna-docusate (SENOKOT-S) 8.6-50 MG tablet Take 1 tablet by mouth at bedtime as needed for mild constipation. (Patient not taking: Reported on 09/02/2020) 30 tablet 1    No facility-administered medications prior to visit.    Review of Systems  Constitutional:  Negative for chills, fever, malaise/fatigue and weight loss.  HENT:  Negative for hearing loss, sore throat and tinnitus.   Eyes:  Negative for blurred vision and double vision.  Respiratory:  Negative for cough, hemoptysis, sputum production, shortness of breath, wheezing and stridor.   Cardiovascular:  Negative for chest pain, palpitations, orthopnea, leg swelling and PND.  Gastrointestinal:  Negative for abdominal pain, constipation, diarrhea, heartburn, nausea and vomiting.  Genitourinary:  Negative for dysuria, hematuria and urgency.  Musculoskeletal:  Negative for joint pain and myalgias.  Skin:  Negative for itching and rash.  Neurological:  Negative for dizziness, tingling, weakness and headaches.  Endo/Heme/Allergies:  Negative for environmental allergies. Does not bruise/bleed easily.  Psychiatric/Behavioral:   Negative for depression. The patient is not nervous/anxious and does not have insomnia.   All other systems reviewed and are negative.      Objective:   General appearance: 75 y.o., male, male, NAD, conversant  Eyes: anicteric sclerae, moist conjunctivae;  no lid-lag; PERRLA, tracking appropriately HENT: NCAT; oropharynx, MMM, no mucosal ulcerations; normal hard and soft palate Neck: Trachea midline; FROM, supple, lymphadenopathy, no JVD Lungs: CTAB, no crackles, no wheeze, with normal respiratory effort and no intercostal retractions CV: RRR, S1, S2, no MRGs  Abdomen: Soft, non-tender; non-distended, BS present  Extremities: No peripheral edema, radial and DP pulses present bilaterally  Skin: Normal temperature, turgor and texture; no rash Psych: Appropriate affect Neuro: Alert and oriented to person and place, no focal deficit           Vitals:    09/29/20 1127  BP: 112/70  Pulse: 68  Temp: 97.7 F (36.5 C)  TempSrc: Oral  SpO2: 98%  Weight: 218 lb 9.6 oz (99.2 kg)  Height: 6\' 2"  (1.88 m)    98% on RA    BMI Readings from Last 3 Encounters:  09/29/20 28.07 kg/m  09/02/20 27.48 kg/m  09/25/19 28.37 kg/m       Wt Readings from Last 3 Encounters:  09/29/20 218 lb 9.6 oz (99.2 kg)  09/02/20 214 lb (97.1 kg)  09/25/19 221 lb (100.2 kg)        CBC Labs (Brief)          Component Value Date/Time    WBC 6.1 09/17/2019 0916    RBC 5.25 09/17/2019 0916    HGB 12.9 (L) 09/25/2019 1755    HCT 38.0 (L) 09/25/2019 1755    PLT 330 09/17/2019 0916    MCV 89.0 09/17/2019 0916    MCH 30.5 09/17/2019 0916    MCHC 34.3 09/17/2019 0916    RDW 14.0 09/17/2019 0916        Chest Imaging:   09/02/2020 CT chest: Right upper lobe posterior groundglass lesion concerning for an indolent low-grade neoplasm. The patient's images have been independently reviewed by me.     Pulmonary Functions Testing Results: PFT Results Latest Ref Rng & Units 06/13/2013  FVC-Pre L 4.43   FVC-Predicted Pre % 102  FVC-Post L 4.38  FVC-Predicted Post % 101  Pre FEV1/FVC % % 74  Post FEV1/FCV % % 79  FEV1-Pre L 3.29  FEV1-Predicted Pre % 99  FEV1-Post L 3.48  DLCO uncorrected ml/min/mmHg 24.67  DLCO UNC% % 67  DLCO corrected ml/min/mmHg 24.67  DLCO COR %Predicted % 67  DLVA Predicted % 71  TLC L 7.51  TLC % Predicted % 98  RV % Predicted % 112      FeNO: no   Pathology: no   Echocardiogram: no   Heart Catheterization: no    Assessment & Plan:        ICD-10-CM    1. Nodule of upper lobe of right lung  R91.1 NM PET Image Initial (PI) Skull Base To Thigh (F-18 FDG)      Ambulatory referral to Pulmonology      Procedural/ Surgical Case Request: VIDEO BRONCHOSCOPY WITH ENDOBRONCHIAL NAVIGATION     2. Ground glass opacity present on imaging of lung  R91.8       3. Former smoker  Z87.891       4. Centrilobular emphysema ([place])  J43.2       5. Calcified pleural plaque due to asbestos exposure  J92.0           Discussion: This is a 75 year old former smoker, quit 5 years ago, had an incidentally found right upper lobe subsolid groundglass lesion concerning for an indolent carcinoma.  Additionally has a right lower lobe rounded somewhat bilobed nodule adjacent  to pleural plaque.   Plan: Here today for planned RAB + RATS combination procedure Discussed all risks benefits and alternatives  Patient is agreeable to proceed.   [facility] 01/20/2021 7:17 AM

## 2021-01-20 NOTE — Interval H&P Note (Signed)
History and Physical Interval Note:  01/20/2021 7:19 AM  Lee Leblanc  has presented today for surgery, with the diagnosis of lung nodule.  The various methods of treatment have been discussed with the patient and family. After consideration of risks, benefits and other options for treatment, the patient has consented to  Procedure(s): ROBOTIC ASSISTED NAVIGATIONAL BRONCHOSCOPY (N/A) as a surgical intervention.  The patient's history has been reviewed, patient examined, no change in status, stable for surgery.  I have reviewed the patient's chart and labs.  Questions were answered to the patient's satisfaction.     Lynn

## 2021-01-20 NOTE — Hospital Course (Addendum)
History of Present Illness:   At time of surgical consultation Lee Leblanc 75 y.o. male referred for surgical evaluation of a right upper lobe groundglass opacity.  This was found incidentally on cross-sectional imaging for surveillance of his ascending aortic aneurysm.  He has undergone a navigational bronchoscopy on 11/26/2020 which was nondiagnostic.  He does occasionally have some exertional dyspnea.  He also complains of some dysphagia but has a known history of reflux.  He recently underwent back surgery and has had some joint pain from this.  His weight has been stable.  He denies any fatigue.  The patient and all relevant studies were reviewed by Dr. Kipp Brood who recommended proceeding with biopsy and possible lobectomy.  Hospital course:  The patient was admitted electively and taken to the operating room and underwent flexible video fiberoptic bronchoscopy with robotic assistance of biopsies.  Findings of this were inconclusive and it was felt he should proceed with wedge resection and possible lobectomy.  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  Dr. Kipp Brood elected to proceed with right upper lobectomy which was performed with lymph node sampling.  He tolerated the procedure well was taken to the postanesthesia care unit in stable condition.    Postoperative hospital course:  He has remained hemodynamically stable.  Oxygen is being weaned over time.  He maintains good saturations on postop day 1 with 2 L nasal cannula.  On postop day 1 his chest tube had an air leak and was kept in place.  Chest x-ray showed a tiny apical pneumothorax with clear lung fields.  He has had only minimal drainage.  He does have an expected acute blood loss anemia which is minor.  His postop day #1 creatinine was increased and Toradol was discontinued at that time.

## 2021-01-21 ENCOUNTER — Inpatient Hospital Stay (HOSPITAL_COMMUNITY): Payer: Medicare HMO

## 2021-01-21 ENCOUNTER — Encounter (HOSPITAL_COMMUNITY): Payer: Self-pay | Admitting: Thoracic Surgery (Cardiothoracic Vascular Surgery)

## 2021-01-21 LAB — CBC
HCT: 37.6 % — ABNORMAL LOW (ref 39.0–52.0)
Hemoglobin: 13.4 g/dL (ref 13.0–17.0)
MCH: 31.5 pg (ref 26.0–34.0)
MCHC: 35.6 g/dL (ref 30.0–36.0)
MCV: 88.3 fL (ref 80.0–100.0)
Platelets: 286 10*3/uL (ref 150–400)
RBC: 4.26 MIL/uL (ref 4.22–5.81)
RDW: 13.5 % (ref 11.5–15.5)
WBC: 12.1 10*3/uL — ABNORMAL HIGH (ref 4.0–10.5)
nRBC: 0 % (ref 0.0–0.2)

## 2021-01-21 LAB — BASIC METABOLIC PANEL
Anion gap: 9 (ref 5–15)
BUN: 10 mg/dL (ref 8–23)
CO2: 24 mmol/L (ref 22–32)
Calcium: 8.6 mg/dL — ABNORMAL LOW (ref 8.9–10.3)
Chloride: 100 mmol/L (ref 98–111)
Creatinine, Ser: 1.45 mg/dL — ABNORMAL HIGH (ref 0.61–1.24)
GFR, Estimated: 50 mL/min — ABNORMAL LOW (ref >60)
Glucose, Bld: 201 mg/dL — ABNORMAL HIGH (ref 70–99)
Potassium: 3.5 mmol/L (ref 3.5–5.1)
Sodium: 133 mmol/L — ABNORMAL LOW (ref 135–145)

## 2021-01-21 LAB — CYTOLOGY - NON PAP

## 2021-01-21 LAB — SURGICAL PATHOLOGY

## 2021-01-21 MED ORDER — OXYCODONE HCL 5 MG PO TABS
5.0000 mg | ORAL_TABLET | Freq: Four times a day (QID) | ORAL | Status: DC | PRN
  Administered 2021-01-21: 5 mg via ORAL
  Filled 2021-01-21 (×2): qty 1

## 2021-01-21 MED ORDER — POTASSIUM CHLORIDE CRYS ER 20 MEQ PO TBCR
30.0000 meq | EXTENDED_RELEASE_TABLET | Freq: Once | ORAL | Status: AC
  Administered 2021-01-21: 30 meq via ORAL
  Filled 2021-01-21: qty 1

## 2021-01-21 MED ORDER — OXYCODONE HCL 5 MG PO TABS: 5.0000 mg | ORAL_TABLET | Freq: Four times a day (QID) | ORAL | 0 refills | Status: DC | PRN

## 2021-01-21 NOTE — Plan of Care (Signed)

## 2021-01-21 NOTE — Care Management (Addendum)
Potential code 44, Spoke with RN, paged Quality MD for direction.  He reviewed the chart, but had this RNCM call Dr Doy Hutching for further direction. Left a message with Dr. Doy Hutching to return call.  1950 no call back from Dr Doy Hutching, no UR on call. Patient DC

## 2021-01-21 NOTE — Progress Notes (Signed)
[address]       [place] [address]             [phone]      1 Day Post-Op Procedure(s) (LRB): ROBOTIC ASSISTED NAVIGATIONAL BRONCHOSCOPY (N/A) FIDUCIAL DYE MARKING BRONCHIAL BRUSHINGS BRONCHIAL BIOPSIES Subjective: Some pain but not too bad, mostly his chronic back issues  Objective: Vital signs in last 24 hours: Temp:  [97.1 F (36.2 C)-99 F (37.2 C)] 98.2 F (36.8 C) (12/16 0318) Pulse Rate:  [67-86] 67 (12/16 0318) Cardiac Rhythm: Normal sinus rhythm (12/15 2100) Resp:  [10-21] 17 (12/16 0318) BP: (96-141)/(61-98) 112/61 (12/16 0318) SpO2:  [93 %-97 %] 94 % (12/16 0318) Arterial Line BP: (130-170)/(59-81) 168/79 (12/15 1515)  Hemodynamic parameters for last 24 hours:    Intake/Output from previous day: 12/15 0701 - 12/16 0700 In: 2860 [P.O.:360; I.V.:2400; IV Piggyback:100] Out: 1915 [Urine:1725; Blood:150; Chest Tube:40] Intake/Output this shift: No intake/output data recorded.  General appearance: alert, cooperative, and no distress Heart: regular rate and rhythm Lungs: scattered minor rochi Abdomen: benign Extremities: extremities normal, atraumatic, no cyanosis or edema, edema non tender, no edema, redness or tenderness in the calves or thighs, and mild chronic numbness Wound: healing well  Lab Results: Recent Labs    01/18/21 1317 01/21/21 0039  WBC 4.6 12.1*  HGB 15.8 13.4  HCT 45.0 37.6*  PLT 344 286   BMET:  Recent Labs    01/18/21 1317 01/21/21 0039  NA 135 133*  K 3.6 3.5  CL 103 100  CO2 21* 24  GLUCOSE 133* 201*  BUN 10 10  CREATININE 1.29* 1.45*  CALCIUM 9.6 8.6*    PT/INR:  Recent Labs    01/18/21 1317  LABPROT 13.0  INR 1.0   ABG    Component Value Date/Time   PHART 7.425 01/18/2021 1330   HCO3 23.6 01/18/2021 1330   TCO2 24 09/25/2019 1755   ACIDBASEDEF 0.2 01/18/2021 1330   O2SAT 98.7 01/18/2021 1330   CBG (last 3)  No results for input(s): GLUCAP in the last 72  hours.  Meds Scheduled Meds:  acetaminophen  1,000 mg Oral Q6H   Or   acetaminophen (TYLENOL) oral liquid 160 mg/5 mL  1,000 mg Oral Q6H   amLODipine  5 mg Oral Daily   bisacodyl  10 mg Oral Daily   citalopram  20 mg Oral Daily   enoxaparin (LOVENOX) injection  40 mg Subcutaneous Daily   fluticasone  1 spray Each Nare Daily   losartan  100 mg Oral Daily   And   hydrochlorothiazide  25 mg Oral Daily   ketorolac  15 mg Intravenous Q6H   loratadine  10 mg Oral Daily   montelukast  10 mg Oral QHS   pantoprazole  40 mg Oral Daily   senna-docusate  1 tablet Oral QHS   traZODone  50-100 mg Oral QHS   Continuous Infusions: PRN Meds:.dextromethorphan-guaiFENesin, ondansetron (ZOFRAN) IV, polyvinyl alcohol, traMADol  Xrays DG Chest Port 1 View  Result Date: 01/20/2021 CLINICAL DATA:  Status post right upper lobe resection EXAM: PORTABLE CHEST 1 VIEW COMPARISON:  01/18/2021 FINDINGS: Single frontal view of the chest demonstrates interval right upper lobe resection, with associated volume loss and mediastinal shift to the right. Right chest tube identified tip at the right apex. There is a trace right apical pneumothorax. Volume estimated less than 5%. Left chest is clear. Stable pleural calcifications again noted. No acute bony abnormalities. IMPRESSION: 1. Postsurgical changes from  right upper lobectomy. 2. Trace right apical pneumothorax, with indwelling right chest tube. 3. Stable pleural calcifications. Electronically Signed   By: Randa Ngo M.D.   On: 01/20/2021 15:09   DG C-ARM BRONCHOSCOPY  Result Date: 01/20/2021 C-ARM BRONCHOSCOPY: Fluoroscopy was utilized by the requesting physician.  No radiographic interpretation.    Assessment/Plan: S/P Procedure(s) (LRB): ROBOTIC ASSISTED NAVIGATIONAL BRONCHOSCOPY (N/A) FIDUCIAL DYE MARKING BRONCHIAL BRUSHINGS BRONCHIAL BIOPSIES  POD#`1  1 afeb, VSS s BP 90's -140's, sinus rhythm 2 sats good on 2 liters [place] 3 CT 40 cc recorded, +  air leak but hard to tell how big as container not completely full( water chamber)- keep for now 4 CXR- ? Tiny apical pntx, clear lung fields 5 minor leukocytosis- monitor 6 minor expected ABLA - monitor 7 good UOP 8 creat up a bit- d/c toradol, will add low dose oxy  9 replace K+ for 3.5 10 routine pulm hygiene , rehab modalities     LOS: 1 day    John Giovanni PA-C Pager [phone] 01/21/2021

## 2021-01-21 NOTE — Discharge Summary (Signed)
Physician Discharge Summary  Patient ID: Lee Leblanc MRN: [id number] DOB/AGE: 04/13/1945 75 y.o.  Admit date: 01/20/2021 Discharge date: 01/24/2021  Admission Diagnoses:  Discharge Diagnoses:  Principal Problem:   S/P lobectomy of lung Active Problems:   Lung nodule  Patient Active Problem List   Diagnosis Date Noted   S/P lobectomy of lung 01/20/2021   Lung nodule    Spondylolisthesis of lumbar region 09/25/2019   Thoracic aortic aneurysm 03/05/2019   Acute pericarditis by MRI 05/27/2014   Renal insufficiency, mild (GFR 63) 05/27/2014   Essential hypertension 05/27/2014   COPD with emphysema ([place]) 05/27/2014   Chest pain 05/26/2014   Solitary pulmonary nodule 01/15/2014   Pleural plaque without asbestosis 01/15/2014   Dyspnea 01/13/2014    Discharged Condition: good History of Present Illness:   At time of surgical consultation Lee Leblanc 75 y.o. male referred for surgical evaluation of a right upper lobe groundglass opacity.  This was found incidentally on cross-sectional imaging for surveillance of his ascending aortic aneurysm.  He has undergone a navigational bronchoscopy on 11/26/2020 which was nondiagnostic.  He does occasionally have some exertional dyspnea.  He also complains of some dysphagia but has a known history of reflux.  He recently underwent back surgery and has had some joint pain from this.  His weight has been stable.  He denies any fatigue.  The patient and all relevant studies were reviewed by Dr. Kipp Brood who recommended proceeding with biopsy and possible lobectomy.  Hospital course:  The patient was admitted electively and taken to the operating room and underwent flexible video fiberoptic bronchoscopy with robotic assistance of biopsies.  Findings of this were inconclusive and it was felt he should proceed with wedge resection and possible lobectomy.  The initial wedge resection was nonconfirmational but did show findings of possible  bronchioloalveolar carcinoma.  Dr. Kipp Brood elected to proceed with right upper lobectomy which was performed with lymph node sampling.  He tolerated the procedure well was taken to the postanesthesia care unit in stable condition.    Postoperative hospital course:  He has remained hemodynamically stable.  Oxygen is being weaned over time.  He maintains good saturations on postop day 1 with 2 L nasal cannula.  On postop day 1 his chest tube had an air leak and was kept in place.  Chest x-ray showed a tiny apical pneumothorax with clear lung fields.  He has had only minimal drainage.  He does have an expected acute blood loss anemia which is minor.  His postop day #1 creatinine was increased and Toradol was discontinued at that time.  The patient progressed well throughout postop day 1 and it was Dr. Abran Duke opinion that the patient was stable for discharge.  Consults: None  Significant Diagnostic Studies:  DG Chest 1 View  Result Date: 01/21/2021 CLINICAL DATA:  Post chest tube removal EXAM: CHEST  1 VIEW COMPARISON:  01/21/2021 FINDINGS: Right chest tube removed. Proximally 3 cm pneumothorax in the right apex. No pleural effusion identified. Surgical staple line right upper lobe Nodular densities overlying the left chest compatible with calcified pleural plaque. Calcified pleural plaque on the diaphragm on the left. Mild cardiac enlargement. Negative for heart failure or edema. No pleural effusion. IMPRESSION: 3 cm pneumothorax right apex following right chest tube removal. Electronically Signed   By: Franchot Gallo M.D.   On: 01/21/2021 15:18   DG Chest 2 View  Result Date: 01/19/2021 CLINICAL DATA:  Preoperative, bronchoscopy planning EXAM: CHEST - 2 VIEW  COMPARISON:  01/05/2021 FINDINGS: The heart size and mediastinal contours are within normal limits. No acute airspace opacity. Fiducial marker in the right upper lobe. Bilateral pleural calcifications. The visualized skeletal structures  are unremarkable. IMPRESSION: No acute abnormality of the lungs. Fiducial marker in the right upper lobe. Electronically Signed   By: Delanna Ahmadi M.D.   On: 01/19/2021 13:58   DG Chest 2 View  Result Date: 01/06/2021 CLINICAL DATA:  History of COPD.  Lung nodules EXAM: CHEST - 2 VIEW COMPARISON:  Chest CT 01/03/2021 FINDINGS: The cardiomediastinal silhouette is stable. The lungs hyperinflated consistent with COPD. Ill-defined densities projecting over the left lung likely reflect pleural calcifications as seen on prior chest CT. A metallic density projecting over the right upper lobe corresponds to the density seen the chest CT from 01/03/2021. The cavitary lesion adjacent to the density is not well assessed. The additional nodule in the superior segment of the right lower lobe is also not well seen. There is no new or worsening focal airspace disease. There is no pleural effusion or pneumothorax. There is no acute osseous abnormality. IMPRESSION: 1. COPD.  No radiographic evidence of acute cardiopulmonary process. 2. The known cavitary lesion in the right upper lobe is not well seen on the current study. Electronically Signed   By: Valetta Mole M.D.   On: 01/06/2021 08:44   DG Chest Port 1 View  Result Date: 01/21/2021 CLINICAL DATA:  Chest tube, post lobectomy, sore chest, COPD EXAM: PORTABLE CHEST 1 VIEW COMPARISON:  Portable exam 0633 hours compared to 01/20/2021 FINDINGS: RIGHT thoracostomy tube present, unchanged. Normal heart size, mediastinal contours, and pulmonary vascularity. Patchy opacities in the LEFT hemithorax unchanged, correspond to calcified pleural plaques on a remote CT. No acute infiltrate or pleural effusion. Small RIGHT apical pneumothorax unchanged. IMPRESSION: Persistent small RIGHT apex pneumothorax. Calcified pleural plaques LEFT chest. Electronically Signed   By: Lavonia Dana M.D.   On: 01/21/2021 09:39   DG Chest Port 1 View  Result Date: 01/20/2021 CLINICAL DATA:  Status  post right upper lobe resection EXAM: PORTABLE CHEST 1 VIEW COMPARISON:  01/18/2021 FINDINGS: Single frontal view of the chest demonstrates interval right upper lobe resection, with associated volume loss and mediastinal shift to the right. Right chest tube identified tip at the right apex. There is a trace right apical pneumothorax. Volume estimated less than 5%. Left chest is clear. Stable pleural calcifications again noted. No acute bony abnormalities. IMPRESSION: 1. Postsurgical changes from right upper lobectomy. 2. Trace right apical pneumothorax, with indwelling right chest tube. 3. Stable pleural calcifications. Electronically Signed   By: Randa Ngo M.D.   On: 01/20/2021 15:09   CT Super D Chest Wo Contrast  Result Date: 01/03/2021 CLINICAL DATA:  Lung nodule. EXAM: CT CHEST WITHOUT CONTRAST TECHNIQUE: Multidetector CT imaging of the chest was performed using thin slice collimation for electromagnetic bronchoscopy planning purposes, without intravenous contrast. COMPARISON:  CT 44/02/270 mild metabolic com PET-CT 53/66/4403 FINDINGS: Cardiovascular: Coronary artery calcification and aortic atherosclerotic calcification. Mediastinum/Nodes: No axillary or supraclavicular adenopathy. No mediastinal or hilar adenopathy. No pericardial fluid. Esophagus normal. Lungs/Pleura: Multiple pleural plaques again demonstrated. Within the superior segment of the RIGHT lower lobe rounded nodule adjacent pleural surface measures 11 mm (image 74/5) compared to 10 mm comparison exam. Nodule stable over multiple comparison exams and non metabolic. Cavitary lesion with peripheral ground-glass density in the RIGHT upper lobe measures 21 mm (image 58/5) unchanged from 22 mm. This nodule has mild metabolic activity on  comparison PET-CT scan No new nodularity. Upper Abdomen: Limited view of the liver, kidneys, pancreas are unremarkable. Normal adrenal glands. Musculoskeletal: No aggressive osseous lesion. IMPRESSION: 1.  Persistent cavitary/ground-glass nodule in the RIGHT upper lobe. Indeterminate finding. 2. Benign nodule in the superior aspect RIGHT lower lobe. 3. Multiple calcified pleural plaques . Electronically Signed   By: Suzy Bouchard M.D.   On: 01/03/2021 15:31   DG C-ARM BRONCHOSCOPY  Result Date: 01/20/2021 C-ARM BRONCHOSCOPY: Fluoroscopy was utilized by the requesting physician.  No radiographic interpretation.      Treatments: surgery:  PROCEDURE:  Procedure(s): XI ROBOTIC ASSISTED THORASCOPY-RIGHT UPPER LOBE WEDGE RESECTION AND LOBECTOMY (Right) INTERCOSTAL NERVE BLOCK (Right) LYMPH NODE DISSECTION (Right)   Discharge Exam: Blood pressure 134/90, pulse 81, temperature 97.7 F (36.5 C), temperature source Oral, resp. rate 19, height 6\' 2"  (1.88 m), weight 98.9 kg, SpO2 95 %.  General appearance: alert, cooperative, and no distress Heart: regular rate and rhythm Lungs: scattered minor rochi Abdomen: benign Extremities: extremities normal, atraumatic, no cyanosis or edema, edema non tender, no edema, redness or tenderness in the calves or thighs, and mild chronic numbness Wound: healing well Disposition: Discharge disposition: 01-Home or Self Care        Allergies as of 01/21/2021   No Known Allergies      Medication List     STOP taking these medications    HYDROcodone-acetaminophen 5-325 MG tablet Commonly known as: NORCO/VICODIN       TAKE these medications    acetaminophen 500 MG tablet Commonly known as: TYLENOL Take 500 mg by mouth every 6 (six) hours as needed for moderate pain.   amLODipine 5 MG tablet Commonly known as: NORVASC Take 5 mg by mouth daily.   cetirizine 10 MG tablet Commonly known as: ZYRTEC Take 10 mg by mouth daily.   citalopram 20 MG tablet Commonly known as: CELEXA Take 20 mg by mouth daily.   dextromethorphan-guaiFENesin 30-600 MG 12hr tablet Commonly known as: MUCINEX DM Take 1 tablet by mouth 2 (two) times daily as  needed for cough.   diphenhydrAMINE 25 MG tablet Commonly known as: BENADRYL Take 25 mg by mouth daily as needed for allergies.   fluticasone 50 MCG/ACT nasal spray Commonly known as: FLONASE Place 1 spray into both nostrils daily.   losartan-hydrochlorothiazide 100-25 MG tablet Commonly known as: HYZAAR Take 1 tablet by mouth daily.   montelukast 10 MG tablet Commonly known as: SINGULAIR Take 10 mg by mouth at bedtime.   oxyCODONE 5 MG immediate release tablet Commonly known as: Oxy IR/ROXICODONE Take 1 tablet (5 mg total) by mouth every 6 (six) hours as needed for moderate pain.   pantoprazole 40 MG tablet Commonly known as: PROTONIX Take 40 mg by mouth daily.   simethicone 125 MG chewable tablet Commonly known as: MYLICON Chew 099 mg by mouth every 6 (six) hours as needed for flatulence.   Soothe 0.6-0.6 % Soln Generic drug: Propylene Glycol-Glycerin Place 1 drop into both eyes daily as needed (eye irritation).   traZODone 50 MG tablet Commonly known as: DESYREL Take 50-100 mg by mouth at bedtime.        Follow-up Information     Icard, Bradley L, DO. Schedule an appointment as soon as possible for a visit.   Specialty: Pulmonary Disease Contact information: [address] [place] [address] [phone]         Lajuana Matte, MD Follow up.   Specialty: Cardiothoracic Surgery Why: Please see  discharge paperwork for follow-up appointment with Dr. Kipp Brood.  On the date you see the surgeon obtain a chest x-ray at [facility] which is located in the same office complex on the first floor.  Please obtain this 1/2-hour prior to the appointment. Contact information: [place] [address] [phone]                 Signed: John Giovanni PA-C 01/24/2021, 12:55 PM

## 2021-01-21 NOTE — TOC Transition Note (Signed)
Transition of Care [facility]) - CM/SW Discharge Note   Patient Details  Name: Lee Leblanc MRN: [id number] Date of Birth: 1945/05/07  [facility]) CM/SW Contact:  Tresa Endo Phone Number: 01/21/2021, 2:46 PM   Clinical Narrative:     Transition of Care [facility]) Screening Note   Patient Details  Name: Lee Leblanc Date of Birth: 1945-03-18   Transition of Care [facility]) CM/SW Contact:    Reece Agar, Latanya Presser Phone Number: 01/21/2021, 2:47 PM    Transition of Care Department [facility]) has reviewed patient and no TOC needs have been identified at this time. We will continue to monitor patient advancement through interdisciplinary progression rounds. If new patient transition needs arise, please place a TOC consult.           Patient Goals and CMS Choice        Discharge Placement                       Discharge Plan and Services                                     Social Determinants of Health (SDOH) Interventions     Readmission Risk Interventions No flowsheet data found.

## 2021-01-22 NOTE — Anesthesia Postprocedure Evaluation (Signed)
Anesthesia Post Note  Patient: Lee Leblanc  Procedure(s) Performed: ROBOTIC ASSISTED NAVIGATIONAL BRONCHOSCOPY FIDUCIAL DYE MARKING BRONCHIAL BRUSHINGS BRONCHIAL BIOPSIES XI ROBOTIC ASSISTED THORASCOPY-RIGHT UPPER LOBE WEDGE RESECTION AND LOBECTOMY (Right: Chest) INTERCOSTAL NERVE BLOCK (Right: Chest) LYMPH NODE DISSECTION (Right: Chest)     Patient location during evaluation: PACU Anesthesia Type: General Level of consciousness: awake and alert Pain management: pain level controlled Vital Signs Assessment: post-procedure vital signs reviewed and stable Respiratory status: spontaneous breathing, nonlabored ventilation, respiratory function stable and patient connected to nasal cannula oxygen Cardiovascular status: blood pressure returned to baseline and stable Postop Assessment: no apparent nausea or vomiting Anesthetic complications: no   No notable events documented.  Last Vitals:  Vitals:   01/21/21 1109 01/21/21 1629  BP: 131/60 134/90  Pulse: 79 81  Resp: 20 19  Temp: 36.7 C 36.5 C  SpO2: 92% 95%    Last Pain:  Vitals:   01/21/21 1629  TempSrc: Oral  PainSc:                  [place]

## 2021-01-23 ENCOUNTER — Encounter (HOSPITAL_COMMUNITY): Payer: Self-pay | Admitting: Pulmonary Disease

## 2021-02-03 ENCOUNTER — Other Ambulatory Visit: Payer: Self-pay | Admitting: Internal Medicine

## 2021-02-03 ENCOUNTER — Ambulatory Visit
Admission: RE | Admit: 2021-02-03 | Discharge: 2021-02-03 | Disposition: A | Discharge status: Home / Self Care | Payer: Medicare HMO | Source: Ambulatory Visit | Attending: Internal Medicine | Admitting: Internal Medicine

## 2021-02-03 ENCOUNTER — Other Ambulatory Visit: Payer: Self-pay | Admitting: Thoracic Surgery (Cardiothoracic Vascular Surgery)

## 2021-02-03 ENCOUNTER — Ambulatory Visit
Admission: RE | Admit: 2021-02-03 | Discharge: 2021-02-03 | Disposition: A | Discharge status: Home / Self Care | Payer: Medicare HMO | Source: Ambulatory Visit | Attending: Thoracic Surgery (Cardiothoracic Vascular Surgery) | Admitting: Thoracic Surgery (Cardiothoracic Vascular Surgery)

## 2021-02-03 DIAGNOSIS — R911 Solitary pulmonary nodule: Secondary | ICD-10-CM

## 2021-02-03 DIAGNOSIS — J939 Pneumothorax, unspecified: Secondary | ICD-10-CM | POA: Diagnosis not present

## 2021-02-03 DIAGNOSIS — R1084 Generalized abdominal pain: Secondary | ICD-10-CM

## 2021-02-03 DIAGNOSIS — Z902 Acquired absence of lung [part of]: Secondary | ICD-10-CM | POA: Diagnosis not present

## 2021-02-03 DIAGNOSIS — J929 Pleural plaque without asbestos: Secondary | ICD-10-CM | POA: Diagnosis not present

## 2021-02-04 ENCOUNTER — Ambulatory Visit (INDEPENDENT_AMBULATORY_CARE_PROVIDER_SITE_OTHER): Payer: Self-pay | Admitting: Thoracic Surgery (Cardiothoracic Vascular Surgery)

## 2021-02-04 ENCOUNTER — Other Ambulatory Visit: Payer: Self-pay

## 2021-02-04 VITALS — BP 128/76 | HR 94 | Resp 20 | Ht 74.0 in | Wt 217.0 lb

## 2021-02-04 DIAGNOSIS — Z09 Encounter for follow-up examination after completed treatment for conditions other than malignant neoplasm: Secondary | ICD-10-CM

## 2021-02-04 MED ORDER — TRAMADOL HCL 50 MG PO TABS: 50.0000 mg | ORAL_TABLET | Freq: Four times a day (QID) | ORAL | 0 refills | Status: DC | PRN

## 2021-02-04 NOTE — Progress Notes (Signed)
[address]       [place] [address]             [phone]        Pedrohenrique B Livengood [place] Medical Record #[id number] Date of Birth: 12-Feb-1945  Referring: Garner Nash, DO Primary Care: Seward Carol, MD Primary Cardiologist:None  Reason for visit:   follow-up  History of Present Illness:     75 year old male presents for his 1 week follow-up appointment.  He underwent a right robotic assisted right upper lobectomy.  He only complains of some pain at the access incision site.  His respiratory status is stable.  Physical Exam: BP 128/76    Pulse 94    Resp 20    Ht 6\' 2"  (1.88 m)    Wt 217 lb (98.4 kg)    SpO2 98% Comment: RA   BMI 27.86 kg/m   Alert NAD Incision clean, stitch removed.   Abdomen soft, ND No peripheral edema   Diagnostic Studies & Laboratory data: CXR: Clear Path:  FINAL MICROSCOPIC DIAGNOSIS:   A. PLEURAL PLAQUE:  - Benign fibrosis, negative for carcinoma   B. 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 LYMPH NODE, 9, EXCISION:  - Lymph node, negative for carcinoma (0/1)   D. LYMPH NODE, 7, EXCISION:  - Lymph node, negative for carcinoma (0/1)   E. LYMPH NODE, HILAR, EXCISION:  - Lymph node, negative for carcinoma (0/1)   F. LUNG, RIGHT UPPER LOBE, LOBECTOMY:  - Focal lung adenocarcinoma, lepidic predominant, 0.4 cm  - Bronchovascular margins are negative for carcinoma  - Five lymph nodes, negative for carcinoma (0/5)      ONCOLOGY TABLE:   LUNG: Resection   Synchronous Tumors: Not applicable  Total Number of Primary Tumors: 1  Procedure: Lobectomy, lung  Specimen Laterality: Right  Tumor Focality: Unifocal  Tumor Site: Upper lobe  Tumor Size: No tumor identified grossly       Total Tumor Size: 1.3 cm (as measured  on HE glass slide)       Invasive Tumor Size (applies only to invasive nonmucinous  adenocarcinoma with a lepidic            component): Approximately 0.6 cm (as measured on HE glass  slide)  Histologic Type: Adenocarcinoma, lepidic predominant  Visceral Pleura Invasion: Not identified  Direct Invasion of Adjacent Structures: No adjacent structures present  Lymphovascular Invasion: Not identified  Margins: All margins negative for invasive carcinoma       Closest Margin(s) to Invasive Carcinoma: Bronchovascular margin       Margin(s) Involved by Invasive Carcinoma: Not applicable        Margin Status for Non-Invasive Tumor: Not involved  Treatment Effect: No known presurgical therapy  Regional Lymph Nodes:       Number of Lymph Nodes Involved: 0                            Nodal Sites with Tumor: Not applicable       Number of Lymph Nodes Examined: 8                       Nodal Sites Examined: Levels 7, 9 and 10  Distant Metastasis:       Distant Site(s) Involved: Not applicable  Pathologic Stage Classification (pTNM, AJCC 8th Edition): pT1a, pN0     Assessment / Plan:   75 year old male status post robotic assisted right upper lobectomy for T1 a N0 M0 stage I adenocarcinoma the lung.  Overall he is doing well.  I instructed him to use Tylenol scheduled, and gave him a prescription for tramadol.  I will see him back in 1 month with a chest x-ray.   Lajuana Matte 02/04/2021 12:06 PM

## 2021-02-11 ENCOUNTER — Other Ambulatory Visit: Payer: Self-pay | Admitting: Thoracic Surgery (Cardiothoracic Vascular Surgery)

## 2021-02-11 MED ORDER — OXYCODONE HCL 5 MG PO TABS: 5.0000 mg | ORAL_TABLET | Freq: Four times a day (QID) | ORAL | 0 refills | Status: DC | PRN

## 2021-02-18 ENCOUNTER — Encounter: Payer: Self-pay | Admitting: Pulmonary Disease

## 2021-02-18 ENCOUNTER — Ambulatory Visit: Payer: Medicare HMO | Admitting: Pulmonary Disease

## 2021-02-18 ENCOUNTER — Other Ambulatory Visit: Payer: Self-pay

## 2021-02-18 VITALS — BP 112/70 | HR 72 | Temp 97.6°F | Ht 74.0 in | Wt 218.4 lb

## 2021-02-18 DIAGNOSIS — Z902 Acquired absence of lung [part of]: Secondary | ICD-10-CM

## 2021-02-18 DIAGNOSIS — J432 Centrilobular emphysema: Secondary | ICD-10-CM

## 2021-02-18 DIAGNOSIS — C3411 Malignant neoplasm of upper lobe, right bronchus or lung: Secondary | ICD-10-CM | POA: Diagnosis not present

## 2021-02-18 DIAGNOSIS — J449 Chronic obstructive pulmonary disease, unspecified: Secondary | ICD-10-CM

## 2021-02-18 DIAGNOSIS — C3491 Malignant neoplasm of unspecified part of right bronchus or lung: Secondary | ICD-10-CM | POA: Diagnosis not present

## 2021-02-18 DIAGNOSIS — C3490 Malignant neoplasm of unspecified part of unspecified bronchus or lung: Secondary | ICD-10-CM

## 2021-02-18 MED ORDER — TRELEGY ELLIPTA 100-62.5-25 MCG/ACT IN AEPB: 1.0000 | INHALATION_SPRAY | Freq: Every day | RESPIRATORY_TRACT | 0 refills | Status: DC

## 2021-02-18 NOTE — Progress Notes (Signed)
I think  Synopsis: Referred in August 2022 for lung nodule by Seward Carol, MD  Subjective:   PATIENT ID: Lee Leblanc GENDER: male DOB: 05-09-1945, MRN: [id number]  Chief Complaint  Patient presents with   Follow-up    Follow up. Patient has no complaints.      This is a 76 year old gentleman, COPD, GERD, hypertension.  Referred for abnormal CT imaging.  Seen in the cardiothoracic surgery office for aneurysm surveillance.  He is a 76 year old with a 50-pack-year history of smoking.  Previously followed by Dr. Lucianne Lei trite and seen again in follow-up by Dr. 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  Incidentally was found to have a right lung nodule.  Patient CT imaging was completed on 09/02/2020.  This revealed a progressive nonsolid nodule subsolid in nature for the posterior right upper lobe areas of groundglass suspicious for a low-grade indolent neoplasm.  OV 09/29/2020: Here today to review the CT imaging.  We discussed this today in the office.  Patient has no significant complaints at this time.  OV 11/26/2020: Here today for follow-up after bronchoscopy.  All tissue samples from the upper lobe lesion following bronchoscopy were all negative for malignancy.  We reviewed this today in the office.  Presents with today discussion was patient's wife.  Reviewed images.  I explained that I still felt like this lesion within the upper lobe is likely represent a malignancy.  We discussed various options to include repeat biopsy or even consideration for resection.  OV 02/18/2021: Here today for follow-up after recent robotic assisted bronchoscopy, fiducial dye marking followed by robotic assisted thoracic surgery and lobectomy by Dr. Kipp Brood.  From respiratory standpoint doing okay still has some shortness of breath.  Does have evidence of centrilobular emphysema on previous imaging.   Past Medical History:  Diagnosis Date   COPD (chronic obstructive pulmonary disease) ([place])    has not used  albuterol inhaler for last 2 months   Depression    Dyspnea    GERD (gastroesophageal reflux disease)    Hypertension      Family History  Problem Relation Age of Onset   Allergies Daughter    Allergies Son    Leukemia Sister    Prostate cancer Father      Past Surgical History:  Procedure Laterality Date   BACK SURGERY    lasdt 1990's   lower x 7   BRONCHIAL BIOPSY  10/26/2020   Procedure: BRONCHIAL BIOPSIES;  Surgeon: Garner Nash, DO;  Location: [facility];  Service: Pulmonary;;   BRONCHIAL BIOPSY  01/20/2021   Procedure: BRONCHIAL BIOPSIES;  Surgeon: Garner Nash, DO;  Location: [facility];  Service: Pulmonary;;   BRONCHIAL BRUSHINGS  10/26/2020   Procedure: BRONCHIAL BRUSHINGS;  Surgeon: Garner Nash, DO;  Location: [facility];  Service: Pulmonary;;   BRONCHIAL BRUSHINGS  01/20/2021   Procedure: BRONCHIAL BRUSHINGS;  Surgeon: Garner Nash, DO;  Location: [facility];  Service: Pulmonary;;   BRONCHIAL NEEDLE ASPIRATION BIOPSY  10/26/2020   Procedure: BRONCHIAL NEEDLE ASPIRATION BIOPSIES;  Surgeon: Garner Nash, DO;  Location: [facility];  Service: Pulmonary;;   BRONCHIAL WASHINGS  10/26/2020   Procedure: BRONCHIAL WASHINGS;  Surgeon: Garner Nash, DO;  Location: [place];  Service: Pulmonary;;   COLONOSCOPY WITH PROPOFOL N/A 06/25/2012   Procedure: COLONOSCOPY WITH PROPOFOL;  Surgeon: Garlan Fair, MD;  Location: [facility];  Service: Endoscopy;  Laterality: N/A;   ESOPHAGOGASTRODUODENOSCOPY (EGD) WITH PROPOFOL N/A 06/25/2012   Procedure: ESOPHAGOGASTRODUODENOSCOPY (  EGD) WITH PROPOFOL;  Surgeon: Garlan Fair, MD;  Location: [facility];  Service: Endoscopy;  Laterality: N/A;   EYE SURGERY Right    FIDUCIAL MARKER PLACEMENT  10/26/2020   Procedure: FIDUCIAL MARKER PLACEMENT;  Surgeon: Garner Nash, DO;  Location: [facility];  Service: Pulmonary;;   FIDUCIAL MARKER PLACEMENT  01/20/2021   Procedure: FIDUCIAL DYE MARKING;   Surgeon: Garner Nash, DO;  Location: [facility];  Service: Pulmonary;;   INTERCOSTAL NERVE BLOCK Right 01/20/2021   Procedure: INTERCOSTAL NERVE BLOCK;  Surgeon: Lajuana Matte, MD;  Location: [place];  Service: Thoracic;  Laterality: Right;   LYMPH NODE DISSECTION Right 01/20/2021   Procedure: LYMPH NODE DISSECTION;  Surgeon: Lajuana Matte, MD;  Location: [facility];  Service: Thoracic;  Laterality: Right;   SHOULDER ARTHROSCOPY Right    VIDEO BRONCHOSCOPY WITH ENDOBRONCHIAL NAVIGATION N/A 10/26/2020   Procedure: VIDEO BRONCHOSCOPY WITH ENDOBRONCHIAL NAVIGATION;  Surgeon: Garner Nash, DO;  Location: [place];  Service: Pulmonary;  Laterality: N/A;  ION w/ fiducial placement   VIDEO BRONCHOSCOPY WITH RADIAL ENDOBRONCHIAL ULTRASOUND  10/26/2020   Procedure: VIDEO BRONCHOSCOPY WITH RADIAL ENDOBRONCHIAL ULTRASOUND;  Surgeon: Garner Nash, DO;  Location: [facility];  Service: Pulmonary;;    Social History   Socioeconomic History   Marital status: Married    Spouse name: Beckie   Number of children: 6   Years of education: Not on file   Highest education level: Not on file  Occupational History   Occupation: Retired  Tobacco Use   Smoking status: Former    Packs/day: 0.50    Years: 50.00    Pack years: 25.00    Types: Cigarettes    Start date: 02/06/1961    Quit date: 02/07/2011    Years since quitting: 10.0   Smokeless tobacco: Never  Vaping Use   Vaping Use: Never used  Substance and Sexual Activity   Alcohol use: Yes    Alcohol/week: 0.0 standard drinks    Comment: occasional   Drug use: No   Sexual activity: Not Currently  Other Topics Concern   Not on file  Social History Narrative   Not on file   Social Determinants of Health   Financial Resource Strain: Not on file  Food Insecurity: Not on file  Transportation Needs: Not on file  Physical Activity: Not on file  Stress: Not on file  Social Connections: Not on file  Intimate Partner Violence:  Not on file     No Known Allergies   Outpatient Medications Prior to Visit  Medication Sig Dispense Refill   acetaminophen (TYLENOL) 500 MG tablet Take 500 mg by mouth every 6 (six) hours as needed for moderate pain.     amLODipine (NORVASC) 5 MG tablet Take 5 mg by mouth daily.     cetirizine (ZYRTEC) 10 MG tablet Take 10 mg by mouth daily.     citalopram (CELEXA) 20 MG tablet Take 20 mg by mouth daily.     dextromethorphan-guaiFENesin (MUCINEX DM) 30-600 MG 12hr tablet Take 1 tablet by mouth 2 (two) times daily as needed for cough.     diphenhydrAMINE (BENADRYL) 25 MG tablet Take 25 mg by mouth daily as needed for allergies.     fluticasone (FLONASE) 50 MCG/ACT nasal spray Place 1 spray into both nostrils daily.     losartan-hydrochlorothiazide (HYZAAR) 100-25 MG per tablet Take 1 tablet by mouth daily.      montelukast (SINGULAIR) 10 MG tablet Take 10 mg by  mouth at bedtime.     oxyCODONE (OXY IR/ROXICODONE) 5 MG immediate release tablet Take 1 tablet (5 mg total) by mouth every 6 (six) hours as needed for moderate pain. 28 tablet 0   pantoprazole (PROTONIX) 40 MG tablet Take 40 mg by mouth daily.     Propylene Glycol-Glycerin (SOOTHE) 0.6-0.6 % SOLN Place 1 drop into both eyes daily as needed (eye irritation).     simethicone (MYLICON) 858 MG chewable tablet Chew 125 mg by mouth every 6 (six) hours as needed for flatulence.     traMADol (ULTRAM) 50 MG tablet Take 1 tablet (50 mg total) by mouth every 6 (six) hours as needed. 40 tablet 0   traZODone (DESYREL) 50 MG tablet Take 50-100 mg by mouth at bedtime.     No facility-administered medications prior to visit.    Review of Systems  Constitutional:  Negative for chills, fever, malaise/fatigue and weight loss.  HENT:  Negative for hearing loss, sore throat and tinnitus.   Eyes:  Negative for blurred vision and double vision.  Respiratory:  Positive for shortness of breath. Negative for cough, hemoptysis, sputum production, wheezing  and stridor.   Cardiovascular:  Positive for chest pain (sorness post op). Negative for palpitations, orthopnea, leg swelling and PND.  Gastrointestinal:  Negative for abdominal pain, constipation, diarrhea, heartburn, nausea and vomiting.  Genitourinary:  Negative for dysuria, hematuria and urgency.  Musculoskeletal:  Negative for joint pain and myalgias.  Skin:  Negative for itching and rash.  Neurological:  Negative for dizziness, tingling, weakness and headaches.  Endo/Heme/Allergies:  Negative for environmental allergies. Does not bruise/bleed easily.  Psychiatric/Behavioral:  Negative for depression. The patient is not nervous/anxious and does not have insomnia.   All other systems reviewed and are negative.   Objective:  Physical Exam Vitals reviewed.  Constitutional:      General: He is not in acute distress.    Appearance: He is well-developed.  HENT:     Head: Normocephalic and atraumatic.  Eyes:     General: No scleral icterus.    Conjunctiva/sclera: Conjunctivae normal.     Pupils: Pupils are equal, round, and reactive to light.  Neck:     Vascular: No JVD.     Trachea: No tracheal deviation.  Cardiovascular:     Rate and Rhythm: Normal rate and regular rhythm.     Heart sounds: Normal heart sounds. No murmur heard. Pulmonary:     Effort: Pulmonary effort is normal. No tachypnea, accessory muscle usage or respiratory distress.     Breath sounds: No stridor. No wheezing, rhonchi or rales.  Abdominal:     General: There is no distension.     Palpations: Abdomen is soft.     Tenderness: There is no abdominal tenderness.  Musculoskeletal:        General: No tenderness.     Cervical back: Neck supple.  Lymphadenopathy:     Cervical: No cervical adenopathy.  Skin:    General: Skin is warm and dry.     Capillary Refill: Capillary refill takes less than 2 seconds.     Findings: No rash.  Neurological:     Mental Status: He is alert and oriented to person, place, and  time.  Psychiatric:        Behavior: Behavior normal.     Vitals:   02/18/21 1104  BP: 112/70  Pulse: 72  Temp: 97.6 F (36.4 C)  TempSrc: Oral  SpO2: 97%  Weight: 218 lb 6.4 oz (99.1  kg)  Height: 6\' 2"  (1.88 m)    97% on RA BMI Readings from Last 3 Encounters:  02/18/21 28.04 kg/m  02/04/21 27.86 kg/m  01/20/21 27.99 kg/m   Wt Readings from Last 3 Encounters:  02/18/21 218 lb 6.4 oz (99.1 kg)  02/04/21 217 lb (98.4 kg)  01/20/21 218 lb (98.9 kg)     CBC    Component Value Date/Time   WBC 12.1 (H) 01/21/2021 0039   RBC 4.26 01/21/2021 0039   HGB 13.4 01/21/2021 0039   HCT 37.6 (L) 01/21/2021 0039   PLT 286 01/21/2021 0039   MCV 88.3 01/21/2021 0039   MCH 31.5 01/21/2021 0039   MCHC 35.6 01/21/2021 0039   RDW 13.5 01/21/2021 0039    Chest Imaging:  09/02/2020 CT chest: Right upper lobe posterior groundglass lesion concerning for an indolent low-grade neoplasm. The patient's images have been independently reviewed by me.    Pulmonary Functions Testing Results: PFT Results Latest Ref Rng & Units 11/29/2020 06/13/2013  FVC-Pre L 3.83 4.43  FVC-Predicted Pre % 91 102  FVC-Post L 4.34 4.38  FVC-Predicted Post % 103 101  Pre FEV1/FVC % % 71 74  Post FEV1/FCV % % 79 79  FEV1-Pre L 2.73 3.29  FEV1-Predicted Pre % 86 99  FEV1-Post L 3.44 3.48  DLCO uncorrected ml/min/mmHg 22.63 24.67  DLCO UNC% % 82 67  DLCO corrected ml/min/mmHg 23.86 24.67  DLCO COR %Predicted % 86 67  DLVA Predicted % 89 71  TLC L 7.48 7.51  TLC % Predicted % 97 98  RV % Predicted % 110 112    FeNO: no  Pathology:   Surgical Path:  SURGICAL PATHOLOGY  CASE: [id number]  PATIENT: Drake Leach  Surgical Pathology Report   Clinical History: pulmonary nodule (cm)  FINAL MICROSCOPIC DIAGNOSIS:   A. PLEURAL PLAQUE:  - Benign fibrosis, negative for carcinoma   B. LUNG, RIGHT UPPER LOBE, WEDGE RESECTION:  - Lung adenocarcinoma, lepidic predominant (70%) with acinar  component  (30%)  - Visceral pleura is not involved  - Resection margins are negative for carcinoma  - See oncology table   C. LYMPH NODE, 9, EXCISION:  - Lymph node, negative for carcinoma (0/1)   D. LYMPH NODE, 7, EXCISION:  - Lymph node, negative for carcinoma (0/1)   E. LYMPH NODE, HILAR, EXCISION:  - Lymph node, negative for carcinoma (0/1)   F. LUNG, RIGHT UPPER LOBE, LOBECTOMY:  - Focal lung adenocarcinoma, lepidic predominant, 0.4 cm  - Bronchovascular margins are negative for carcinoma  - Five lymph nodes, negative for carcinoma (0/5)   Echocardiogram: no  Heart Catheterization: no    Assessment & Plan:     ICD-10-CM   1. Adenocarcinoma of right lung (HCC)  C34.91    Stage I status post RAB plus RAT    2. Malignant neoplasm of unspecified part of unspecified bronchus or lung (HCC)  C34.90 CT Chest Wo Contrast    Pulmonary Function Test    3. Chronic obstructive pulmonary disease, unspecified COPD type ([place])  J44.9     4. Centrilobular emphysema ([place])  J43.2     5. S/P lobectomy of lung  Z90.2        Discussion:  This is a 76 year old former smoker quit 5 years ago found to have incidental right upper lobe subpleural groundglass lesion taken for bronchoscopy, initial bronc was nondiagnostic ultimately felt the lesion was high risk for malignancy and taken for a robotic assisted bronchoscopy dye mark and robotic  assisted thoracic surgery.  Patient was diagnosed with a stage I adenocarcinoma of the lung and underwent lobectomy.  Plan: Doing well postoperatively at this time. Does have centrilobular emphysema and shortness of breath likely has COPD not on any inhalers. Samples of Trelegy today as well as a new prescription. If he has trouble affording his medications we can always work with our financial aid applications to help get his inhalers. I have ordered a repeat noncontrasted CT scan of the chest for 6 months for his cancer surveillance imaging. Return  to clinic in 6 months with PFTs.    Current Outpatient Medications:    acetaminophen (TYLENOL) 500 MG tablet, Take 500 mg by mouth every 6 (six) hours as needed for moderate pain., Disp: , Rfl:    amLODipine (NORVASC) 5 MG tablet, Take 5 mg by mouth daily., Disp: , Rfl:    cetirizine (ZYRTEC) 10 MG tablet, Take 10 mg by mouth daily., Disp: , Rfl:    citalopram (CELEXA) 20 MG tablet, Take 20 mg by mouth daily., Disp: , Rfl:    dextromethorphan-guaiFENesin (MUCINEX DM) 30-600 MG 12hr tablet, Take 1 tablet by mouth 2 (two) times daily as needed for cough., Disp: , Rfl:    diphenhydrAMINE (BENADRYL) 25 MG tablet, Take 25 mg by mouth daily as needed for allergies., Disp: , Rfl:    fluticasone (FLONASE) 50 MCG/ACT nasal spray, Place 1 spray into both nostrils daily., Disp: , Rfl:    losartan-hydrochlorothiazide (HYZAAR) 100-25 MG per tablet, Take 1 tablet by mouth daily. , Disp: , Rfl:    montelukast (SINGULAIR) 10 MG tablet, Take 10 mg by mouth at bedtime., Disp: , Rfl:    oxyCODONE (OXY IR/ROXICODONE) 5 MG immediate release tablet, Take 1 tablet (5 mg total) by mouth every 6 (six) hours as needed for moderate pain., Disp: 28 tablet, Rfl: 0   pantoprazole (PROTONIX) 40 MG tablet, Take 40 mg by mouth daily., Disp: , Rfl:    Propylene Glycol-Glycerin (SOOTHE) 0.6-0.6 % SOLN, Place 1 drop into both eyes daily as needed (eye irritation)., Disp: , Rfl:    simethicone (MYLICON) 220 MG chewable tablet, Chew 125 mg by mouth every 6 (six) hours as needed for flatulence., Disp: , Rfl:    traMADol (ULTRAM) 50 MG tablet, Take 1 tablet (50 mg total) by mouth every 6 (six) hours as needed., Disp: 40 tablet, Rfl: 0   traZODone (DESYREL) 50 MG tablet, Take 50-100 mg by mouth at bedtime., Disp: , Rfl:     Garner Nash, DO [facility] 02/18/2021 11:19 AM

## 2021-02-18 NOTE — Patient Instructions (Addendum)
Thank you for visiting Dr. Valeta Harms at [facility]. Today we recommend the following:  Orders Placed This Encounter  Procedures   CT Chest Wo Contrast   Pulmonary Function Test   CT imaging follow up in 6 months, June 2023... see [place] afterwards  PFTs prior to next office visit   Samples of Trelegy today   Return in about 6 months (around 08/18/2021) for with APP or Dr. Valeta Harms.    Please do your part to reduce the spread of COVID-19.

## 2021-02-18 NOTE — Addendum Note (Signed)
Addended by: Fran Lowes on: 02/18/2021 11:38 AM   Modules accepted: Orders

## 2021-02-22 ENCOUNTER — Telehealth: Payer: Self-pay

## 2021-02-22 NOTE — Telephone Encounter (Signed)
Patient contacted the office requesting a refill of Oxycodone. He is s/p RATS RUL wedge/ lobectomy with Dr. Kipp Brood 01/20/21. He was discharged from the hospital with Oxycodone but given a refill of Tramadol at his post-op follow up appointment 02/04/21 but called back a couple days later 02/12/20 stating that the Tramadol "doesn't work". He was also advised to take Tylenol regularly which when asked states that "Tylenol does me no good". He states that he is taking two pills daily and he has one left. He states that the pain is on the right side, that "goes across my right side" like a "zap". Advised that we could move up his appointment to this Friday 02/25/21 for further evaluation of pain management as it does sound neuropathic even though patient states the Oxycodone takes care of the pain. He acknowledged receipt and is aware of follow-up appointment, new time.

## 2021-02-24 ENCOUNTER — Other Ambulatory Visit: Payer: Self-pay | Admitting: Thoracic Surgery (Cardiothoracic Vascular Surgery)

## 2021-02-24 DIAGNOSIS — R911 Solitary pulmonary nodule: Secondary | ICD-10-CM

## 2021-02-25 ENCOUNTER — Ambulatory Visit
Admission: RE | Admit: 2021-02-25 | Discharge: 2021-02-25 | Disposition: A | Discharge status: Home / Self Care | Payer: Medicare HMO | Source: Ambulatory Visit | Attending: Thoracic Surgery (Cardiothoracic Vascular Surgery) | Admitting: Thoracic Surgery (Cardiothoracic Vascular Surgery)

## 2021-02-25 ENCOUNTER — Other Ambulatory Visit: Payer: Self-pay

## 2021-02-25 ENCOUNTER — Ambulatory Visit (INDEPENDENT_AMBULATORY_CARE_PROVIDER_SITE_OTHER): Payer: Self-pay | Admitting: Thoracic Surgery (Cardiothoracic Vascular Surgery)

## 2021-02-25 VITALS — BP 129/82 | HR 84 | Resp 20 | Ht 74.0 in | Wt 218.0 lb

## 2021-02-25 DIAGNOSIS — R911 Solitary pulmonary nodule: Secondary | ICD-10-CM

## 2021-02-25 DIAGNOSIS — R918 Other nonspecific abnormal finding of lung field: Secondary | ICD-10-CM | POA: Diagnosis not present

## 2021-02-25 DIAGNOSIS — J939 Pneumothorax, unspecified: Secondary | ICD-10-CM | POA: Diagnosis not present

## 2021-02-25 DIAGNOSIS — Z902 Acquired absence of lung [part of]: Secondary | ICD-10-CM

## 2021-02-25 DIAGNOSIS — J929 Pleural plaque without asbestos: Secondary | ICD-10-CM | POA: Diagnosis not present

## 2021-02-25 MED ORDER — OXYCODONE HCL 5 MG PO TABS: 5.0000 mg | ORAL_TABLET | Freq: Four times a day (QID) | ORAL | 0 refills | Status: DC | PRN

## 2021-02-25 NOTE — Progress Notes (Signed)
° °   °  [address]       [place] [address]             [phone]        Dayvin B Parkerson [place] Medical Record #[id number] Date of Birth: 1945/06/29  Referring: Garner Nash, DO Primary Care: Seward Carol, MD Primary Cardiologist:None  Reason for visit:   follow-up  History of Present Illness:     Mr. Pelzer presents for his 1 month follow-up appointment.  He continues to have some tenderness and swelling along his costal margin.  He denies any shortness of breath  Physical Exam: BP 129/82 (BP Location: Right Arm, Patient Position: Sitting)    Pulse 84    Resp 20    Ht 6\' 2"  (1.88 m)    Wt 218 lb (98.9 kg)    SpO2 95% Comment: RA   BMI 27.99 kg/m   Alert NAD Incision well-healed.   Abdomen soft, ND No peripheral edema   Diagnostic Studies & Laboratory data: CXR: Clear     Assessment / Plan:   76 year old male status post robotic assisted right upper lobectomy for stage I adenocarcinoma of the lung.  Overall he is doing well. 6 months with me for ongoing surveillance.  Lajuana Matte 02/25/2021 12:12 PM

## 2021-03-04 ENCOUNTER — Ambulatory Visit: Payer: Medicare HMO | Admitting: Thoracic Surgery (Cardiothoracic Vascular Surgery)

## 2021-04-26 ENCOUNTER — Telehealth: Payer: Self-pay

## 2021-04-26 NOTE — Telephone Encounter (Signed)
Patient contacted the office with concerns for pain. He is s/p RATS/wedge lobectomy with Dr. Kipp Brood 01/20/21 and has discussed this nerve type pain at his last appointment per patient. He states that Dr. Kipp Brood advised that he could take a cream possibly help with the pain. Patient is calling asking about cream. Advised that he could use over-the-counter Voltaren Gel on the site that is causing him pain. He acknowledged receipt. Also advised to call the office back if this is not successful and we would make another appt to see Dr. Kipp Brood again for further advise. He acknowledged receipt. ?

## 2021-07-19 ENCOUNTER — Ambulatory Visit
Admission: RE | Admit: 2021-07-19 | Discharge: 2021-07-19 | Disposition: A | Discharge status: Home / Self Care | Payer: Medicare HMO | Source: Ambulatory Visit | Attending: Internal Medicine | Admitting: Internal Medicine

## 2021-07-19 DIAGNOSIS — C3490 Malignant neoplasm of unspecified part of unspecified bronchus or lung: Secondary | ICD-10-CM

## 2021-07-29 DIAGNOSIS — Z09 Encounter for follow-up examination after completed treatment for conditions other than malignant neoplasm: Secondary | ICD-10-CM | POA: Diagnosis not present

## 2021-07-29 DIAGNOSIS — D122 Benign neoplasm of ascending colon: Secondary | ICD-10-CM | POA: Diagnosis not present

## 2021-07-29 DIAGNOSIS — D124 Benign neoplasm of descending colon: Secondary | ICD-10-CM | POA: Diagnosis not present

## 2021-07-29 DIAGNOSIS — K5730 Diverticulosis of large intestine without perforation or abscess without bleeding: Secondary | ICD-10-CM | POA: Diagnosis not present

## 2021-07-29 DIAGNOSIS — D120 Benign neoplasm of cecum: Secondary | ICD-10-CM | POA: Diagnosis not present

## 2021-07-29 DIAGNOSIS — Z86010 Personal history of colonic polyps: Secondary | ICD-10-CM | POA: Diagnosis not present

## 2021-07-29 DIAGNOSIS — D125 Benign neoplasm of sigmoid colon: Secondary | ICD-10-CM | POA: Diagnosis not present

## 2021-07-29 DIAGNOSIS — K648 Other hemorrhoids: Secondary | ICD-10-CM | POA: Diagnosis not present

## 2021-07-29 DIAGNOSIS — D123 Benign neoplasm of transverse colon: Secondary | ICD-10-CM | POA: Diagnosis not present

## 2021-08-02 DIAGNOSIS — D125 Benign neoplasm of sigmoid colon: Secondary | ICD-10-CM | POA: Diagnosis not present

## 2021-08-02 DIAGNOSIS — D123 Benign neoplasm of transverse colon: Secondary | ICD-10-CM | POA: Diagnosis not present

## 2021-08-02 DIAGNOSIS — D122 Benign neoplasm of ascending colon: Secondary | ICD-10-CM | POA: Diagnosis not present

## 2021-08-02 DIAGNOSIS — D124 Benign neoplasm of descending colon: Secondary | ICD-10-CM | POA: Diagnosis not present

## 2021-08-02 DIAGNOSIS — D120 Benign neoplasm of cecum: Secondary | ICD-10-CM | POA: Diagnosis not present

## 2021-08-11 ENCOUNTER — Ambulatory Visit: Payer: Medicare HMO | Admitting: Thoracic Surgery (Cardiothoracic Vascular Surgery)

## 2021-08-11 VITALS — BP 119/69 | HR 72 | Resp 18 | Ht 74.0 in | Wt 219.0 lb

## 2021-08-11 DIAGNOSIS — Z85118 Personal history of other malignant neoplasm of bronchus and lung: Secondary | ICD-10-CM

## 2021-08-11 NOTE — Progress Notes (Signed)
      [address]       [place] [address]             [phone]        Devean B Balling [place] Medical Record #[id number] Date of Birth: February 24, 1945  Referring: Garner Nash, DO Primary Care: Seward Carol, MD Primary Cardiologist:None  Reason for visit:   follow-up  History of Present Illness:     Mr. Riolo comes in for 75-month follow-up appointment.  He underwent a robotic assisted right upper lobectomy December 2022.  He denies any respiratory symptoms.  He occasionally has some paresthesias along his right chest wall.  He continues to take some Tylenol intermittently.  Physical Exam: BP 119/69   Pulse 72   Resp 18   Ht 6\' 2"  (1.88 m)   Wt 219 lb (99.3 kg)   SpO2 95%   BMI 28.12 kg/m   Alert NAD Abdomen, ND No peripheral edema   Diagnostic Studies & Laboratory data: CT chest June 2023 IMPRESSION: 1. New 3 mm LEFT lower lobe pulmonary nodule, this is nonspecific. Consider short interval follow-up to assess for any changes. 2. Signs of RIGHT upper lobectomy. No definitive evidence of recurrence or metastasis. 3. Stable RIGHT lower lobe pulmonary nodule, attention on follow-up. 4. Calcified pleural plaques as before. 5. Pulmonary emphysema and aortic atherosclerosis.    Assessment / Plan:   76 year old male with history of stage I adenocarcinoma the right upper lobe.  He has been stable right lower pulmonary nodule measuring 9 mm.  He also has a new left lower lobe 3 mm pulmonary nodule.  He will continue surveillance and follow-up 6 months with CT scan of the chest.    Lajuana Matte 08/11/2021 9:54 AM

## 2021-08-12 ENCOUNTER — Ambulatory Visit: Payer: Medicare HMO | Admitting: Thoracic Surgery (Cardiothoracic Vascular Surgery)

## 2021-08-15 ENCOUNTER — Encounter: Payer: Self-pay | Admitting: Pulmonary Disease

## 2021-08-15 ENCOUNTER — Ambulatory Visit: Payer: Medicare HMO | Admitting: Pulmonary Disease

## 2021-08-15 VITALS — BP 130/76 | HR 62 | Temp 97.9°F | Ht 74.0 in | Wt 221.2 lb

## 2021-08-15 DIAGNOSIS — C3491 Malignant neoplasm of unspecified part of right bronchus or lung: Secondary | ICD-10-CM

## 2021-08-15 DIAGNOSIS — Z902 Acquired absence of lung [part of]: Secondary | ICD-10-CM

## 2021-08-15 DIAGNOSIS — R911 Solitary pulmonary nodule: Secondary | ICD-10-CM | POA: Diagnosis not present

## 2021-08-15 DIAGNOSIS — J449 Chronic obstructive pulmonary disease, unspecified: Secondary | ICD-10-CM

## 2021-08-15 DIAGNOSIS — J432 Centrilobular emphysema: Secondary | ICD-10-CM

## 2021-08-15 DIAGNOSIS — Z87891 Personal history of nicotine dependence: Secondary | ICD-10-CM | POA: Diagnosis not present

## 2021-08-15 MED ORDER — TRELEGY ELLIPTA 100-62.5-25 MCG/ACT IN AEPB: 1.0000 | INHALATION_SPRAY | Freq: Every day | RESPIRATORY_TRACT | 0 refills | Status: AC

## 2021-08-15 NOTE — Patient Instructions (Addendum)
Thank you for visiting Dr. Valeta Harms at [facility]. Today we recommend the following:  Orders Placed This Encounter  Procedures   CT Super D Chest Wo Contrast   Repeat ct in march 2024 Samples and new prescription for trelegy   Return in about 9 months (around 05/17/2022) for with Eric Form, NP, or Dr. Valeta Harms.    Please do your part to reduce the spread of COVID-19.

## 2021-08-15 NOTE — Progress Notes (Signed)
Synopsis: Referred in August 2022 for lung nodule by Seward Carol, MD  Subjective:   PATIENT ID: Lee Leblanc GENDER: male DOB: 26-Sep-1945, MRN: [id number]  Chief Complaint  Patient presents with   Follow-up    Sob at times, CT review      This is a 76 year old gentleman, COPD, GERD, hypertension.  Referred for abnormal CT imaging.  Seen in the cardiothoracic surgery office for aneurysm surveillance.  He is a 76 year old with a 50-pack-year history of smoking.  Previously followed by Dr. Lucianne Lei trite and seen again in follow-up by Dr. 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  Incidentally was found to have a right lung nodule.  Patient CT imaging was completed on 09/02/2020.  This revealed a progressive nonsolid nodule subsolid in nature for the posterior right upper lobe areas of groundglass suspicious for a low-grade indolent neoplasm.  OV 09/29/2020: Here today to review the CT imaging.  We discussed this today in the office.  Patient has no significant complaints at this time.  OV 11/26/2020: Here today for follow-up after bronchoscopy.  All tissue samples from the upper lobe lesion following bronchoscopy were all negative for malignancy.  We reviewed this today in the office.  Presents with today discussion was patient's wife.  Reviewed images.  I explained that I still felt like this lesion within the upper lobe is likely represent a malignancy.  We discussed various options to include repeat biopsy or even consideration for resection.  OV 02/18/2021: Here today for follow-up after recent robotic assisted bronchoscopy, fiducial dye marking followed by robotic assisted thoracic surgery and lobectomy by Dr. Kipp Brood.  From respiratory standpoint doing okay still has some shortness of breath.  Does have evidence of centrilobular emphysema on previous imaging.  OV 08/15/2021: Patient here today for follow-up CT imaging.Patient has also had recent follow-up with cardiothoracic surgery on  08/11/2021.  Office note reviewed from Dr. Kipp Brood.  CT scan of the chest was completed on 07/19/2021 which shows stable emphysema.  Also has a new 3 mm left lower lobe pulmonary nodule.  No evidence of recurrence of disease in the right lung status post right upper lobectomy stable right lower lobe pulmonary follow-up needed for nodule.  Overall from respiratory standpoint he is doing okay.  He still does have shortness of breath with exertion.  And a mild cough.  Been dealing with some chest wall pain status post surgery.  We reviewed his CT imaging today in the office with him and his wife.  He does feel like he breathes better when he was using Trelegy that was given to him as a sample.  He would like to have a prescription for this.    Past Medical History:  Diagnosis Date   COPD (chronic obstructive pulmonary disease) (HCC)    has not used albuterol inhaler for last 2 months   Depression    Dyspnea    GERD (gastroesophageal reflux disease)    Hypertension      Family History  Problem Relation Age of Onset   Allergies Daughter    Allergies Son    Leukemia Sister    Prostate cancer Father      Past Surgical History:  Procedure Laterality Date   BACK SURGERY    lasdt 1990's   lower x 7   BRONCHIAL BIOPSY  10/26/2020   Procedure: BRONCHIAL BIOPSIES;  Surgeon: Garner Nash, DO;  Location: [facility];  Service: Pulmonary;;   BRONCHIAL BIOPSY  01/20/2021  Procedure: BRONCHIAL BIOPSIES;  Surgeon: Garner Nash, DO;  Location: [facility];  Service: Pulmonary;;   BRONCHIAL BRUSHINGS  10/26/2020   Procedure: BRONCHIAL BRUSHINGS;  Surgeon: Garner Nash, DO;  Location: [facility];  Service: Pulmonary;;   BRONCHIAL BRUSHINGS  01/20/2021   Procedure: BRONCHIAL BRUSHINGS;  Surgeon: Garner Nash, DO;  Location: [facility];  Service: Pulmonary;;   BRONCHIAL NEEDLE ASPIRATION BIOPSY  10/26/2020   Procedure: BRONCHIAL NEEDLE ASPIRATION BIOPSIES;  Surgeon: Garner Nash,  DO;  Location: [facility];  Service: Pulmonary;;   BRONCHIAL WASHINGS  10/26/2020   Procedure: BRONCHIAL WASHINGS;  Surgeon: Garner Nash, DO;  Location: [facility];  Service: Pulmonary;;   COLONOSCOPY WITH PROPOFOL N/A 06/25/2012   Procedure: COLONOSCOPY WITH PROPOFOL;  Surgeon: Garlan Fair, MD;  Location: [facility];  Service: Endoscopy;  Laterality: N/A;   ESOPHAGOGASTRODUODENOSCOPY (EGD) WITH PROPOFOL N/A 06/25/2012   Procedure: ESOPHAGOGASTRODUODENOSCOPY (EGD) WITH PROPOFOL;  Surgeon: Garlan Fair, MD;  Location: [facility];  Service: Endoscopy;  Laterality: N/A;   EYE SURGERY Right    FIDUCIAL MARKER PLACEMENT  10/26/2020   Procedure: FIDUCIAL MARKER PLACEMENT;  Surgeon: Garner Nash, DO;  Location: [facility];  Service: Pulmonary;;   FIDUCIAL MARKER PLACEMENT  01/20/2021   Procedure: FIDUCIAL DYE MARKING;  Surgeon: Garner Nash, DO;  Location: [facility];  Service: Pulmonary;;   INTERCOSTAL NERVE BLOCK Right 01/20/2021   Procedure: INTERCOSTAL NERVE BLOCK;  Surgeon: Lajuana Matte, MD;  Location: [facility];  Service: Thoracic;  Laterality: Right;   LYMPH NODE DISSECTION Right 01/20/2021   Procedure: LYMPH NODE DISSECTION;  Surgeon: Lajuana Matte, MD;  Location: [facility];  Service: Thoracic;  Laterality: Right;   SHOULDER ARTHROSCOPY Right    VIDEO BRONCHOSCOPY WITH ENDOBRONCHIAL NAVIGATION N/A 10/26/2020   Procedure: VIDEO BRONCHOSCOPY WITH ENDOBRONCHIAL NAVIGATION;  Surgeon: Garner Nash, DO;  Location: [facility];  Service: Pulmonary;  Laterality: N/A;  ION w/ fiducial placement   VIDEO BRONCHOSCOPY WITH RADIAL ENDOBRONCHIAL ULTRASOUND  10/26/2020   Procedure: VIDEO BRONCHOSCOPY WITH RADIAL ENDOBRONCHIAL ULTRASOUND;  Surgeon: Garner Nash, DO;  Location: [facility];  Service: Pulmonary;;    Social History   Socioeconomic History   Marital status: Married    Spouse name: Beckie   Number of children: 6   Years of education: Not on file    Highest education level: Not on file  Occupational History   Occupation: Retired  Tobacco Use   Smoking status: Former    Packs/day: 0.50    Years: 50.00    Total pack years: 25.00    Types: Cigarettes    Start date: 02/06/1961    Quit date: 02/07/2011    Years since quitting: 10.5   Smokeless tobacco: Never  Vaping Use   Vaping Use: Never used  Substance and Sexual Activity   Alcohol use: Yes    Alcohol/week: 0.0 standard drinks of alcohol    Comment: occasional   Drug use: No   Sexual activity: Not Currently  Other Topics Concern   Not on file  Social History Narrative   Not on file   Social Determinants of Health   Financial Resource Strain: Not on file  Food Insecurity: Not on file  Transportation Needs: Not on file  Physical Activity: Not on file  Stress: Not on file  Social Connections: Not on file  Intimate Partner Violence: Not on file     No Known Allergies   Outpatient Medications Prior to Visit  Medication Sig Dispense Refill   acetaminophen (TYLENOL) 500 MG tablet Take 500 mg by mouth every 6 (six) hours as needed for moderate pain.     amLODipine (NORVASC) 5 MG tablet Take 5 mg by mouth daily.     cetirizine (ZYRTEC) 10 MG tablet Take 10 mg by mouth daily.     citalopram (CELEXA) 20 MG tablet Take 20 mg by mouth daily.     dextromethorphan-guaiFENesin (MUCINEX DM) 30-600 MG 12hr tablet Take 1 tablet by mouth 2 (two) times daily as needed for cough.     diphenhydrAMINE (BENADRYL) 25 MG tablet Take 25 mg by mouth daily as needed for allergies.     fluticasone (FLONASE) 50 MCG/ACT nasal spray Place 1 spray into both nostrils daily.     losartan-hydrochlorothiazide (HYZAAR) 100-25 MG per tablet Take 1 tablet by mouth daily.      montelukast (SINGULAIR) 10 MG tablet Take 10 mg by mouth at bedtime.     pantoprazole (PROTONIX) 40 MG tablet Take 40 mg by mouth daily.     Propylene Glycol-Glycerin (SOOTHE) 0.6-0.6 % SOLN Place 1 drop into both eyes daily as  needed (eye irritation).     traZODone (DESYREL) 50 MG tablet Take 50-100 mg by mouth at bedtime.     Fluticasone-Umeclidin-Vilant (TRELEGY ELLIPTA) 100-62.5-25 MCG/ACT AEPB Inhale 1 puff into the lungs daily. (Patient not taking: Reported on 08/15/2021) 60 each 0   oxyCODONE (OXY IR/ROXICODONE) 5 MG immediate release tablet Take 1 tablet (5 mg total) by mouth every 6 (six) hours as needed for moderate pain. (Patient not taking: Reported on 08/11/2021) 28 tablet 0   simethicone (MYLICON) 062 MG chewable tablet Chew 125 mg by mouth every 6 (six) hours as needed for flatulence. (Patient not taking: Reported on 08/11/2021)     traMADol (ULTRAM) 50 MG tablet Take 1 tablet (50 mg total) by mouth every 6 (six) hours as needed. (Patient not taking: Reported on 08/11/2021) 40 tablet 0   No facility-administered medications prior to visit.    Review of Systems  Constitutional:  Negative for chills, fever, malaise/fatigue and weight loss.  HENT:  Negative for hearing loss, sore throat and tinnitus.   Eyes:  Negative for blurred vision and double vision.  Respiratory:  Positive for cough and shortness of breath. Negative for hemoptysis, sputum production, wheezing and stridor.   Cardiovascular:  Negative for chest pain, palpitations, orthopnea, leg swelling and PND.  Gastrointestinal:  Negative for abdominal pain, constipation, diarrhea, heartburn, nausea and vomiting.  Genitourinary:  Negative for dysuria, hematuria and urgency.  Musculoskeletal:  Negative for joint pain and myalgias.  Skin:  Negative for itching and rash.  Neurological:  Negative for dizziness, tingling, weakness and headaches.  Endo/Heme/Allergies:  Negative for environmental allergies. Does not bruise/bleed easily.  Psychiatric/Behavioral:  Negative for depression. The patient is not nervous/anxious and does not have insomnia.   All other systems reviewed and are negative.    Objective:  Physical Exam Vitals reviewed.   Constitutional:      General: He is not in acute distress.    Appearance: He is well-developed.  HENT:     Head: Normocephalic and atraumatic.  Eyes:     General: No scleral icterus.    Conjunctiva/sclera: Conjunctivae normal.     Pupils: Pupils are equal, round, and reactive to light.  Neck:     Vascular: No JVD.     Trachea: No tracheal deviation.  Cardiovascular:     Rate and Rhythm: Normal rate and  regular rhythm.     Heart sounds: Normal heart sounds. No murmur heard. Pulmonary:     Effort: Pulmonary effort is normal. No tachypnea, accessory muscle usage or respiratory distress.     Breath sounds: No stridor. No wheezing, rhonchi or rales.  Abdominal:     General: There is no distension.     Palpations: Abdomen is soft.     Tenderness: There is no abdominal tenderness.  Musculoskeletal:        General: No tenderness.     Cervical back: Neck supple.  Lymphadenopathy:     Cervical: No cervical adenopathy.  Skin:    General: Skin is warm and dry.     Capillary Refill: Capillary refill takes less than 2 seconds.     Findings: No rash.  Neurological:     Mental Status: He is alert and oriented to person, place, and time.  Psychiatric:        Behavior: Behavior normal.      Vitals:   08/15/21 1042  BP: 130/76  Pulse: 62  Temp: 97.9 F (36.6 C)  TempSrc: Oral  SpO2: 98%  Weight: 221 lb 3.2 oz (100.3 kg)  Height: 6\' 2"  (1.88 m)    98% on RA BMI Readings from Last 3 Encounters:  08/15/21 28.40 kg/m  08/11/21 28.12 kg/m  02/25/21 27.99 kg/m   Wt Readings from Last 3 Encounters:  08/15/21 221 lb 3.2 oz (100.3 kg)  08/11/21 219 lb (99.3 kg)  02/25/21 218 lb (98.9 kg)     CBC    Component Value Date/Time   WBC 12.1 (H) 01/21/2021 0039   RBC 4.26 01/21/2021 0039   HGB 13.4 01/21/2021 0039   HCT 37.6 (L) 01/21/2021 0039   PLT 286 01/21/2021 0039   MCV 88.3 01/21/2021 0039   MCH 31.5 01/21/2021 0039   MCHC 35.6 01/21/2021 0039   RDW 13.5 01/21/2021  0039    Chest Imaging:  09/02/2020 CT chest: Right upper lobe posterior groundglass lesion concerning for an indolent low-grade neoplasm. The patient's images have been independently reviewed by me.    CT chest July 2023: Follow-up imaging reveals a right upper lobe lobectomy changes no evidence of recurrent disease from the right lung.  Does have a new left lower lobe small 3 mm nodule that was not there previously. The patient's images have been independently reviewed by me.    Pulmonary Functions Testing Results:    Latest Ref Rng & Units 11/29/2020    9:55 AM 06/13/2013   10:53 AM  PFT Results  FVC-Pre L 3.83  4.43   FVC-Predicted Pre % 91  102   FVC-Post L 4.34  4.38   FVC-Predicted Post % 103  101   Pre FEV1/FVC % % 71  74   Post FEV1/FCV % % 79  79   FEV1-Pre L 2.73  3.29   FEV1-Predicted Pre % 86  99   FEV1-Post L 3.44  3.48   DLCO uncorrected ml/min/mmHg 22.63  24.67   DLCO UNC% % 82  67   DLCO corrected ml/min/mmHg 23.86  24.67   DLCO COR %Predicted % 86  67   DLVA Predicted % 89  71   TLC L 7.48  7.51   TLC % Predicted % 97  98   RV % Predicted % 110  112     FeNO: no  Pathology:   Surgical Path:  SURGICAL PATHOLOGY  CASE: [id number]  PATIENT: Drake Leach  Surgical Pathology Report  Clinical History: pulmonary nodule (cm)  FINAL MICROSCOPIC DIAGNOSIS:   A. PLEURAL PLAQUE:  - Benign fibrosis, negative for carcinoma   B. 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 LYMPH NODE, 9, EXCISION:  - Lymph node, negative for carcinoma (0/1)   D. LYMPH NODE, 7, EXCISION:  - Lymph node, negative for carcinoma (0/1)   E. LYMPH NODE, HILAR, EXCISION:  - Lymph node, negative for carcinoma (0/1)   F. LUNG, RIGHT UPPER LOBE, LOBECTOMY:  - Focal lung adenocarcinoma,  lepidic predominant, 0.4 cm  - Bronchovascular margins are negative for carcinoma  - Five lymph nodes, negative for carcinoma (0/5)   Echocardiogram: no  Heart Catheterization: no    Assessment & Plan:     ICD-10-CM   1. Lung nodule  R91.1 CT Super D Chest Wo Contrast    2. Adenocarcinoma of right lung ([place])  C34.91     3. S/P lobectomy of lung  Z90.2     4. Centrilobular emphysema ([place])  J43.2     5. Chronic obstructive pulmonary disease, unspecified COPD type ([place])  J44.9     6. Former smoker  Z87.891     81. Left lower lobe pulmonary nodule  R91.1        Discussion:  This is a 76 year old former smoker quit 6 years ago found to have incidental right upper lobe subpleural groundglass nodule initially taken for bronchoscopy that was nondiagnostic however was felt high risk for malignancy and was taken for a robotic assisted navigational bronchoscopy dye marking and robotic assisted thoracic surgery by myself and Dr. Kipp Brood for single anesthetic event diagnosed with a stage I adenocarcinoma of the lung.  Lymph nodes were negative.  He does have centrilobular emphysema and likely has mild COPD symptoms now status post resection he has ongoing respiratory symptoms to include cough and shortness of breath with exertion.  He did well and improved with inhalers but does not use them regularly now  Plan: Trelegy samples New prescription for Trelegy inhaler Albuterol as needed Repeat CT scan of the chest for follow-up of left lower lobe pulmonary nodule in 9 months, CT chest in March 2024 with an appointment to see me or Eric Form, NP following CT imaging.    Current Outpatient Medications:    acetaminophen (TYLENOL) 500 MG tablet, Take 500 mg by mouth every 6 (six) hours as needed for moderate pain., Disp: , Rfl:    amLODipine (NORVASC) 5 MG tablet, Take 5 mg by mouth daily., Disp: , Rfl:    cetirizine (ZYRTEC) 10 MG tablet, Take 10 mg by mouth daily., Disp: , Rfl:     citalopram (CELEXA) 20 MG tablet, Take 20 mg by mouth daily., Disp: , Rfl:    dextromethorphan-guaiFENesin (MUCINEX DM) 30-600 MG 12hr tablet, Take 1 tablet by mouth 2 (two) times daily as needed for cough., Disp: , Rfl:    diphenhydrAMINE (BENADRYL) 25 MG tablet, Take 25 mg by mouth daily as needed for allergies., Disp: , Rfl:    fluticasone (FLONASE) 50 MCG/ACT nasal spray, Place 1 spray into both nostrils daily., Disp: , Rfl:    losartan-hydrochlorothiazide (HYZAAR) 100-25 MG per tablet, Take 1 tablet by mouth daily. , Disp: , Rfl:    montelukast (SINGULAIR) 10 MG tablet, Take 10 mg by mouth at bedtime., Disp: ,  Rfl:    pantoprazole (PROTONIX) 40 MG tablet, Take 40 mg by mouth daily., Disp: , Rfl:    Propylene Glycol-Glycerin (SOOTHE) 0.6-0.6 % SOLN, Place 1 drop into both eyes daily as needed (eye irritation)., Disp: , Rfl:    traZODone (DESYREL) 50 MG tablet, Take 50-100 mg by mouth at bedtime., Disp: , Rfl:    Fluticasone-Umeclidin-Vilant (TRELEGY ELLIPTA) 100-62.5-25 MCG/ACT AEPB, Inhale 1 puff into the lungs daily. (Patient not taking: Reported on 08/15/2021), Disp: 60 each, Rfl: 0   oxyCODONE (OXY IR/ROXICODONE) 5 MG immediate release tablet, Take 1 tablet (5 mg total) by mouth every 6 (six) hours as needed for moderate pain. (Patient not taking: Reported on 08/11/2021), Disp: 28 tablet, Rfl: 0   simethicone (MYLICON) 943 MG chewable tablet, Chew 125 mg by mouth every 6 (six) hours as needed for flatulence. (Patient not taking: Reported on 08/11/2021), Disp: , Rfl:    traMADol (ULTRAM) 50 MG tablet, Take 1 tablet (50 mg total) by mouth every 6 (six) hours as needed. (Patient not taking: Reported on 08/11/2021), Disp: 40 tablet, Rfl: 0    Garner Nash, DO [facility] 08/15/2021 11:05 AM

## 2021-08-15 NOTE — Addendum Note (Signed)
Addended by: Gavin Potters R on: 08/15/2021 11:58 AM   Modules accepted: Orders

## 2021-09-02 ENCOUNTER — Ambulatory Visit: Payer: Medicare HMO | Admitting: Thoracic Surgery (Cardiothoracic Vascular Surgery)

## 2021-09-07 ENCOUNTER — Other Ambulatory Visit: Payer: Self-pay | Admitting: *Deleted

## 2021-09-07 NOTE — Patient Outreach (Signed)
  Care Coordination   09/07/2021 Name: Lee Leblanc MRN: [id number] DOB: 06/06/1945   Care Coordination Outreach Attempts:  An unsuccessful telephone outreach was attempted today to offer the patient information about available care coordination services as a benefit of their health plan.   Follow Up Plan:  Additional outreach attempts will be made to offer the patient care coordination information and services.   Encounter Outcome:  No Answer  Care Coordination Interventions Activated:  Y   Care Coordination Interventions:  No, not indicated    [facility] [phone]

## 2021-09-19 DIAGNOSIS — J029 Acute pharyngitis, unspecified: Secondary | ICD-10-CM | POA: Diagnosis not present

## 2021-10-05 ENCOUNTER — Other Ambulatory Visit: Payer: Self-pay | Admitting: Gastroenterology

## 2021-11-16 DIAGNOSIS — H5203 Hypermetropia, bilateral: Secondary | ICD-10-CM | POA: Diagnosis not present

## 2021-11-16 DIAGNOSIS — Z0100 Encounter for examination of eyes and vision without abnormal findings: Secondary | ICD-10-CM | POA: Diagnosis not present

## 2021-11-16 DIAGNOSIS — H524 Presbyopia: Secondary | ICD-10-CM | POA: Diagnosis not present

## 2021-11-16 DIAGNOSIS — H2512 Age-related nuclear cataract, left eye: Secondary | ICD-10-CM | POA: Diagnosis not present

## 2021-11-16 DIAGNOSIS — Z961 Presence of intraocular lens: Secondary | ICD-10-CM | POA: Diagnosis not present

## 2021-11-16 DIAGNOSIS — H43811 Vitreous degeneration, right eye: Secondary | ICD-10-CM | POA: Diagnosis not present

## 2021-11-16 DIAGNOSIS — H35033 Hypertensive retinopathy, bilateral: Secondary | ICD-10-CM | POA: Diagnosis not present

## 2021-11-16 DIAGNOSIS — H52223 Regular astigmatism, bilateral: Secondary | ICD-10-CM | POA: Diagnosis not present

## 2021-11-21 DIAGNOSIS — G4700 Insomnia, unspecified: Secondary | ICD-10-CM | POA: Diagnosis not present

## 2021-11-21 DIAGNOSIS — I7121 Aneurysm of the ascending aorta, without rupture: Secondary | ICD-10-CM | POA: Diagnosis not present

## 2021-11-21 DIAGNOSIS — I10 Essential (primary) hypertension: Secondary | ICD-10-CM | POA: Diagnosis not present

## 2021-11-21 DIAGNOSIS — F324 Major depressive disorder, single episode, in partial remission: Secondary | ICD-10-CM | POA: Diagnosis not present

## 2021-11-21 DIAGNOSIS — Z0000 Encounter for general adult medical examination without abnormal findings: Secondary | ICD-10-CM | POA: Diagnosis not present

## 2021-11-21 DIAGNOSIS — C3491 Malignant neoplasm of unspecified part of right bronchus or lung: Secondary | ICD-10-CM | POA: Diagnosis not present

## 2021-11-21 DIAGNOSIS — J439 Emphysema, unspecified: Secondary | ICD-10-CM | POA: Diagnosis not present

## 2021-12-02 ENCOUNTER — Telehealth: Payer: Self-pay

## 2021-12-02 NOTE — Patient Outreach (Signed)
  Care Coordination   12/02/2021 Name: Lee Leblanc MRN: [id number] DOB: 05/09/1945   Care Coordination Outreach Attempts:  A second unsuccessful outreach was attempted today to offer the patient with information about available care coordination services as a benefit of their health plan.     Follow Up Plan:  Additional outreach attempts will be made to offer the patient care coordination information and services.   Encounter Outcome:  No Answer  Care Coordination Interventions Activated:  No   Care Coordination Interventions:  No, not indicated    Peter Garter RN, BSN,CCM, [facility] [phone]

## 2021-12-23 ENCOUNTER — Encounter (HOSPITAL_COMMUNITY): Payer: Self-pay | Admitting: Gastroenterology

## 2021-12-26 ENCOUNTER — Encounter (HOSPITAL_COMMUNITY): Payer: Self-pay | Admitting: Gastroenterology

## 2021-12-26 NOTE — Progress Notes (Signed)
Attempted to obtain medical history via telephone, unable to reach at this time. HIPAA compliant voicemail message left requesting return call to pre surgical testing department. 

## 2022-01-02 NOTE — Anesthesia Preprocedure Evaluation (Signed)
Anesthesia Evaluation  Patient identified by MRN, date of birth, ID band Patient awake    Reviewed: Allergy & Precautions, NPO status , Patient's Chart, lab work & pertinent test results  History of Anesthesia Complications Negative for: history of anesthetic complications  Airway Mallampati: III  TM Distance: >3 FB Neck ROM: Full    Dental  (+) Partial Upper, Partial Lower   Pulmonary neg shortness of breath, neg sleep apnea, COPD (last inhaler use 2 weeks ago), neg recent URI, former smoker S/p lobectomy   Pulmonary exam normal breath sounds clear to auscultation       Cardiovascular hypertension, Pt. on medications (-) angina (-) Past MI, (-) Cardiac Stents and (-) CABG  Rhythm:Regular Rate:Normal  4.5 cm Thoracic aortic aneurysm  Stress test 12/13/2020: normal, low-risk   Neuro/Psych neg Seizures PSYCHIATRIC DISORDERS  Depression     Neuromuscular disease (spondylolisthesis of lumbar region)    GI/Hepatic Neg liver ROS,GERD  ,,  Endo/Other  negative endocrine ROS    Renal/GU Renal InsufficiencyRenal disease     Musculoskeletal   Abdominal  (+) - obese  Peds  Hematology negative hematology ROS (+)   Anesthesia Other Findings   Reproductive/Obstetrics                             Anesthesia Physical Anesthesia Plan  ASA: 3  Anesthesia Plan: MAC   Post-op Pain Management:    Induction: Intravenous  PONV Risk Score and Plan: 1 and Propofol infusion and TIVA  Airway Management Planned: Natural Airway and Nasal Cannula  Additional Equipment:   Intra-op Plan:   Post-operative Plan:   Informed Consent: I have reviewed the patients History and Physical, chart, labs and discussed the procedure including the risks, benefits and alternatives for the proposed anesthesia with the patient or authorized representative who has indicated his/her understanding and acceptance.     Dental  advisory given  Plan Discussed with: CRNA and Anesthesiologist  Anesthesia Plan Comments: (Discussed with patient risks of MAC including, but not limited to, minor pain or discomfort, hearing people in the room, and possible need for backup general anesthesia. Risks for general anesthesia also discussed including, but not limited to, sore throat, hoarse voice, chipped/damaged teeth, injury to vocal cords, nausea and vomiting, allergic reactions, lung infection, heart attack, stroke, and death. All questions answered. )       Anesthesia Quick Evaluation

## 2022-01-03 ENCOUNTER — Encounter (HOSPITAL_COMMUNITY): Payer: Self-pay | Admitting: Gastroenterology

## 2022-01-03 ENCOUNTER — Encounter (HOSPITAL_COMMUNITY)
Admission: RE | Disposition: A | Discharge status: Home / Self Care | Payer: Self-pay | Source: Home / Self Care | Attending: Gastroenterology

## 2022-01-03 ENCOUNTER — Ambulatory Visit (HOSPITAL_COMMUNITY): Payer: Medicare HMO | Admitting: Anesthesiology

## 2022-01-03 ENCOUNTER — Other Ambulatory Visit: Payer: Self-pay

## 2022-01-03 ENCOUNTER — Ambulatory Visit (HOSPITAL_COMMUNITY)
Admission: RE | Admit: 2022-01-03 | Discharge: 2022-01-03 | Disposition: A | Discharge status: Home / Self Care | Payer: Medicare HMO | Attending: Gastroenterology | Admitting: Gastroenterology

## 2022-01-03 ENCOUNTER — Ambulatory Visit (HOSPITAL_BASED_OUTPATIENT_CLINIC_OR_DEPARTMENT_OTHER): Payer: Medicare HMO | Admitting: Anesthesiology

## 2022-01-03 DIAGNOSIS — D125 Benign neoplasm of sigmoid colon: Secondary | ICD-10-CM | POA: Insufficient documentation

## 2022-01-03 DIAGNOSIS — I712 Thoracic aortic aneurysm, without rupture, unspecified: Secondary | ICD-10-CM | POA: Insufficient documentation

## 2022-01-03 DIAGNOSIS — Z1211 Encounter for screening for malignant neoplasm of colon: Secondary | ICD-10-CM | POA: Diagnosis not present

## 2022-01-03 DIAGNOSIS — I10 Essential (primary) hypertension: Secondary | ICD-10-CM

## 2022-01-03 DIAGNOSIS — D122 Benign neoplasm of ascending colon: Secondary | ICD-10-CM | POA: Diagnosis not present

## 2022-01-03 DIAGNOSIS — M4316 Spondylolisthesis, lumbar region: Secondary | ICD-10-CM | POA: Diagnosis not present

## 2022-01-03 DIAGNOSIS — Z86010 Personal history of colonic polyps: Secondary | ICD-10-CM

## 2022-01-03 DIAGNOSIS — Z09 Encounter for follow-up examination after completed treatment for conditions other than malignant neoplasm: Secondary | ICD-10-CM | POA: Diagnosis not present

## 2022-01-03 DIAGNOSIS — Z860101 Personal history of adenomatous and serrated colon polyps: Secondary | ICD-10-CM

## 2022-01-03 DIAGNOSIS — Z87891 Personal history of nicotine dependence: Secondary | ICD-10-CM | POA: Diagnosis not present

## 2022-01-03 DIAGNOSIS — D123 Benign neoplasm of transverse colon: Secondary | ICD-10-CM

## 2022-01-03 DIAGNOSIS — D128 Benign neoplasm of rectum: Secondary | ICD-10-CM

## 2022-01-03 DIAGNOSIS — K219 Gastro-esophageal reflux disease without esophagitis: Secondary | ICD-10-CM | POA: Diagnosis not present

## 2022-01-03 DIAGNOSIS — K621 Rectal polyp: Secondary | ICD-10-CM | POA: Diagnosis not present

## 2022-01-03 DIAGNOSIS — K635 Polyp of colon: Secondary | ICD-10-CM | POA: Diagnosis not present

## 2022-01-03 DIAGNOSIS — K640 First degree hemorrhoids: Secondary | ICD-10-CM | POA: Diagnosis not present

## 2022-01-03 HISTORY — PX: COLONOSCOPY WITH PROPOFOL: SHX5780

## 2022-01-03 HISTORY — PX: POLYPECTOMY: SHX5525

## 2022-01-03 HISTORY — PX: BIOPSY: SHX5522

## 2022-01-03 SURGERY — COLONOSCOPY WITH PROPOFOL
Anesthesia: Monitor Anesthesia Care

## 2022-01-03 MED ORDER — PROPOFOL 500 MG/50ML IV EMUL
INTRAVENOUS | Status: DC | PRN
  Administered 2022-01-03: 125 ug/kg/min via INTRAVENOUS

## 2022-01-03 MED ORDER — PROPOFOL 1000 MG/100ML IV EMUL
INTRAVENOUS | Status: AC
  Filled 2022-01-03: qty 100

## 2022-01-03 MED ORDER — LACTATED RINGERS IV SOLN: INTRAVENOUS | Status: DC

## 2022-01-03 MED ORDER — PROPOFOL 500 MG/50ML IV EMUL
INTRAVENOUS | Status: AC
  Filled 2022-01-03: qty 50

## 2022-01-03 MED ORDER — SODIUM CHLORIDE 0.9 % IV SOLN: INTRAVENOUS | Status: DC

## 2022-01-03 MED ORDER — PHENYLEPHRINE 80 MCG/ML (10ML) SYRINGE FOR IV PUSH (FOR BLOOD PRESSURE SUPPORT)
PREFILLED_SYRINGE | INTRAVENOUS | Status: DC | PRN
  Administered 2022-01-03 (×2): 80 ug via INTRAVENOUS
  Administered 2022-01-03: 160 ug via INTRAVENOUS

## 2022-01-03 MED ORDER — PROPOFOL 10 MG/ML IV BOLUS
INTRAVENOUS | Status: DC | PRN
  Administered 2022-01-03: 30 mg via INTRAVENOUS

## 2022-01-03 MED ORDER — GLYCOPYRROLATE PF 0.2 MG/ML IJ SOSY
PREFILLED_SYRINGE | INTRAMUSCULAR | Status: DC | PRN
  Administered 2022-01-03: .2 mg via INTRAVENOUS

## 2022-01-03 SURGICAL SUPPLY — 22 items
ELECT REM PT RETURN 9FT ADLT (ELECTROSURGICAL)
ELECTRODE REM PT RTRN 9FT ADLT (ELECTROSURGICAL) IMPLANT
FCP BXJMBJMB 240X2.8X (CUTTING FORCEPS)
FLOOR PAD 36X40 (MISCELLANEOUS) ×3
FORCEPS BIOP RAD 4 LRG CAP 4 (CUTTING FORCEPS) IMPLANT
FORCEPS BIOP RJ4 240 W/NDL (CUTTING FORCEPS)
FORCEPS BXJMBJMB 240X2.8X (CUTTING FORCEPS) IMPLANT
INJECTOR/SNARE I SNARE (MISCELLANEOUS) IMPLANT
LUBRICANT JELLY 4.5OZ STERILE (MISCELLANEOUS) IMPLANT
MANIFOLD NEPTUNE II (INSTRUMENTS) IMPLANT
NDL SCLEROTHERAPY 25GX240 (NEEDLE) IMPLANT
NEEDLE SCLEROTHERAPY 25GX240 (NEEDLE) IMPLANT
PAD FLOOR 36X40 (MISCELLANEOUS) ×3 IMPLANT
PROBE APC STR FIRE (PROBE) IMPLANT
PROBE INJECTION GOLD (MISCELLANEOUS)
PROBE INJECTION GOLD 7FR (MISCELLANEOUS) IMPLANT
SNARE ROTATE MED OVAL 20MM (MISCELLANEOUS) IMPLANT
SYR 50ML LL SCALE MARK (SYRINGE) IMPLANT
TRAP SPECIMEN MUCOUS 40CC (MISCELLANEOUS) IMPLANT
TUBING ENDO SMARTCAP PENTAX (MISCELLANEOUS) IMPLANT
TUBING IRRIGATION ENDOGATOR (MISCELLANEOUS) ×3 IMPLANT
WATER STERILE IRR 1000ML POUR (IV SOLUTION) IMPLANT

## 2022-01-03 NOTE — Anesthesia Procedure Notes (Signed)
Procedure Name: MAC Date/Time: 01/03/2022 7:49 AM  Performed by: Jenne Campus, CRNAPre-anesthesia Checklist: Patient identified, Emergency Drugs available, Suction available and Patient being monitored Oxygen Delivery Method: Simple face mask Placement Confirmation: CO2 detector

## 2022-01-03 NOTE — H&P (View-Only) (Signed)
Date of Initial H&P: 12/20/21  History reviewed, patient examined, no change in status, stable for surgery.

## 2022-01-03 NOTE — Interval H&P Note (Signed)
History and Physical Interval Note:  01/03/2022 7:46 AM  Lee Leblanc  has presented today for surgery, with the diagnosis of Colon polyp.  The various methods of treatment have been discussed with the patient and family. After consideration of risks, benefits and other options for treatment, the patient has consented to  Procedure(s): COLONOSCOPY WITH PROPOFOL (N/A) HOT HEMOSTASIS (ARGON PLASMA COAGULATION/BICAP) (N/A) as a surgical intervention.  The patient's history has been reviewed, patient examined, no change in status, stable for surgery.  I have reviewed the patient's chart and labs.  Questions were answered to the patient's satisfaction.     Lear Ng

## 2022-01-03 NOTE — Discharge Instructions (Signed)
YOU HAD AN ENDOSCOPIC PROCEDURE TODAY: Refer to the procedure report and other information in the discharge instructions given to you for any specific questions about what was found during the examination. If this information does not answer your questions, please call [facility] office at [phone] to clarify.   YOU SHOULD EXPECT: Some feelings of bloating in the abdomen. Passage of more gas than usual. Walking can help get rid of the air that was put into your GI tract during the procedure and reduce the bloating. If you had a lower endoscopy (such as a colonoscopy or flexible sigmoidoscopy) you may notice spotting of blood in your stool or on the toilet paper. Some abdominal soreness may be present for a day or two, also.  DIET: Your first meal following the procedure should be a light meal and then it is ok to progress to your normal diet. A half-sandwich or bowl of soup is an example of a good first meal. Heavy or fried foods are harder to digest and may make you feel nauseous or bloated. Drink plenty of fluids but you should avoid alcoholic beverages for 24 hours. If you had a esophageal dilation, please see attached instructions for diet.    ACTIVITY: Your care partner should take you home directly after the procedure. You should plan to take it easy, moving slowly for the rest of the day. You can resume normal activity the day after the procedure however YOU SHOULD NOT DRIVE, use power tools, machinery or perform tasks that involve climbing or major physical exertion for 24 hours (because of the sedation medicines used during the test).   SYMPTOMS TO REPORT IMMEDIATELY: A gastroenterologist can be reached at any hour. Please call [phone]  for any of the following symptoms:  Following lower endoscopy (colonoscopy, flexible sigmoidoscopy) Excessive amounts of blood in the stool  Significant tenderness, worsening of abdominal pains  Swelling of the abdomen that is new, acute  Fever of 100  or higher    FOLLOW UP:  If any biopsies were taken you will be contacted by phone or by letter within the next 1-3 weeks. Call [phone]  if you have not heard about the biopsies in 3 weeks.  Please also call with any specific questions about appointments or follow up tests. YOU HAD AN ENDOSCOPIC PROCEDURE TODAY: Refer to the procedure report and other information in the discharge instructions given to you for any specific questions about what was found during the examination. If this information does not answer your questions, please call [facility] office at [phone] to clarify.   YOU SHOULD EXPECT: Some feelings of bloating in the abdomen. Passage of more gas than usual. Walking can help get rid of the air that was put into your GI tract during the procedure and reduce the bloating. If you had a lower endoscopy (such as a colonoscopy or flexible sigmoidoscopy) you may notice spotting of blood in your stool or on the toilet paper. Some abdominal soreness may be present for a day or two, also.  DIET: Your first meal following the procedure should be a light meal and then it is ok to progress to your normal diet. A half-sandwich or bowl of soup is an example of a good first meal. Heavy or fried foods are harder to digest and may make you feel nauseous or bloated. Drink plenty of fluids but you should avoid alcoholic beverages for 24 hours. If you had a esophageal dilation, please see attached instructions for diet.      ACTIVITY: Your care partner should take you home directly after the procedure. You should plan to take it easy, moving slowly for the rest of the day. You can resume normal activity the day after the procedure however YOU SHOULD NOT DRIVE, use power tools, machinery or perform tasks that involve climbing or major physical exertion for 24 hours (because of the sedation medicines used during the test).   SYMPTOMS TO REPORT IMMEDIATELY: A gastroenterologist can be reached at any hour.  Please call [phone]  for any of the following symptoms:  Following lower endoscopy (colonoscopy, flexible sigmoidoscopy) Excessive amounts of blood in the stool  Significant tenderness, worsening of abdominal pains  Swelling of the abdomen that is new, acute  Fever of 100 or higher    FOLLOW UP:  If any biopsies were taken you will be contacted by phone or by letter within the next 1-3 weeks. Call [phone]  if you have not heard about the biopsies in 3 weeks.  Please also call with any specific questions about appointments or follow up tests.  

## 2022-01-03 NOTE — Anesthesia Postprocedure Evaluation (Signed)
Anesthesia Post Note  Patient: Lee Leblanc  Procedure(s) Performed: COLONOSCOPY WITH PROPOFOL HOT HEMOSTASIS (ARGON PLASMA COAGULATION/BICAP) POLYPECTOMY (Left) BIOPSY     Patient location during evaluation: PACU Anesthesia Type: MAC Level of consciousness: awake Pain management: pain level controlled Vital Signs Assessment: post-procedure vital signs reviewed and stable Respiratory status: spontaneous breathing, nonlabored ventilation and respiratory function stable Cardiovascular status: stable and blood pressure returned to baseline Postop Assessment: no apparent nausea or vomiting Anesthetic complications: no   No notable events documented.  Last Vitals:  Vitals:   01/03/22 0850 01/03/22 0900  BP: 139/81 (!) 149/86  Pulse: 62 (!) 58  Resp: 12 12  Temp:    SpO2: 96% 97%    Last Pain:  Vitals:   01/03/22 0900  TempSrc:   PainSc: 0-No pain                 Nilda Simmer

## 2022-01-03 NOTE — Transfer of Care (Signed)
Immediate Anesthesia Transfer of Care Note  Patient: TAEVION SIKORA  Procedure(s) Performed: COLONOSCOPY WITH PROPOFOL HOT HEMOSTASIS (ARGON PLASMA COAGULATION/BICAP) POLYPECTOMY (Left) BIOPSY  Patient Location: Endoscopy Unit  Anesthesia Type:MAC  Level of Consciousness: awake, oriented, and patient cooperative  Airway & Oxygen Therapy: Patient Spontanous Breathing and Patient connected to face mask oxygen  Post-op Assessment: Report given to RN and Post -op Vital signs reviewed and stable  Post vital signs: Reviewed and stable  Last Vitals:  Vitals Value Taken Time  BP 126/74 01/03/22 0840  Temp 36.3 C 01/03/22 0840  Pulse 63 01/03/22 0844  Resp 17 01/03/22 0844  SpO2 94 % 01/03/22 0844  Vitals shown include unvalidated device data.  Last Pain:  Vitals:   01/03/22 0840  TempSrc: Temporal  PainSc: 0-No pain         Complications: No notable events documented.

## 2022-01-03 NOTE — Consult Note (Signed)
Date of Initial H&P: 12/20/21  History reviewed, patient examined, no change in status, stable for surgery.

## 2022-01-03 NOTE — Op Note (Signed)
[facility] Patient Name: Lee Leblanc Procedure Date: 01/03/2022 MRN: [id number] Attending MD: Lear Ng , MD, [id number] Date of Birth: 1945/03/26 CSN: [id number] Age: 76 Admit Type: Outpatient Procedure:                Colonoscopy Indications:              High risk colon cancer surveillance: Personal                            history of colonic polyps, Last colonoscopy: June                            2023 Providers:                Lear Ng, MD, Burtis Junes, RN, Darliss Cheney,                            Technician Referring MD:             Seward Carol Medicines:                Propofol per Anesthesia, Monitored Anesthesia Care Complications:            No immediate complications. Estimated Blood Loss:     Estimated blood loss was minimal. Procedure:                Pre-Anesthesia Assessment:                           - Prior to the procedure, a History and Physical                            was performed, and patient medications and                            allergies were reviewed. The patient's tolerance of                            previous anesthesia was also reviewed. The risks                            and benefits of the procedure and the sedation                            options and risks were discussed with the patient.                            All questions were answered, and informed consent                            was obtained. Prior Anticoagulants: The patient has                            taken no anticoagulant or antiplatelet agents. ASA  Grade Assessment: III - A patient with severe                            systemic disease. After reviewing the risks and                            benefits, the patient was deemed in satisfactory                            condition to undergo the procedure.                           After obtaining informed consent, the colonoscope                             was passed under direct vision. Throughout the                            procedure, the patient's blood pressure, pulse, and                            oxygen saturations were monitored continuously. The                            PCF-HQ190L ([id number]) Olympus colonoscope was                            introduced through the anus and advanced to the the                            cecum, identified by appendiceal orifice and                            ileocecal valve. The colonoscopy was somewhat                            difficult due to significant looping and a tortuous                            colon. Successful completion of the procedure was                            aided by straightening and shortening the scope to                            obtain bowel loop reduction and applying abdominal                            pressure. The patient tolerated the procedure well.                            The quality of the bowel preparation was good  except the ascending colon was fair and fair but                            repeated irrigation led to a good and adequate                            prep. The ileocecal valve, appendiceal orifice, and                            rectum were photographed. Scope In: 7:53:23 AM Scope Out: 8:31:09 AM Scope Withdrawal Time: 0 hours 29 minutes 59 seconds  Total Procedure Duration: 0 hours 37 minutes 46 seconds  Findings:      The perianal and digital rectal examinations were normal.      A 5 mm polyp was found in the transverse colon. The polyp was       semi-sessile. The polyp was removed with a hot snare. Resection and       retrieval were complete. Estimated blood loss: none.      A tattoo was seen in the ascending colon. Nodular mucosa adjacent to it.       This was biopsied with a cold forceps for histology. Estimated blood       loss was minimal.      A 4 mm polyp was found in the ascending colon. The polyp was  sessile.       The polyp was removed with a cold biopsy forceps. Resection and       retrieval were complete. Estimated blood loss was minimal.      Six sessile and semi-sessile polyps were found in the rectum and sigmoid       colon. The polyps were 4 to 12 mm in size. These polyps were removed       with a hot snare. Resection and retrieval were complete. Estimated blood       loss: none.      Internal hemorrhoids were found during retroflexion. The hemorrhoids       were small and Grade I (internal hemorrhoids that do not prolapse).      A 3 mm polyp was found in the sigmoid colon. The polyp was semi-sessile.       The polyp was removed with a cold biopsy forceps. Resection and       retrieval were complete. Estimated blood loss was minimal. Impression:               - One 5 mm polyp in the transverse colon, removed                            with a hot snare. Resected and retrieved.                           - A tattoo was seen in the ascending colon. Nodular                            mucosa adjacent to it. Biopsied.                           - One 4 mm polyp in the ascending colon, removed  with a cold biopsy forceps. Resected and retrieved.                           - Six 4 to 12 mm polyps in the rectum and in the                            sigmoid colon, removed with a hot snare. Resected                            and retrieved.                           - Internal hemorrhoids.                           - One 3 mm polyp in the sigmoid colon, removed with                            a cold biopsy forceps. Resected and retrieved. Moderate Sedation:      N/A - MAC procedure Recommendation:           - Patient has a contact number available for                            emergencies. The signs and symptoms of potential                            delayed complications were discussed with the                            patient. Return to normal activities  tomorrow.                            Written discharge instructions were provided to the                            patient.                           - High fiber diet.                           - Await pathology results.                           - Repeat colonoscopy for surveillance based on                            pathology results.                           - No ibuprofen, naproxen, or other non-steroidal                            anti-inflammatory drugs for 2 weeks. Procedure Code(s):        ---  Professional ---                           [phone], Colonoscopy, flexible; with removal of                            tumor(s), polyp(s), or other lesion(s) by snare                            technique                           45380, 59, Colonoscopy, flexible; with biopsy,                            single or multiple Diagnosis Code(s):        --- Professional ---                           Z86.010, Personal history of colonic polyps                           D12.3, Benign neoplasm of transverse colon (hepatic                            flexure or splenic flexure)                           D12.2, Benign neoplasm of ascending colon                           D12.5, Benign neoplasm of sigmoid colon                           D12.8, Benign neoplasm of rectum                           K64.0, First degree hemorrhoids CPT copyright 2022 [employer]. All rights reserved. The codes documented in this report are preliminary and upon coder review may  be revised to meet current compliance requirements. Lear Ng, MD 01/03/2022 8:46:09 AM This report has been signed electronically. Number of Addenda: 0

## 2022-01-04 LAB — SURGICAL PATHOLOGY

## 2022-01-06 ENCOUNTER — Encounter (HOSPITAL_COMMUNITY): Payer: Self-pay | Admitting: Gastroenterology

## 2022-02-09 ENCOUNTER — Telehealth: Payer: Self-pay

## 2022-02-09 NOTE — Patient Outreach (Signed)
  Care Coordination   02/09/2022 Name: Lee Leblanc MRN: [id number] DOB: 1945/12/08   Care Coordination Outreach Attempts:  A third unsuccessful outreach was attempted today to offer the patient with information about available care coordination services as a benefit of their health plan.   Follow Up Plan:  No further outreach attempts will be made at this time. We have been unable to contact the patient to offer or enroll patient in care coordination services  Encounter Outcome:  No Answer   Care Coordination Interventions:  No, not indicated    Peter Garter RN, BSN,CCM, [facility] [phone]

## 2022-04-20 ENCOUNTER — Ambulatory Visit (HOSPITAL_COMMUNITY)
Admission: RE | Admit: 2022-04-20 | Discharge: 2022-04-20 | Disposition: A | Discharge status: Home / Self Care | Payer: Medicare HMO | Source: Ambulatory Visit | Attending: Pulmonary Disease | Admitting: Pulmonary Disease

## 2022-04-20 DIAGNOSIS — J432 Centrilobular emphysema: Secondary | ICD-10-CM | POA: Diagnosis not present

## 2022-04-20 DIAGNOSIS — R911 Solitary pulmonary nodule: Secondary | ICD-10-CM | POA: Insufficient documentation

## 2022-04-20 DIAGNOSIS — C3490 Malignant neoplasm of unspecified part of unspecified bronchus or lung: Secondary | ICD-10-CM | POA: Diagnosis not present

## 2022-04-25 ENCOUNTER — Ambulatory Visit: Payer: Medicare HMO | Admitting: Thoracic Surgery (Cardiothoracic Vascular Surgery)

## 2022-04-25 NOTE — Progress Notes (Signed)
FYI, has follow up with HL this Friday. CT was complete prior to seeing him.   Thanks,  BLI  Garner Nash, DO [facility] 04/25/2022 3:50 PM

## 2022-04-28 ENCOUNTER — Ambulatory Visit: Payer: Medicare HMO | Admitting: Thoracic Surgery (Cardiothoracic Vascular Surgery)

## 2022-04-28 ENCOUNTER — Encounter: Payer: Self-pay | Admitting: Thoracic Surgery (Cardiothoracic Vascular Surgery)

## 2022-04-28 VITALS — BP 149/87 | HR 85 | Resp 20 | Ht 74.0 in | Wt 223.0 lb

## 2022-04-28 DIAGNOSIS — Z85118 Personal history of other malignant neoplasm of bronchus and lung: Secondary | ICD-10-CM

## 2022-04-28 NOTE — Progress Notes (Signed)
      [address]       [place] [address]             [phone]        Lee Leblanc [facility] Medical Record [id number] Date of Birth: [birth year]  Referring: Garner Nash, DO Primary Care: Seward Carol, MD Primary Cardiologist:None  Reason for visit:   follow-up  History of Present Illness:     77 year old male presents for his annual surveillance scan.  He has no complaints.  He denies any shortness of breath.  Physical Exam: BP (!) 149/87 (BP Location: Right Arm, Patient Position: Sitting, Cuff Size: Large)   Pulse 85   Resp 20   Ht 6\' 2"  (1.88 m)   Wt 223 lb (101.2 kg)   SpO2 99% Comment: RA  BMI 28.63 kg/m   Alert NAD Abdomen, ND No peripheral edema   Diagnostic Studies & Laboratory data: CT chest:  IMPRESSION: 1. Prior right upper lobectomy with no evidence of recurrent or metastatic disease. 2. 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 Recommend attention on follow-up. 3. Previously described left lower lobe solid pulmonary nodule has resolved. 4. Stable right lower lobe pulmonary nodule. Recommend attention on follow-up. 5. Moderate coronary artery calcifications. 6. Bilateral calcified pleural plaques, findings can be seen in the setting of prior asbestos exposure. 7. Aortic Atherosclerosis (ICD10-I70.0) and Emphysema (ICD10-J43.9).  Assessment / Plan:   77 year old male status postresection of his right upper lobe for stage I adenocarcinoma.  CT scan is stable.  Previous left lower lobe nodules have resolved.  We will continue surveillance with another CT scan in 1 year.  His original operation was in December 2022.   Lajuana Matte 04/28/2022 4:40 PM

## 2022-07-13 DIAGNOSIS — J029 Acute pharyngitis, unspecified: Secondary | ICD-10-CM | POA: Diagnosis not present

## 2022-08-16 DIAGNOSIS — M65331 Trigger finger, right middle finger: Secondary | ICD-10-CM | POA: Diagnosis not present

## 2022-08-16 DIAGNOSIS — M65352 Trigger finger, left little finger: Secondary | ICD-10-CM | POA: Diagnosis not present

## 2022-08-21 DIAGNOSIS — M2021 Hallux rigidus, right foot: Secondary | ICD-10-CM | POA: Diagnosis not present

## 2022-08-21 DIAGNOSIS — M13871 Other specified arthritis, right ankle and foot: Secondary | ICD-10-CM | POA: Diagnosis not present

## 2022-08-21 DIAGNOSIS — M79671 Pain in right foot: Secondary | ICD-10-CM | POA: Diagnosis not present

## 2022-11-06 DIAGNOSIS — M1712 Unilateral primary osteoarthritis, left knee: Secondary | ICD-10-CM | POA: Diagnosis not present

## 2022-11-21 DIAGNOSIS — H2512 Age-related nuclear cataract, left eye: Secondary | ICD-10-CM | POA: Diagnosis not present

## 2022-11-21 DIAGNOSIS — H43811 Vitreous degeneration, right eye: Secondary | ICD-10-CM | POA: Diagnosis not present

## 2022-11-21 DIAGNOSIS — H35033 Hypertensive retinopathy, bilateral: Secondary | ICD-10-CM | POA: Diagnosis not present

## 2022-11-21 DIAGNOSIS — Z961 Presence of intraocular lens: Secondary | ICD-10-CM | POA: Diagnosis not present

## 2022-11-21 DIAGNOSIS — H52223 Regular astigmatism, bilateral: Secondary | ICD-10-CM | POA: Diagnosis not present

## 2022-11-21 DIAGNOSIS — H5203 Hypermetropia, bilateral: Secondary | ICD-10-CM | POA: Diagnosis not present

## 2022-11-27 DIAGNOSIS — Z0100 Encounter for examination of eyes and vision without abnormal findings: Secondary | ICD-10-CM | POA: Diagnosis not present

## 2022-12-11 DIAGNOSIS — Z0000 Encounter for general adult medical examination without abnormal findings: Secondary | ICD-10-CM | POA: Diagnosis not present

## 2022-12-11 DIAGNOSIS — R911 Solitary pulmonary nodule: Secondary | ICD-10-CM | POA: Diagnosis not present

## 2022-12-11 DIAGNOSIS — I700 Atherosclerosis of aorta: Secondary | ICD-10-CM | POA: Diagnosis not present

## 2022-12-11 DIAGNOSIS — J439 Emphysema, unspecified: Secondary | ICD-10-CM | POA: Diagnosis not present

## 2022-12-11 DIAGNOSIS — F324 Major depressive disorder, single episode, in partial remission: Secondary | ICD-10-CM | POA: Diagnosis not present

## 2022-12-11 DIAGNOSIS — Z85118 Personal history of other malignant neoplasm of bronchus and lung: Secondary | ICD-10-CM | POA: Diagnosis not present

## 2022-12-11 DIAGNOSIS — I10 Essential (primary) hypertension: Secondary | ICD-10-CM | POA: Diagnosis not present

## 2022-12-11 DIAGNOSIS — Z5181 Encounter for therapeutic drug level monitoring: Secondary | ICD-10-CM | POA: Diagnosis not present

## 2022-12-11 DIAGNOSIS — Z23 Encounter for immunization: Secondary | ICD-10-CM | POA: Diagnosis not present

## 2023-03-03 ENCOUNTER — Ambulatory Visit (HOSPITAL_COMMUNITY)
Admission: EM | Admit: 2023-03-03 | Discharge: 2023-03-03 | Disposition: A | Discharge status: Home / Self Care | Payer: Medicare HMO | Attending: Family Medicine | Admitting: Family Medicine

## 2023-03-03 ENCOUNTER — Emergency Department (HOSPITAL_COMMUNITY)
Admission: EM | Admit: 2023-03-03 | Discharge: 2023-03-03 | Disposition: A | Discharge status: Home / Self Care | Payer: Medicare HMO | Attending: Emergency Medicine | Admitting: Emergency Medicine

## 2023-03-03 ENCOUNTER — Encounter (HOSPITAL_COMMUNITY): Payer: Self-pay | Admitting: *Deleted

## 2023-03-03 ENCOUNTER — Emergency Department (HOSPITAL_COMMUNITY): Payer: Medicare HMO

## 2023-03-03 ENCOUNTER — Encounter (HOSPITAL_COMMUNITY): Payer: Self-pay

## 2023-03-03 ENCOUNTER — Other Ambulatory Visit: Payer: Self-pay

## 2023-03-03 DIAGNOSIS — R0789 Other chest pain: Secondary | ICD-10-CM | POA: Insufficient documentation

## 2023-03-03 DIAGNOSIS — Z79899 Other long term (current) drug therapy: Secondary | ICD-10-CM | POA: Diagnosis not present

## 2023-03-03 DIAGNOSIS — Z7951 Long term (current) use of inhaled steroids: Secondary | ICD-10-CM | POA: Diagnosis not present

## 2023-03-03 DIAGNOSIS — Z85118 Personal history of other malignant neoplasm of bronchus and lung: Secondary | ICD-10-CM | POA: Insufficient documentation

## 2023-03-03 DIAGNOSIS — R079 Chest pain, unspecified: Secondary | ICD-10-CM | POA: Diagnosis not present

## 2023-03-03 DIAGNOSIS — R0602 Shortness of breath: Secondary | ICD-10-CM | POA: Diagnosis not present

## 2023-03-03 DIAGNOSIS — J449 Chronic obstructive pulmonary disease, unspecified: Secondary | ICD-10-CM | POA: Insufficient documentation

## 2023-03-03 DIAGNOSIS — I10 Essential (primary) hypertension: Secondary | ICD-10-CM | POA: Diagnosis not present

## 2023-03-03 LAB — COMPREHENSIVE METABOLIC PANEL
ALT: 27 U/L (ref 0–44)
AST: 30 U/L (ref 15–41)
Albumin: 3.9 g/dL (ref 3.5–5.0)
Alkaline Phosphatase: 77 U/L (ref 38–126)
Anion gap: 13 (ref 5–15)
BUN: 12 mg/dL (ref 8–23)
CO2: 23 mmol/L (ref 22–32)
Calcium: 9.5 mg/dL (ref 8.9–10.3)
Chloride: 101 mmol/L (ref 98–111)
Creatinine, Ser: 1.34 mg/dL — ABNORMAL HIGH (ref 0.61–1.24)
GFR, Estimated: 55 mL/min — ABNORMAL LOW (ref >60)
Glucose, Bld: 97 mg/dL (ref 70–99)
Potassium: 3.2 mmol/L — ABNORMAL LOW (ref 3.5–5.1)
Sodium: 137 mmol/L (ref 135–145)
Total Bilirubin: 0.7 mg/dL (ref 0.0–1.2)
Total Protein: 7.2 g/dL (ref 6.5–8.1)

## 2023-03-03 LAB — CBC WITH DIFFERENTIAL/PLATELET
Abs Immature Granulocytes: 0.02 10*3/uL (ref 0.00–0.07)
Basophils Absolute: 0 10*3/uL (ref 0.0–0.1)
Basophils Relative: 1 %
Eosinophils Absolute: 0.2 10*3/uL (ref 0.0–0.5)
Eosinophils Relative: 3 %
HCT: 44.2 % (ref 39.0–52.0)
Hemoglobin: 15.9 g/dL (ref 13.0–17.0)
Immature Granulocytes: 0 %
Lymphocytes Relative: 37 %
Lymphs Abs: 2.5 10*3/uL (ref 0.7–4.0)
MCH: 31.5 pg (ref 26.0–34.0)
MCHC: 36 g/dL (ref 30.0–36.0)
MCV: 87.7 fL (ref 80.0–100.0)
Monocytes Absolute: 0.6 10*3/uL (ref 0.1–1.0)
Monocytes Relative: 8 %
Neutro Abs: 3.5 10*3/uL (ref 1.7–7.7)
Neutrophils Relative %: 51 %
Platelets: 315 10*3/uL (ref 150–400)
RBC: 5.04 MIL/uL (ref 4.22–5.81)
RDW: 13.9 % (ref 11.5–15.5)
WBC: 6.8 10*3/uL (ref 4.0–10.5)
nRBC: 0 % (ref 0.0–0.2)

## 2023-03-03 LAB — TROPONIN I (HIGH SENSITIVITY): Troponin I (High Sensitivity): 13 ng/L (ref <18)

## 2023-03-03 MED ORDER — POTASSIUM CHLORIDE CRYS ER 20 MEQ PO TBCR
40.0000 meq | EXTENDED_RELEASE_TABLET | Freq: Once | ORAL | Status: AC
  Administered 2023-03-03: 40 meq via ORAL
  Filled 2023-03-03: qty 2

## 2023-03-03 NOTE — Discharge Instructions (Signed)
You were seen in the emergency department for your chest pain.  Your workup today showed no signs of stress on your heart or heart attack and your chest x-ray was normal.  It is unclear what is causing your pain at this time but you do have some risk factors for heart disease so I have given you a referral to cardiology to follow-up with.  They should be giving you a call in the next few days to schedule an appointment however if you do not hear from them by Tuesday I would give them a call.  You should return to the emergency department if you are having worsening chest pain, severe shortness of breath, that you pass out or if you have any other new or concerning symptoms.

## 2023-03-03 NOTE — ED Provider Triage Note (Signed)
Emergency Medicine Provider Triage Evaluation Note  Lee Leblanc , a 78 y.o. male  was evaluated in triage.  Pt complains of L sided chest pain radiating to arm accompanied by shortness of breath worse when walking starting 6 hours ago. Took BC powder, gas-x, and reflux meds.   Previous Hx of aneurysm measuring 4.5cm in 2023, pulmonary nodule with regular CT monitoring, COPD.   Endorses headache, dysuria with last episode yesterday.  Denies fever, cough, congestion, abdominal pain, n/v/d, leg swelling, leg pain.    Review of Systems  Positive: See above Negative: See above   Physical Exam  BP (!) 143/97 (BP Location: Right Arm)   Pulse 69   Temp 98.1 F (36.7 C)   Resp 18   Ht 6\' 2"  (1.88 m)   Wt 101.2 kg   SpO2 99%   BMI 28.65 kg/m  Gen:   Awake, no distress   Resp:  Normal effort  MSK:   Moves extremities without difficulty  Other:    Medical Decision Making  Medically screening exam initiated at 4:16 PM.  Appropriate orders placed.  Lee Leblanc was informed that the remainder of the evaluation will be completed by another provider, this initial triage assessment does not replace that evaluation, and the importance of remaining in the ED until their evaluation is complete.     Lunette Stands, [place] 03/03/23 1622

## 2023-03-03 NOTE — ED Triage Notes (Signed)
Patient reports that that he was in [employer] and began having chest pain and dizziness around 1015. Patient states it has been constant. Patient states his left fingers "feel a little crazy"/heavy.  Patient denies N/V.

## 2023-03-03 NOTE — ED Provider Notes (Signed)
Patient seen briefly in triage.  About 1 to 2 hours ago he was shopping in [employer] when he began having dizziness, left anterior chest pain and some radiation into his left hand.  Hand felt numb.  His chest is still hurting some and he does still feel dizzy.  Blood pressure here is 155/99 and heart rate is normal.  O2 saturation is normal on room air  EKG shows normal sinus rhythm without any worrisome ST changes  Due to the worrisome symptoms I have asked him to proceed by private car to the emergency room for further evaluation that we cannot accomplish for him in the clinic setting.  His wife will drive him   Zenia Resides, MD 03/03/23 [phone]

## 2023-03-03 NOTE — ED Triage Notes (Signed)
The pt came here from [facility] for treatment for chest pain headache dizziness ?? That just started today ???

## 2023-03-03 NOTE — ED Notes (Signed)
Patient is being discharged from the Urgent Care and sent to the Emergency Department via POV . Per Dr. Marlinda Mike, patient is in need of higher level of care due to chest pain, dizziness.. Patient is aware and verbalizes understanding of plan of care.  Vitals:   03/03/23 1544  BP: (!) 155/99  Pulse: 67  Resp: 16  Temp: 97.7 F (36.5 C)  SpO2: 100%

## 2023-03-03 NOTE — ED Notes (Signed)
.  ucc

## 2023-03-03 NOTE — ED Provider Notes (Signed)
EMERGENCY DEPARTMENT AT [facility] Provider Note   CSN: [id number] Arrival date & time: 03/03/23  1557     History  Chief Complaint  Patient presents with   Chest Pain    Lee Leblanc is a 78 y.o. male.  Patient is a 78 year old male with past medical history of COPD, hypertension, prior lung cancer with lobectomy presenting to the emergency department with chest pain.  Patient is here with his wife who states that they were shopping at [facility] around 1030 this morning when he started to have some pressure on the left side of his chest.  He states that the pain went down his left arm and had some tingling in his left fingers.  He states when he woke up this morning he did have a mild headache with associated dizziness that he took Tylenol.  He states this was not a severe headache and had no associated nausea, vomiting, vision changes or numbness or weakness with the headache.  He states that he tried taking antacids and Tylenol at home with some relief.  He states that he initially went to urgent care who recommended that they come to the ED for further evaluation.  He denies any known cardiac history.  Denies any recent hospitalization or surgery, recent long travel in the car or plane, active cancer in the last 6 months or hormone use.  The history is provided by the patient and the spouse.  Chest Pain      Home Medications Prior to Admission medications   Medication Sig Start Date End Date Taking? Authorizing Provider  acetaminophen (TYLENOL) 500 MG tablet Take 1,000 mg by mouth every 6 (six) hours as needed (pain.).    [provider]  amLODipine (NORVASC) 5 MG tablet Take 5 mg by mouth every evening.    [provider]  cetirizine (ZYRTEC) 10 MG tablet Take 10 mg by mouth daily as needed for allergies.    [provider]  citalopram (CELEXA) 20 MG tablet Take 20 mg by mouth in the morning.    [provider]   diphenhydrAMINE (BENADRYL) 25 MG tablet Take 25 mg by mouth daily as needed for allergies.    [provider]  fluticasone (FLONASE) 50 MCG/ACT nasal spray Place 1 spray into both nostrils in the morning.    [provider]  Fluticasone-Umeclidin-Vilant (TRELEGY ELLIPTA) 100-62.5-25 MCG/ACT AEPB Inhale 1 puff into the lungs daily. Patient taking differently: Inhale 1 puff into the lungs daily as needed (respiratory issues). 08/15/21   Icard, Elige Radon L, DO  Lidocaine 5 % CREA Apply 1 Application topically 3 (three) times daily as needed (pain.).    [provider]  losartan-hydrochlorothiazide (HYZAAR) 100-25 MG per tablet Take 1 tablet by mouth in the morning.    [provider]  montelukast (SINGULAIR) 10 MG tablet Take 10 mg by mouth at bedtime.    [provider]  pantoprazole (PROTONIX) 40 MG tablet Take 40 mg by mouth at bedtime. 10/14/20   [provider]  Propylene Glycol-Glycerin (SOOTHE) 0.6-0.6 % SOLN Place 1 drop into both eyes 2 (two) times daily as needed (dry/irritated eyes).    [provider]  simethicone (MYLICON) 125 MG chewable tablet Chew 125 mg by mouth every 6 (six) hours as needed for flatulence.    [provider]  traZODone (DESYREL) 50 MG tablet Take 100 mg by mouth at bedtime. 07/20/19   [provider]  Allergies    Patient has no known allergies.    Review of Systems   Review of Systems  Cardiovascular:  Positive for chest pain.    Physical Exam Updated Vital Signs BP (!) 141/88   Pulse 62   Temp 98.1 F (36.7 C)   Resp 16   Ht 6\' 2"  (1.88 m)   Wt 101.2 kg   SpO2 100%   BMI 28.65 kg/m  Physical Exam Vitals and nursing note reviewed.  Constitutional:      General: He is not in acute distress.    Appearance: He is well-developed.  HENT:     Head: Normocephalic and atraumatic.  Eyes:     Extraocular Movements: Extraocular movements intact.  Cardiovascular:     Rate  and Rhythm: Normal rate and regular rhythm.     Pulses:          Radial pulses are 2+ on the right side and 2+ on the left side.     Heart sounds: Normal heart sounds.  Pulmonary:     Effort: Pulmonary effort is normal.     Breath sounds: Normal breath sounds.  Chest:     Chest wall: No tenderness.  Abdominal:     Palpations: Abdomen is soft.     Tenderness: There is no abdominal tenderness.  Musculoskeletal:        General: Normal range of motion.     Cervical back: Normal range of motion and neck supple.     Right lower leg: No edema.     Left lower leg: No edema.  Skin:    General: Skin is warm and dry.  Neurological:     General: No focal deficit present.     Mental Status: He is alert and oriented to person, place, and time.     Motor: No weakness.  Psychiatric:        Mood and Affect: Mood normal.        Behavior: Behavior normal.     ED Results / Procedures / Treatments   Labs (all labs ordered are listed, but only abnormal results are displayed) Labs Reviewed  COMPREHENSIVE METABOLIC PANEL - Abnormal; Notable for the following components:      Result Value   Potassium 3.2 (*)    Creatinine, Ser 1.34 (*)    GFR, Estimated 55 (*)    All other components within normal limits  CBC WITH DIFFERENTIAL/PLATELET  TROPONIN I (HIGH SENSITIVITY)    EKG None  Radiology DG Chest 2 View Result Date: 03/03/2023 CLINICAL DATA:  Chest pain.  Shortness of breath. EXAM: CHEST - 2 VIEW COMPARISON:  02/25/2021. FINDINGS: There are nonspecific faint opacities overlying the left hemithorax, which corresponds to pleural calcifications on the prior CT scan, which are nonspecific but commonly seen with asbestos related disease. There are also pleural calcifications along the left diaphragmatic crura. Bilateral lung fields are otherwise clear. Bilateral costophrenic angles are clear. Normal cardio-mediastinal silhouette. No acute osseous abnormalities. The soft tissues are within normal  limits. IMPRESSION: *No active cardiopulmonary disease. *Pleural calcifications. Electronically Signed   By: Jules Schick M.D.   On: 03/03/2023 17:49    Procedures Procedures    Medications Ordered in ED Medications  potassium chloride SA (KLOR-CON M) CR tablet 40 mEq (40 mEq Oral Given 03/03/23 1832)    ED Course/ Medical Decision Making/ A&P Clinical Course as of 03/03/23 1859  Sat Mar 03, 2023  1801 Cr at baseline, mild hypokalemia will be repleted. Initial troponin  negative. Symptoms ongoing since ~10:30 AM this morning so single troponin is sufficient. [VK]  1802 No acute abnormality on CXR. [VK]  1857 Patient is stable for discharge home. Do recommend cardiology follow up with risk factors and was given strict return precautions. [VK]    Clinical Course User Index [VK] Rexford Maus, DO                                 Medical Decision Making This patient presents to the ED with chief complaint(s) of chest pain with pertinent past medical history of hypertension, COPD, prior lung cancer which further complicates the presenting complaint. The complaint involves an extensive differential diagnosis and also carries with it a high risk of complications and morbidity.    The differential diagnosis includes ACS, arrhythmia, anemia, pneumonia, pneumothorax, pulmonary edema, pleural effusion, gastritis, GERD  Additional history obtained: Additional history obtained from family Records reviewed outpatient CT surgery records  ED Course and Reassessment: On patient's arrival he is hemodynamically stable in no acute distress.  EKG showed normal sinus rhythm without acute ischemic changes.  Patient's had labs including troponin and chest x-ray ordered from triage that are pending at this time.  He states that his pain has essentially resolved and declines any additional pain control and will be closely reassessed.  Independent labs interpretation:  The following labs were  independently interpreted: Mild hypokalemia otherwise no acute abnormality  Independent visualization of imaging: - I independently visualized the following imaging with scope of interpretation limited to determining acute life threatening conditions related to emergency care: Chest x-ray, which revealed no acute disease  Consultation: - Consulted or discussed management/test interpretation w/ external professional: N/A  Consideration for admission or further workup: Patient has no emergent conditions requiring admission or further work-up at this time and is stable for discharge home with primary care and cardiology follow-up  Social Determinants of health: N/A    Risk Prescription drug management.          Final Clinical Impression(s) / ED Diagnoses Final diagnoses:  Nonspecific chest pain    Rx / DC Orders ED Discharge Orders          Ordered    Ambulatory referral to Cardiology       Comments: If you have not heard from the Cardiology office within the next 72 hours please call [phone].   03/03/23 1858              Rexford Maus, DO 03/03/23 1859

## 2023-03-08 DIAGNOSIS — R079 Chest pain, unspecified: Secondary | ICD-10-CM | POA: Diagnosis not present

## 2023-03-08 DIAGNOSIS — R0981 Nasal congestion: Secondary | ICD-10-CM | POA: Diagnosis not present

## 2023-03-08 DIAGNOSIS — E876 Hypokalemia: Secondary | ICD-10-CM | POA: Diagnosis not present

## 2023-03-16 ENCOUNTER — Other Ambulatory Visit: Payer: Self-pay | Admitting: Thoracic Surgery (Cardiothoracic Vascular Surgery)

## 2023-03-16 DIAGNOSIS — C3490 Malignant neoplasm of unspecified part of unspecified bronchus or lung: Secondary | ICD-10-CM

## 2023-03-26 ENCOUNTER — Encounter: Payer: Self-pay | Admitting: Thoracic Surgery (Cardiothoracic Vascular Surgery)

## 2023-04-20 ENCOUNTER — Ambulatory Visit
Admission: RE | Admit: 2023-04-20 | Discharge: 2023-04-20 | Disposition: A | Discharge status: Home / Self Care | Payer: Medicare HMO | Source: Ambulatory Visit | Attending: Thoracic Surgery (Cardiothoracic Vascular Surgery) | Admitting: Thoracic Surgery (Cardiothoracic Vascular Surgery)

## 2023-04-20 DIAGNOSIS — I700 Atherosclerosis of aorta: Secondary | ICD-10-CM | POA: Diagnosis not present

## 2023-04-20 DIAGNOSIS — R918 Other nonspecific abnormal finding of lung field: Secondary | ICD-10-CM | POA: Diagnosis not present

## 2023-04-20 DIAGNOSIS — C3490 Malignant neoplasm of unspecified part of unspecified bronchus or lung: Secondary | ICD-10-CM

## 2023-04-24 DIAGNOSIS — K219 Gastro-esophageal reflux disease without esophagitis: Secondary | ICD-10-CM | POA: Diagnosis not present

## 2023-04-24 DIAGNOSIS — J342 Deviated nasal septum: Secondary | ICD-10-CM | POA: Diagnosis not present

## 2023-04-24 DIAGNOSIS — J324 Chronic pansinusitis: Secondary | ICD-10-CM | POA: Diagnosis not present

## 2023-04-24 DIAGNOSIS — J343 Hypertrophy of nasal turbinates: Secondary | ICD-10-CM | POA: Diagnosis not present

## 2023-04-24 DIAGNOSIS — R519 Headache, unspecified: Secondary | ICD-10-CM | POA: Diagnosis not present

## 2023-04-24 DIAGNOSIS — J309 Allergic rhinitis, unspecified: Secondary | ICD-10-CM | POA: Diagnosis not present

## 2023-04-24 DIAGNOSIS — R0982 Postnasal drip: Secondary | ICD-10-CM | POA: Diagnosis not present

## 2023-04-24 DIAGNOSIS — J3489 Other specified disorders of nose and nasal sinuses: Secondary | ICD-10-CM | POA: Diagnosis not present

## 2023-04-27 ENCOUNTER — Telehealth: Payer: Medicare HMO | Admitting: Thoracic Surgery (Cardiothoracic Vascular Surgery)

## 2023-04-27 NOTE — Progress Notes (Unsigned)
     [address]       [place] [address]             [phone]       Patient: Home Provider: Office Consent for Telemedicine visit obtained.  Today's visit was completed via a real-time telehealth (see specific modality noted below). The patient/authorized person provided oral consent at the time of the visit to engage in a telemedicine encounter with the present provider at [facility]. The patient/authorized person was informed of the potential benefits, limitations, and risks of telemedicine. The patient/authorized person expressed understanding that the laws that protect confidentiality also apply to telemedicine. The patient/authorized person acknowledged understanding that telemedicine does not provide emergency services and that he or she would need to call 911 or proceed to the nearest hospital for help if such a need arose.   Total time spent in the clinical discussion 10 minutes.  Telehealth Modality: Phone visit (audio only)  I had a telephone visit with Mr. Saber

## 2023-05-21 ENCOUNTER — Ambulatory Visit: Payer: Medicare HMO | Admitting: Cardiovascular Disease

## 2023-06-09 IMAGING — CR DG ABDOMEN 2V
3 series · 3 of 3 positions shown · non-contrast
Comparison: 12/09/2015

CLINICAL DATA: Generalized abdominal pain, distension

EXAM:
ABDOMEN - 2 VIEW

[w abdomen upright *]
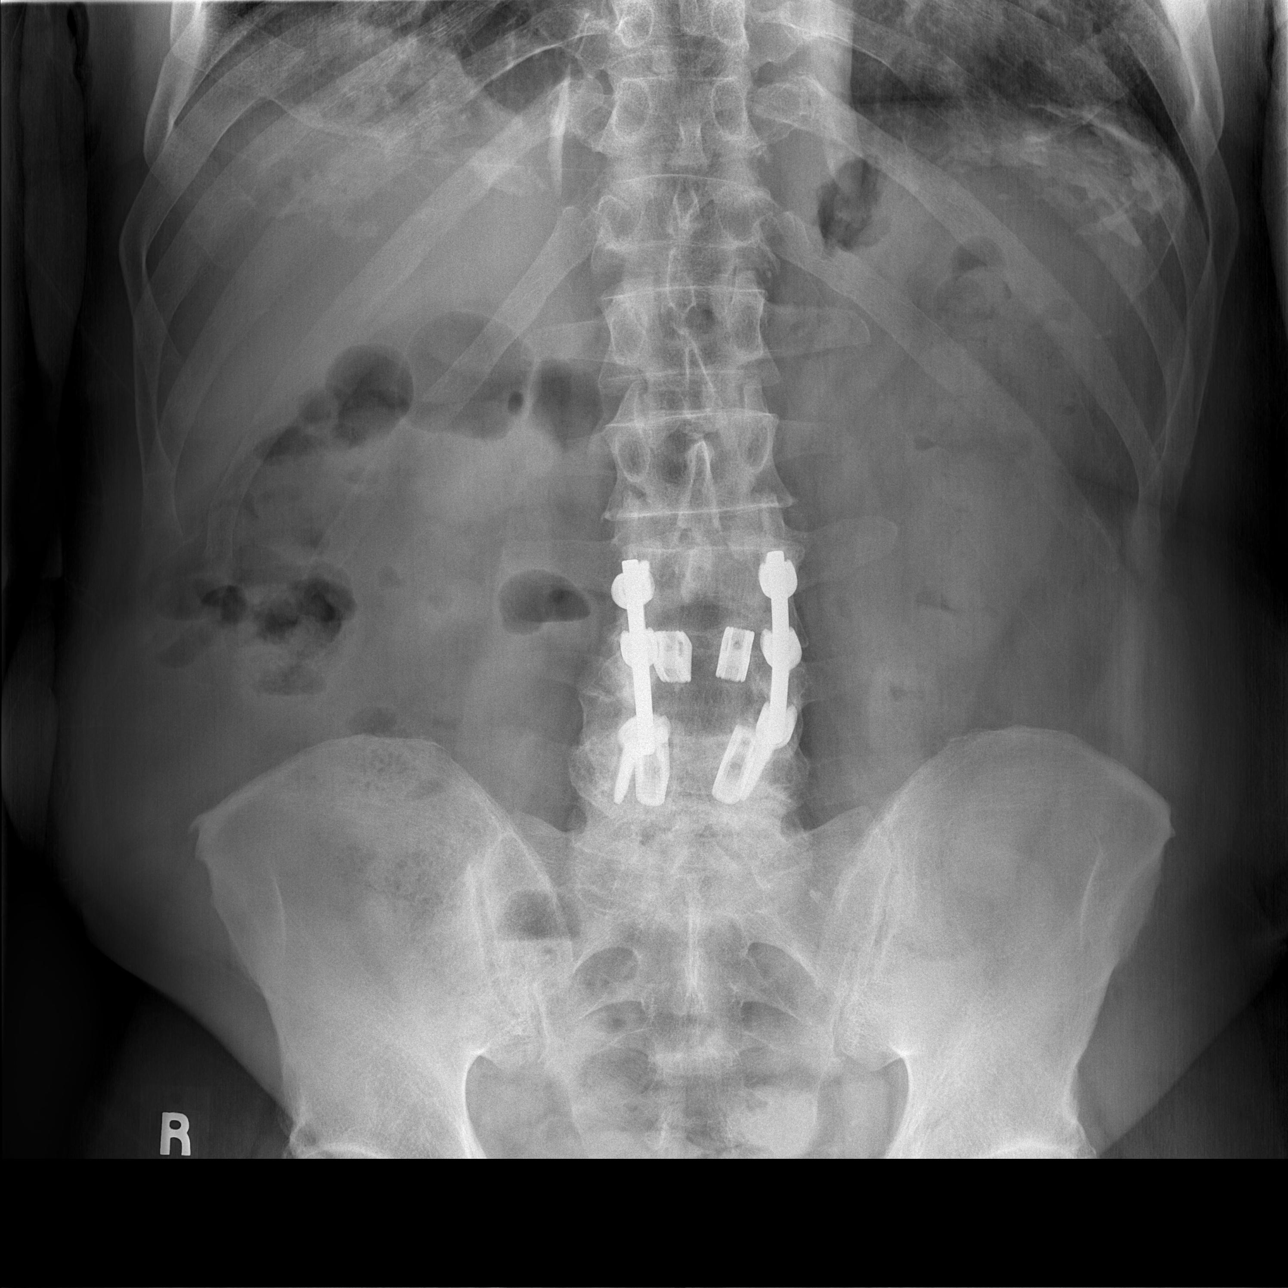

[t abdomen supine (1 of 2)]
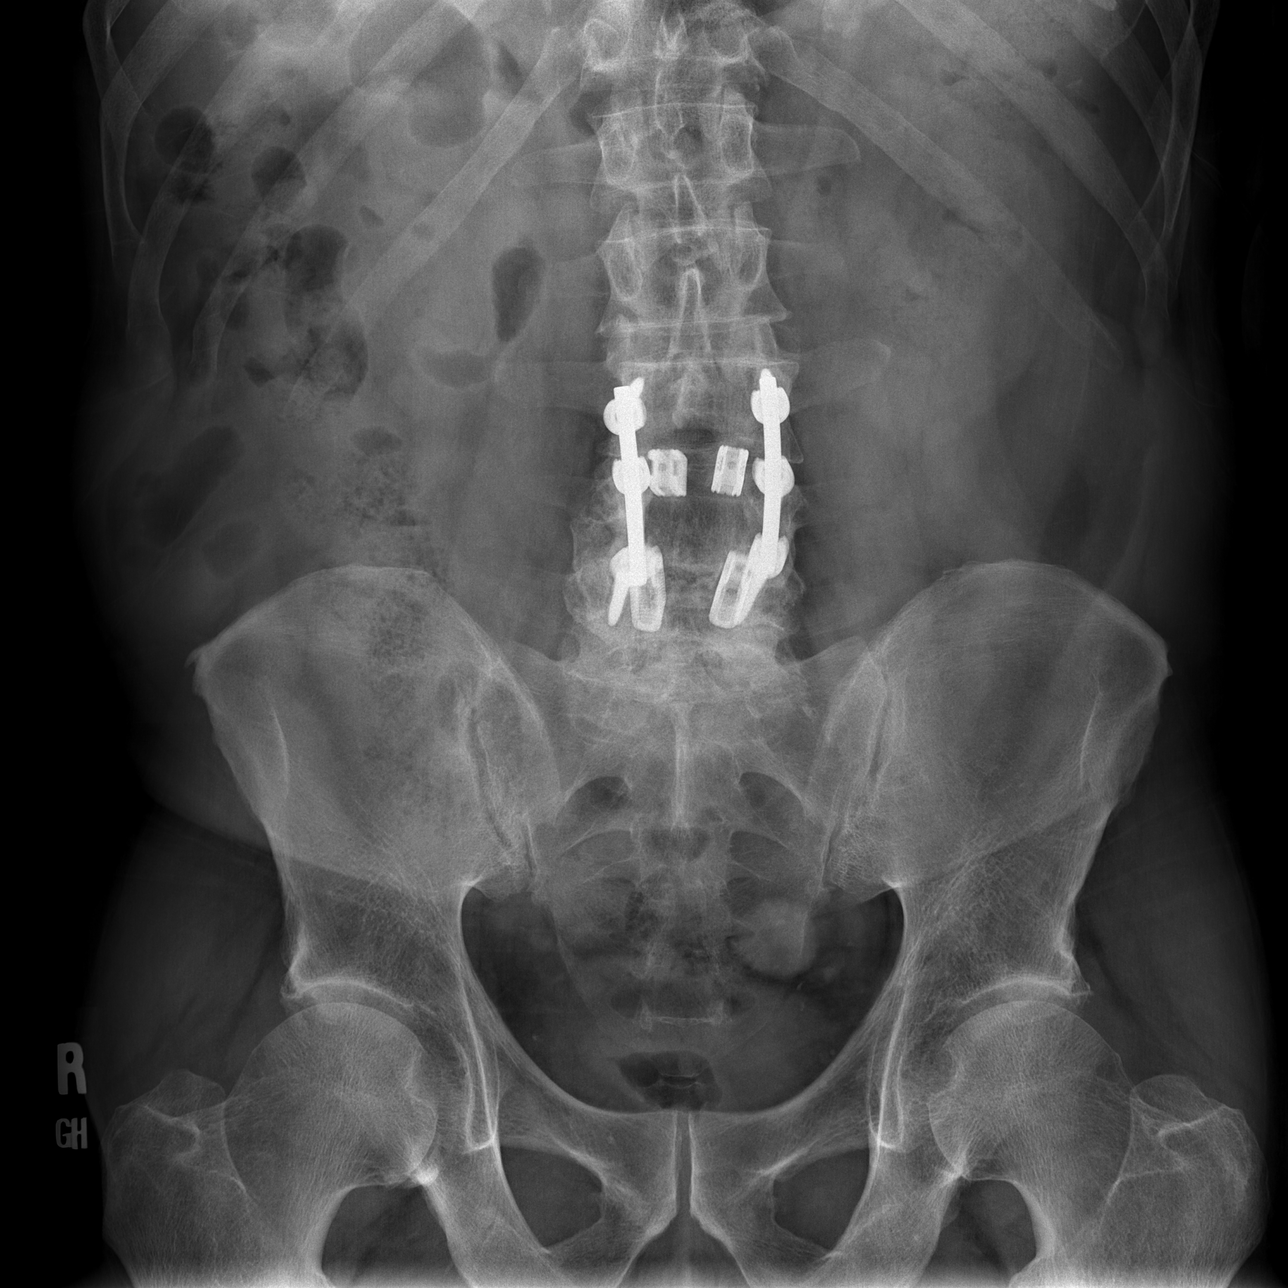

[t abdomen supine (2 of 2)]
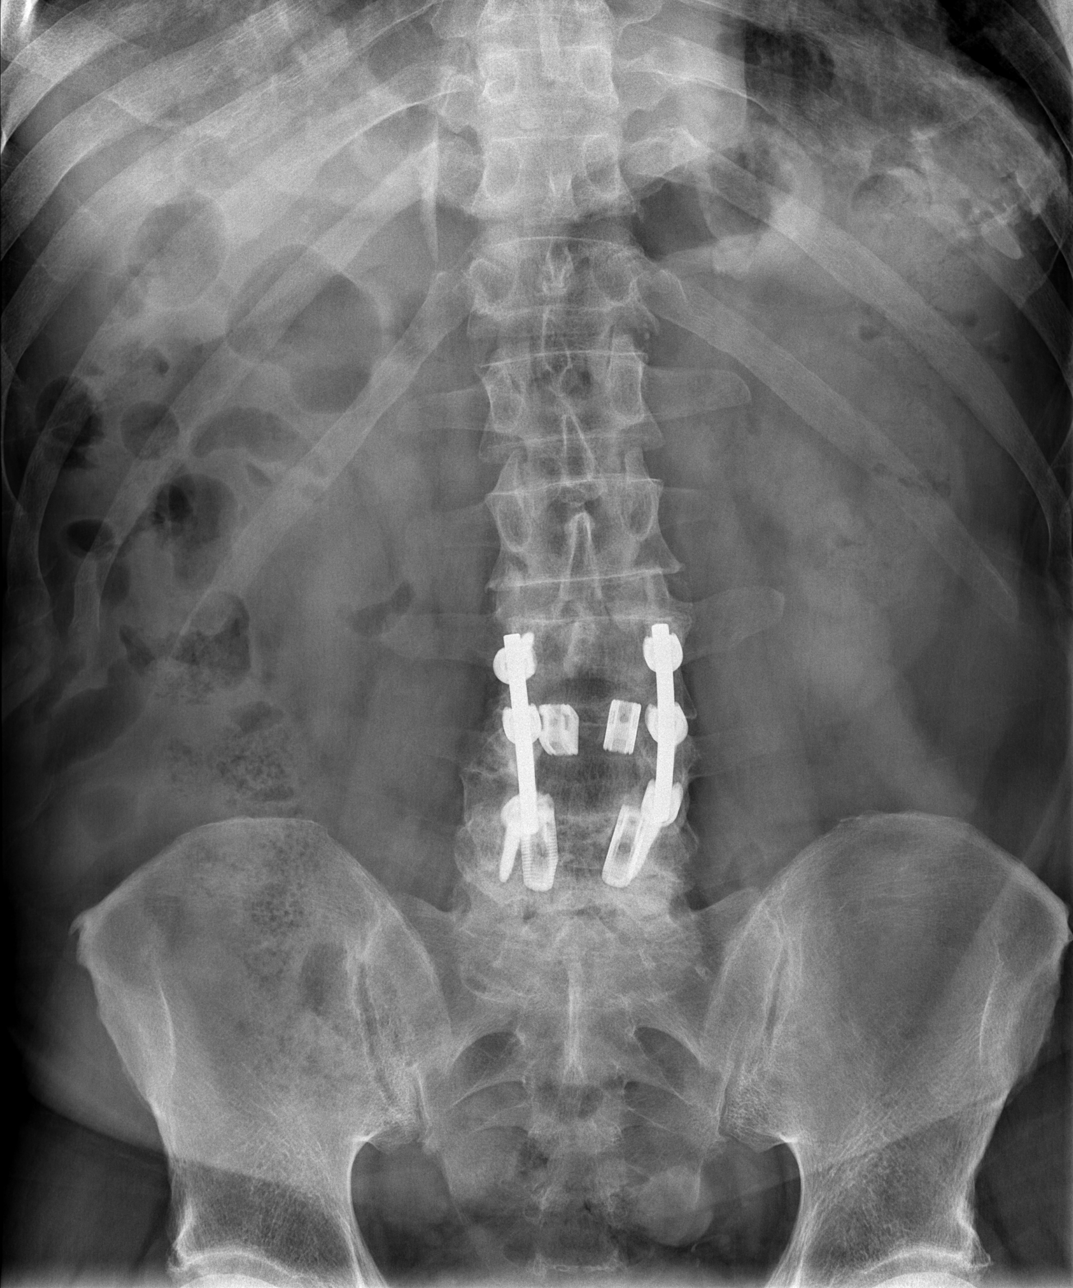

[3 of 3 positions shown; findings below may reference images not displayed]

FINDINGS: Supine and upright frontal views of the abdomen and pelvis are
obtained. Bowel gas pattern is unremarkable without obstruction or
ileus. No masses or abnormal calcifications. No free gas in the
greater peritoneal sac. Bilateral pleural calcifications are seen at
the lung bases. L3 through L5 discectomy and posterior fusion.
IMPRESSION: 1. Unremarkable bowel gas pattern.

## 2023-06-29 DIAGNOSIS — S161XXA Strain of muscle, fascia and tendon at neck level, initial encounter: Secondary | ICD-10-CM | POA: Diagnosis not present

## 2023-06-29 DIAGNOSIS — M5442 Lumbago with sciatica, left side: Secondary | ICD-10-CM | POA: Diagnosis not present

## 2023-06-29 DIAGNOSIS — R600 Localized edema: Secondary | ICD-10-CM | POA: Diagnosis not present

## 2023-07-01 IMAGING — CR DG CHEST 2V
2 series · 2 of 2 positions shown · non-contrast
Comparison: 02/03/2021

CLINICAL DATA: History of lung nodule.

EXAM:
CHEST - 2 VIEW

[w chest pa]
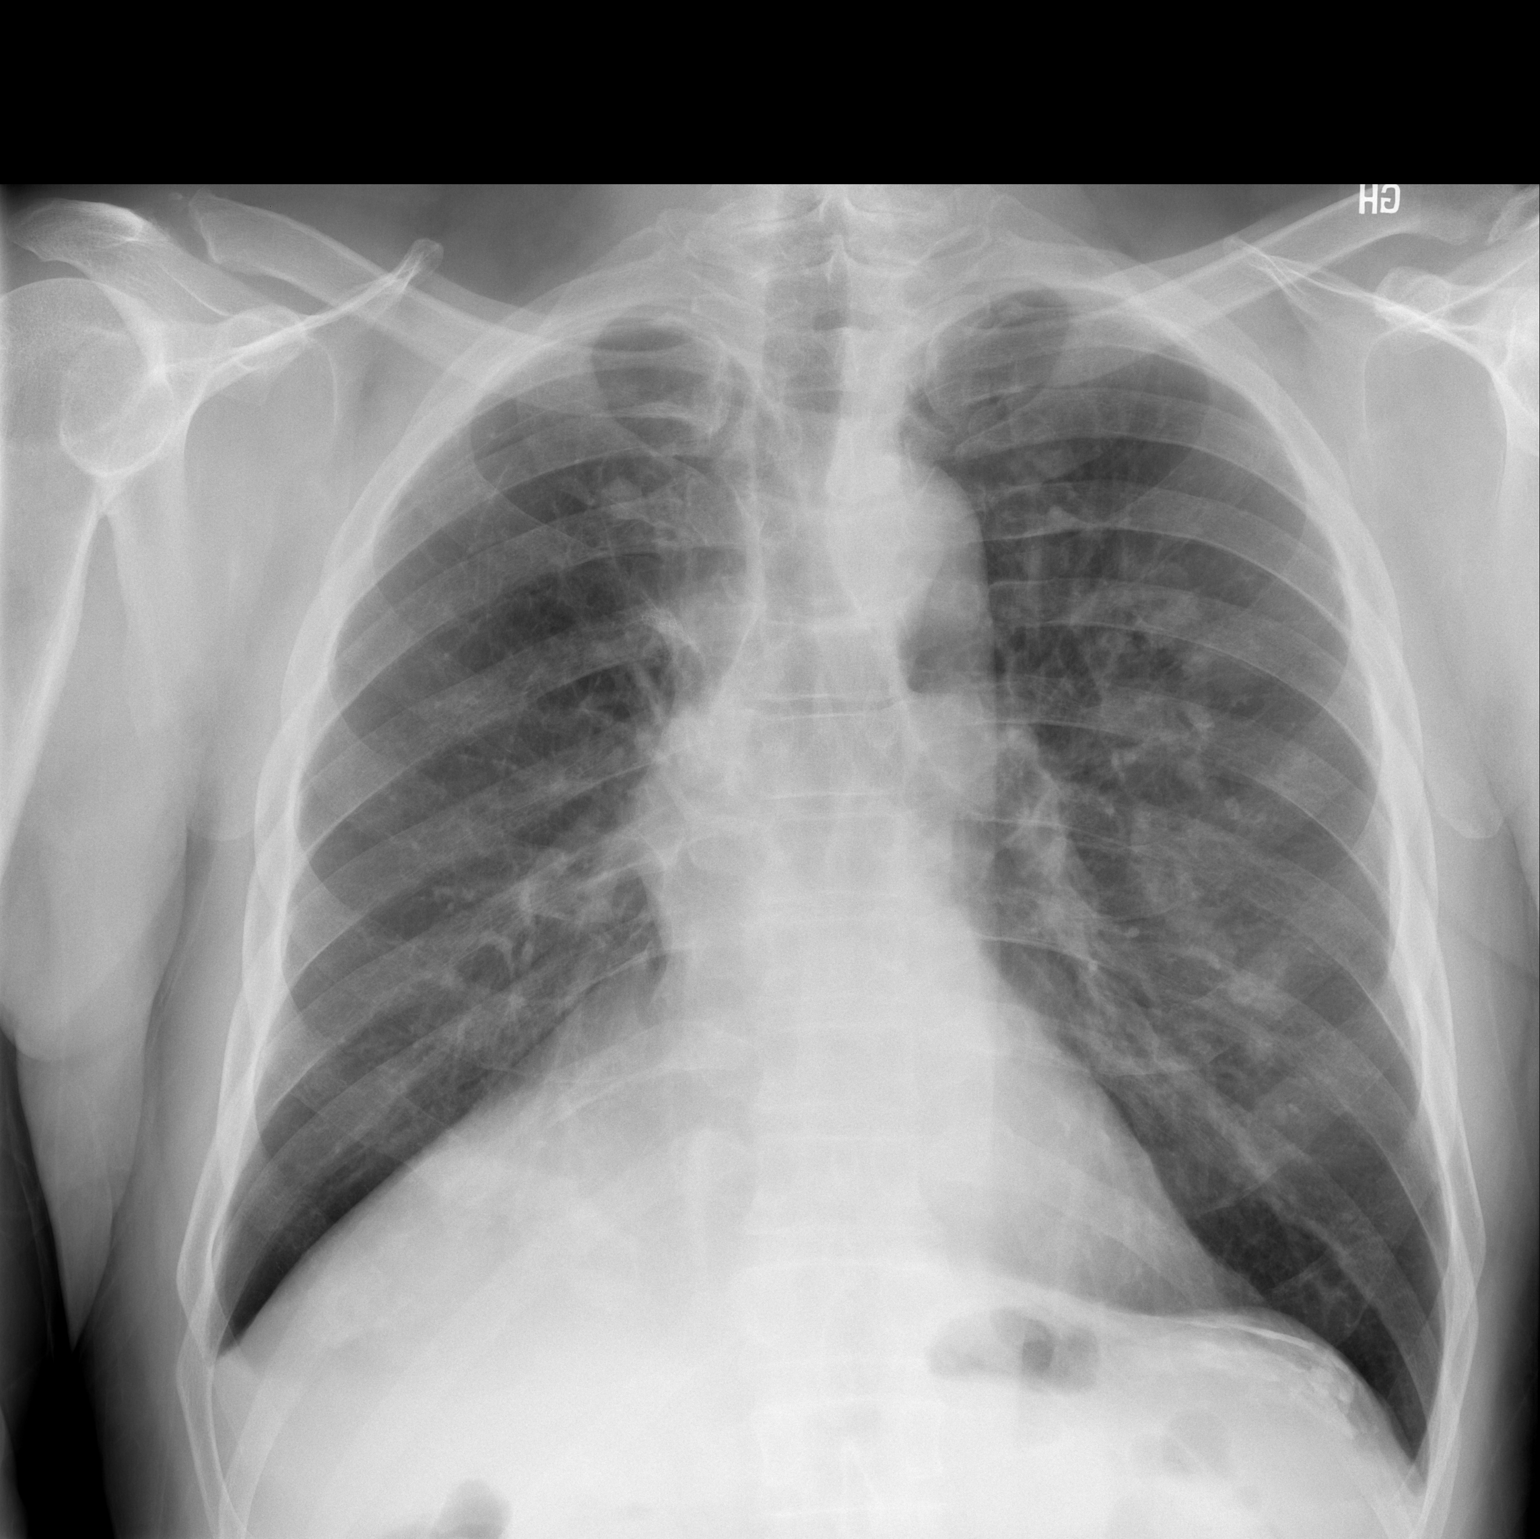

[w chest lat]
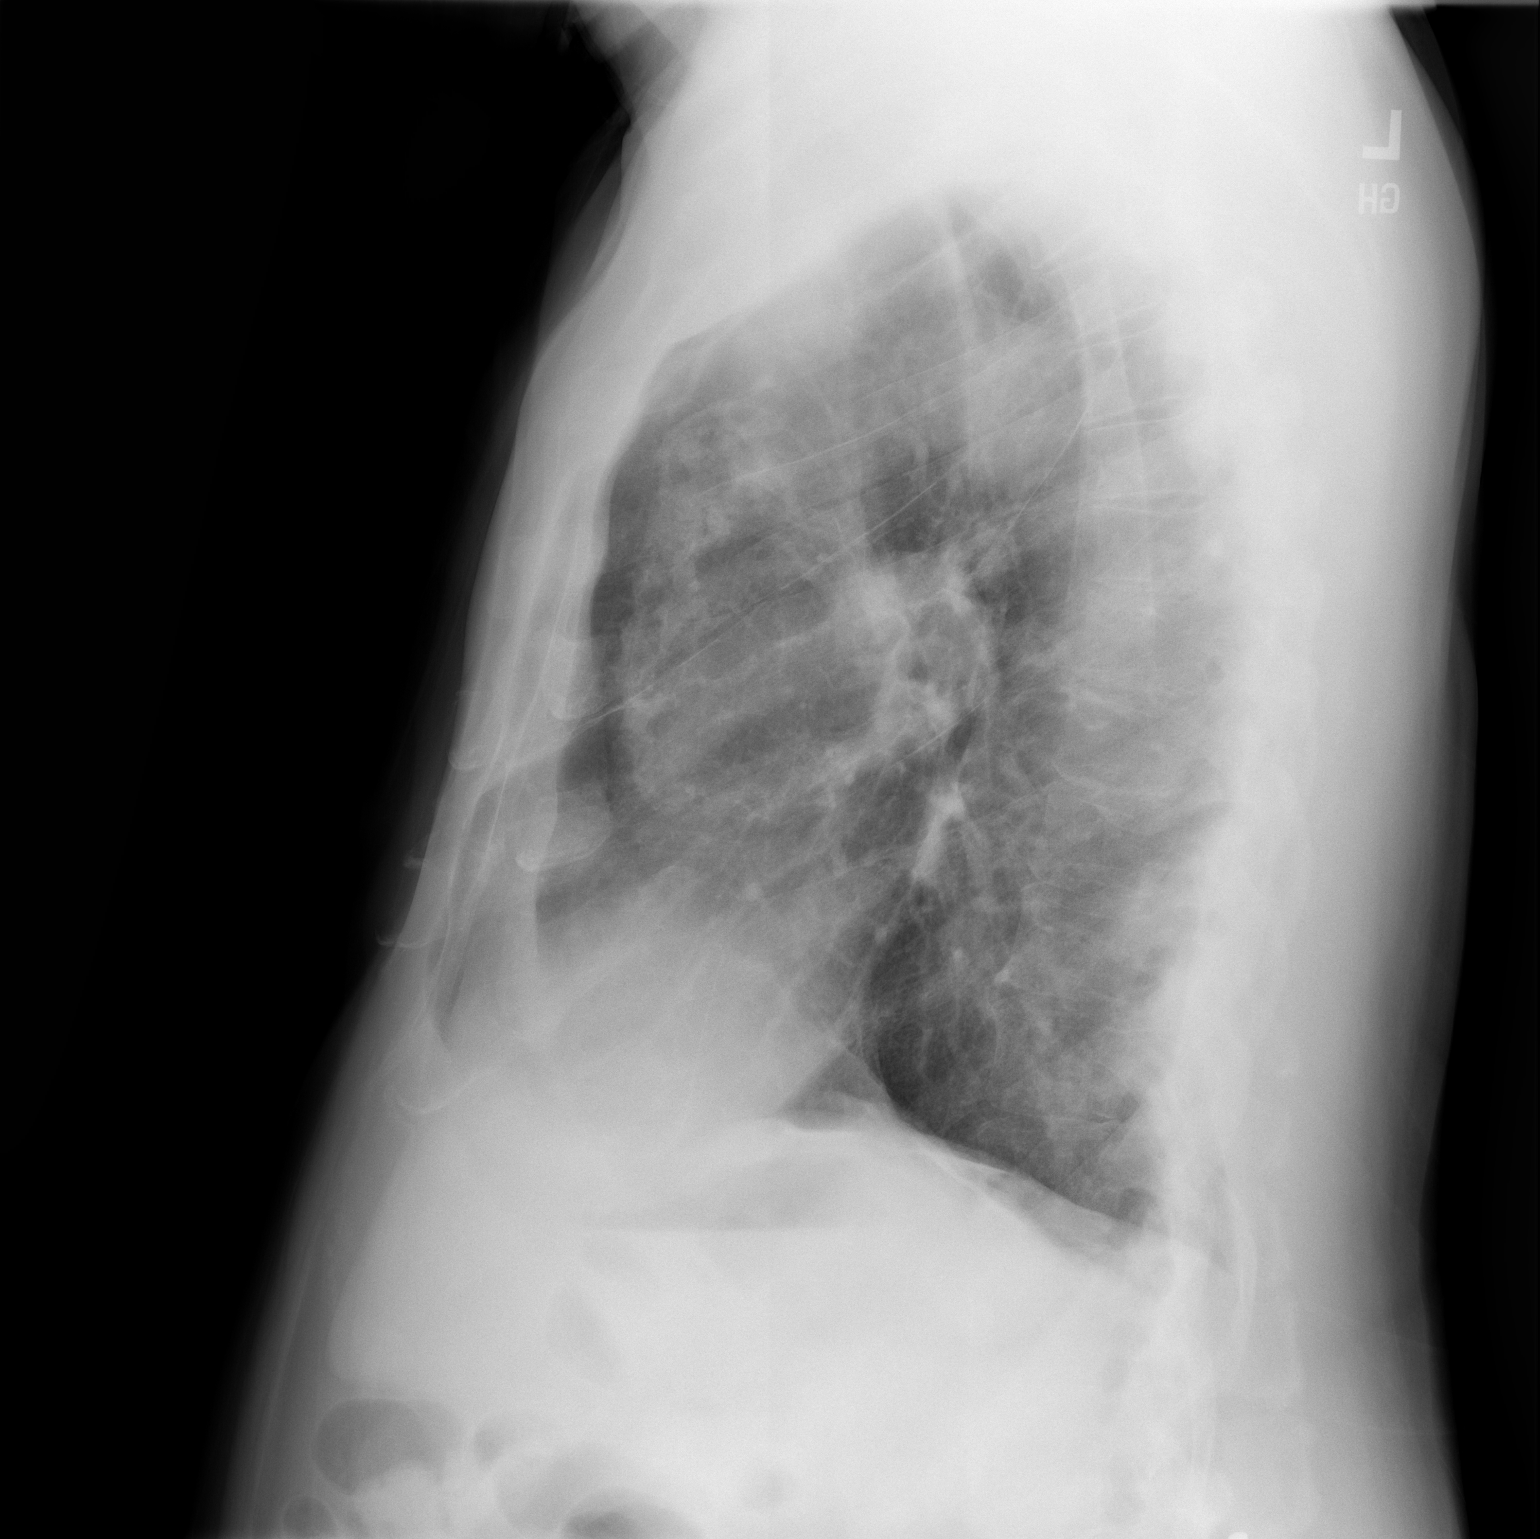

[2 of 2 positions shown; findings below may reference images not displayed]

FINDINGS: Calcified bilateral pleural plaque. No focal consolidation. Stable
small right apical pneumothorax unchanged from the prior exam. Small
nodular opacity in the right upper lobe unchanged in the prior exam.
Heart and mediastinal contours are unremarkable.

No acute osseous abnormality.
IMPRESSION: Stable small right apical pneumothorax unchanged from the prior
exam.

## 2023-09-11 DIAGNOSIS — M51362 Other intervertebral disc degeneration, lumbar region with discogenic back pain and lower extremity pain: Secondary | ICD-10-CM | POA: Diagnosis not present

## 2023-09-11 DIAGNOSIS — M4316 Spondylolisthesis, lumbar region: Secondary | ICD-10-CM | POA: Diagnosis not present

## 2023-09-11 DIAGNOSIS — M48062 Spinal stenosis, lumbar region with neurogenic claudication: Secondary | ICD-10-CM | POA: Diagnosis not present

## 2023-09-11 DIAGNOSIS — M47812 Spondylosis without myelopathy or radiculopathy, cervical region: Secondary | ICD-10-CM | POA: Diagnosis not present

## 2023-09-13 ENCOUNTER — Other Ambulatory Visit: Payer: Self-pay | Admitting: Neurosurgery

## 2023-09-13 ENCOUNTER — Other Ambulatory Visit: Payer: Self-pay

## 2023-09-13 ENCOUNTER — Encounter: Payer: Self-pay | Admitting: Neurology

## 2023-09-13 DIAGNOSIS — R202 Paresthesia of skin: Secondary | ICD-10-CM

## 2023-09-13 DIAGNOSIS — M48062 Spinal stenosis, lumbar region with neurogenic claudication: Secondary | ICD-10-CM

## 2023-09-15 ENCOUNTER — Ambulatory Visit
Admission: RE | Admit: 2023-09-15 | Discharge: 2023-09-15 | Disposition: A | Discharge status: Home / Self Care | Source: Ambulatory Visit | Attending: Neurosurgery | Admitting: Neurosurgery

## 2023-09-15 DIAGNOSIS — M48061 Spinal stenosis, lumbar region without neurogenic claudication: Secondary | ICD-10-CM | POA: Diagnosis not present

## 2023-09-15 DIAGNOSIS — M48062 Spinal stenosis, lumbar region with neurogenic claudication: Secondary | ICD-10-CM

## 2023-09-15 DIAGNOSIS — M5127 Other intervertebral disc displacement, lumbosacral region: Secondary | ICD-10-CM | POA: Diagnosis not present

## 2023-09-15 MED ORDER — GADOPICLENOL 0.5 MMOL/ML IV SOLN
10.0000 mL | Freq: Once | INTRAVENOUS | Status: AC | PRN
  Administered 2023-09-15: 10 mL via INTRAVENOUS

## 2023-10-18 ENCOUNTER — Ambulatory Visit: Admitting: Neurology

## 2023-10-18 DIAGNOSIS — R202 Paresthesia of skin: Secondary | ICD-10-CM

## 2023-10-18 DIAGNOSIS — M5412 Radiculopathy, cervical region: Secondary | ICD-10-CM

## 2023-10-18 NOTE — Procedures (Signed)
 [facility] Neurology  [address] [place], [address]  [place], [place] [address] Tel: [phone] Fax: [phone] Test Date:  10/18/2023  Patient: Lee Leblanc DOB: 01-05-1946 Physician: Tonita Blanch, DO  Sex: Male Height: 6' 2 Ref Phys: Rockey Peru, MD  ID#: [id number]   Technician:    History: This is a 78 year old man referred for evaluation of bilateral upper extremity paresthesias and pain.  NCV & EMG Findings: Extensive electrodiagnostic testing of the right upper extremity and additional studies of the left shows: Bilateral median, ulnar, and mixed palmar sensory responses are within normal limits. Bilateral median and ulnar motor responses are within normal limits. Chronic motor axon loss changes are seen affecting bilateral biceps and deltoid muscles, without accompanying active denervation.  Impression: Chronic C5-C6 radiculopathy affecting bilateral upper extremities, mild. There is no evidence of carpal tunnel syndrome or ulnar neuropathy affecting the upper extremities.   ___________________________ Tonita Blanch, DO    Nerve Conduction Studies   Stim Site NR Peak (ms) Norm Peak (ms) O-P Amp (V) Norm O-P Amp  Left Median Anti Sensory (2nd Digit)  32 C  Wrist    3.4 <3.8 11.3 >10  Right Median Anti Sensory (2nd Digit)  32 C  Wrist    3.3 <3.8 13.2 >10  Left Ulnar Anti Sensory (5th Digit)  32 C  Wrist    3.0 <3.2 10.5 >5  Right Ulnar Anti Sensory (5th Digit)  32 C  Wrist    3.0 <3.2 8.3 >5     Stim Site NR Onset (ms) Norm Onset (ms) O-P Amp (mV) Norm O-P Amp Site1 Site2 Delta-0 (ms) Dist (cm) Vel (m/s) Norm Vel (m/s)  Left Median Motor (Abd Poll Brev)  32 C  Wrist    3.4 <4.0 13.0 >5 Elbow Wrist 6.1 35.0 57 >50  Elbow    9.5  12.2         Right Median Motor (Abd Poll Brev)  32 C  Wrist    3.4 <4.0 14.9 >5 Elbow Wrist 6.0 35.0 58 >50  Elbow    9.4  13.7         Left Ulnar Motor (Abd Dig Minimi)  32 C  Wrist    2.3 <3.1 11.0 >7 B Elbow Wrist 4.7  28.0 60 >50  B Elbow    7.0  10.8  A Elbow B Elbow 1.8 10.0 56 >50  A Elbow    8.8  10.6         Right Ulnar Motor (Abd Dig Minimi)  32 C  Wrist    2.8 <3.1 12.2 >7 B Elbow Wrist 4.5 25.0 56 >50  B Elbow    7.3  12.0  A Elbow B Elbow 1.6 10.0 62 >50  A Elbow    8.9  11.7            Stim Site NR Peak (ms) Norm Peak (ms) P-T Amp (V) Site1 Site2 Delta-P (ms) Norm Delta (ms)  Left Median/Ulnar Palm Comparison (Wrist - 8cm)  32 C  Median Palm    1.9 <2.2 26.5 Median Palm Ulnar Palm 0.2   Ulnar Palm    2.1 <2.2 8.8      Right Median/Ulnar Palm Comparison (Wrist - 8cm)  32 C  Median Palm    1.9 <2.2 25.7 Median Palm Ulnar Palm 0.2   Ulnar Palm    1.7 <2.2 7.4       Electromyography   Side Muscle Ins.Act Fibs Fasc Recrt Amp Dur  Poly Activation Comment  Right 1stDorInt Nml Nml Nml Nml Nml Nml Nml Nml N/A  Right PronatorTeres Nml Nml Nml Nml Nml Nml Nml Nml N/A  Right Biceps Nml Nml Nml *1- *1+ *1+ *1+ Nml N/A  Right Triceps Nml Nml Nml Nml Nml Nml Nml Nml N/A  Right Deltoid Nml Nml Nml *1- *1+ *1+ *1+ Nml N/A  Left 1stDorInt Nml Nml Nml Nml Nml Nml Nml Nml N/A  Left PronatorTeres Nml Nml Nml Nml Nml Nml Nml Nml N/A  Left Biceps Nml Nml Nml *1- *1+ *1+ *1+ Nml N/A  Left Triceps Nml Nml Nml Nml Nml Nml Nml Nml N/A  Left Deltoid Nml Nml Nml *1- *1+ *1+ *1+ Nml N/A      Waveforms:

## 2023-10-29 DIAGNOSIS — M48062 Spinal stenosis, lumbar region with neurogenic claudication: Secondary | ICD-10-CM | POA: Diagnosis not present

## 2023-10-29 DIAGNOSIS — Z6829 Body mass index (BMI) 29.0-29.9, adult: Secondary | ICD-10-CM | POA: Diagnosis not present

## 2023-10-29 DIAGNOSIS — M51362 Other intervertebral disc degeneration, lumbar region with discogenic back pain and lower extremity pain: Secondary | ICD-10-CM | POA: Diagnosis not present

## 2023-11-21 DIAGNOSIS — M65351 Trigger finger, right little finger: Secondary | ICD-10-CM | POA: Diagnosis not present

## 2023-11-21 DIAGNOSIS — M65352 Trigger finger, left little finger: Secondary | ICD-10-CM | POA: Diagnosis not present

## 2023-11-21 DIAGNOSIS — M65331 Trigger finger, right middle finger: Secondary | ICD-10-CM | POA: Diagnosis not present

## 2023-12-12 DIAGNOSIS — M65332 Trigger finger, left middle finger: Secondary | ICD-10-CM | POA: Diagnosis not present

## 2023-12-18 DIAGNOSIS — Z5181 Encounter for therapeutic drug level monitoring: Secondary | ICD-10-CM | POA: Diagnosis not present

## 2023-12-18 DIAGNOSIS — M545 Low back pain, unspecified: Secondary | ICD-10-CM | POA: Diagnosis not present

## 2023-12-18 DIAGNOSIS — I10 Essential (primary) hypertension: Secondary | ICD-10-CM | POA: Diagnosis not present

## 2023-12-18 DIAGNOSIS — I7121 Aneurysm of the ascending aorta, without rupture: Secondary | ICD-10-CM | POA: Diagnosis not present

## 2023-12-18 DIAGNOSIS — Z902 Acquired absence of lung [part of]: Secondary | ICD-10-CM | POA: Diagnosis not present

## 2023-12-18 DIAGNOSIS — R350 Frequency of micturition: Secondary | ICD-10-CM | POA: Diagnosis not present

## 2023-12-18 DIAGNOSIS — Z23 Encounter for immunization: Secondary | ICD-10-CM | POA: Diagnosis not present

## 2023-12-18 DIAGNOSIS — F324 Major depressive disorder, single episode, in partial remission: Secondary | ICD-10-CM | POA: Diagnosis not present

## 2023-12-31 DIAGNOSIS — R142 Eructation: Secondary | ICD-10-CM | POA: Diagnosis not present

## 2023-12-31 DIAGNOSIS — R109 Unspecified abdominal pain: Secondary | ICD-10-CM | POA: Diagnosis not present

## 2023-12-31 DIAGNOSIS — R55 Syncope and collapse: Secondary | ICD-10-CM | POA: Diagnosis not present

## 2023-12-31 DIAGNOSIS — Z23 Encounter for immunization: Secondary | ICD-10-CM | POA: Diagnosis not present

## 2023-12-31 DIAGNOSIS — K5900 Constipation, unspecified: Secondary | ICD-10-CM | POA: Diagnosis not present
